# Patient Record
Sex: Male | Born: 1935 | Race: White | Hispanic: No | Marital: Married | Smoking: Former smoker
Health system: Southern US, Community
[De-identification: ages and names within clinical notes are randomized; demographics above are authoritative.]

## PROBLEM LIST (undated history)

## (undated) DIAGNOSIS — Z853 Personal history of malignant neoplasm of breast: Secondary | ICD-10-CM

## (undated) DIAGNOSIS — E785 Hyperlipidemia, unspecified: Secondary | ICD-10-CM

## (undated) DIAGNOSIS — Z95810 Presence of automatic (implantable) cardiac defibrillator: Secondary | ICD-10-CM

## (undated) DIAGNOSIS — I10 Essential (primary) hypertension: Secondary | ICD-10-CM

## (undated) DIAGNOSIS — I252 Old myocardial infarction: Secondary | ICD-10-CM

## (undated) DIAGNOSIS — I509 Heart failure, unspecified: Secondary | ICD-10-CM

## (undated) DIAGNOSIS — I4891 Unspecified atrial fibrillation: Secondary | ICD-10-CM

## (undated) DIAGNOSIS — M1990 Unspecified osteoarthritis, unspecified site: Secondary | ICD-10-CM

## (undated) DIAGNOSIS — D693 Immune thrombocytopenic purpura: Secondary | ICD-10-CM

## (undated) DIAGNOSIS — IMO0001 Reserved for inherently not codable concepts without codable children: Secondary | ICD-10-CM

## (undated) DIAGNOSIS — I2589 Other forms of chronic ischemic heart disease: Secondary | ICD-10-CM

## (undated) DIAGNOSIS — I2510 Atherosclerotic heart disease of native coronary artery without angina pectoris: Secondary | ICD-10-CM

## (undated) DIAGNOSIS — Z1501 Genetic susceptibility to malignant neoplasm of breast: Secondary | ICD-10-CM

## (undated) DIAGNOSIS — C50919 Malignant neoplasm of unspecified site of unspecified female breast: Secondary | ICD-10-CM

## (undated) DIAGNOSIS — Z1509 Genetic susceptibility to other malignant neoplasm: Principal | ICD-10-CM

## (undated) HISTORY — DX: Malignant neoplasm of unspecified site of unspecified female breast: C50.919

## (undated) HISTORY — PX: HIP ARTHROPLASTY: SHX981

## (undated) HISTORY — PX: CORONARY ARTERY BYPASS GRAFT: SHX141

## (undated) HISTORY — DX: Essential (primary) hypertension: I10

## (undated) HISTORY — PX: JOINT REPLACEMENT: SHX530

## (undated) HISTORY — PX: MASTECTOMY, PARTIAL: SHX709

## (undated) HISTORY — DX: Genetic susceptibility to other malignant neoplasm: Z15.09

## (undated) HISTORY — PX: CARDIAC CATHETERIZATION: SHX172

## (undated) HISTORY — DX: Immune thrombocytopenic purpura: D69.3

## (undated) HISTORY — PX: EYE SURGERY: SHX253

## (undated) HISTORY — DX: Genetic susceptibility to malignant neoplasm of breast: Z15.01

## (undated) HISTORY — DX: Heart failure, unspecified: I50.9

---

## 1997-09-13 ENCOUNTER — Other Ambulatory Visit: Admission: RE | Admit: 1997-09-13 | Discharge: 1997-09-13 | Payer: Self-pay | Admitting: Otolaryngology

## 1997-12-09 ENCOUNTER — Ambulatory Visit (HOSPITAL_COMMUNITY): Admission: RE | Admit: 1997-12-09 | Discharge: 1997-12-09 | Payer: Self-pay | Admitting: Gastroenterology

## 1999-02-27 ENCOUNTER — Encounter: Admission: RE | Admit: 1999-02-27 | Discharge: 1999-05-28 | Payer: Self-pay | Admitting: Cardiology

## 2000-10-31 ENCOUNTER — Encounter (HOSPITAL_COMMUNITY): Admission: RE | Admit: 2000-10-31 | Discharge: 2000-11-30 | Payer: Self-pay | Admitting: Oncology

## 2000-10-31 ENCOUNTER — Encounter: Admission: RE | Admit: 2000-10-31 | Discharge: 2000-10-31 | Payer: Self-pay | Admitting: Oncology

## 2001-03-29 ENCOUNTER — Encounter: Payer: Self-pay | Admitting: Orthopedic Surgery

## 2001-03-29 ENCOUNTER — Inpatient Hospital Stay (HOSPITAL_COMMUNITY): Admission: RE | Admit: 2001-03-29 | Discharge: 2001-04-03 | Payer: Self-pay | Admitting: Orthopedic Surgery

## 2001-08-15 ENCOUNTER — Encounter: Admission: RE | Admit: 2001-08-15 | Discharge: 2001-08-15 | Payer: Self-pay | Admitting: Oncology

## 2001-08-15 ENCOUNTER — Encounter (HOSPITAL_COMMUNITY): Admission: RE | Admit: 2001-08-15 | Discharge: 2001-09-14 | Payer: Self-pay | Admitting: Oncology

## 2001-11-06 ENCOUNTER — Encounter (HOSPITAL_COMMUNITY): Admission: RE | Admit: 2001-11-06 | Discharge: 2001-12-06 | Payer: Self-pay | Admitting: Oncology

## 2001-11-06 ENCOUNTER — Encounter: Admission: RE | Admit: 2001-11-06 | Discharge: 2001-11-06 | Payer: Self-pay | Admitting: Oncology

## 2002-01-17 ENCOUNTER — Inpatient Hospital Stay (HOSPITAL_COMMUNITY): Admission: RE | Admit: 2002-01-17 | Discharge: 2002-01-21 | Payer: Self-pay | Admitting: Orthopedic Surgery

## 2002-01-17 ENCOUNTER — Encounter: Payer: Self-pay | Admitting: Orthopedic Surgery

## 2002-06-04 ENCOUNTER — Encounter: Admission: RE | Admit: 2002-06-04 | Discharge: 2002-06-04 | Payer: Self-pay | Admitting: Oncology

## 2002-06-04 ENCOUNTER — Encounter (HOSPITAL_COMMUNITY): Admission: RE | Admit: 2002-06-04 | Discharge: 2002-07-04 | Payer: Self-pay | Admitting: Oncology

## 2002-12-10 ENCOUNTER — Encounter: Admission: RE | Admit: 2002-12-10 | Discharge: 2002-12-10 | Payer: Self-pay | Admitting: Oncology

## 2002-12-10 ENCOUNTER — Encounter (HOSPITAL_COMMUNITY): Admission: RE | Admit: 2002-12-10 | Discharge: 2002-12-20 | Payer: Self-pay | Admitting: Oncology

## 2002-12-12 ENCOUNTER — Encounter (HOSPITAL_COMMUNITY): Payer: Self-pay | Admitting: Oncology

## 2002-12-12 ENCOUNTER — Encounter: Admission: RE | Admit: 2002-12-12 | Discharge: 2002-12-12 | Payer: Self-pay | Admitting: Oncology

## 2002-12-31 ENCOUNTER — Ambulatory Visit (HOSPITAL_COMMUNITY): Admission: RE | Admit: 2002-12-31 | Discharge: 2002-12-31 | Payer: Self-pay | Admitting: Gastroenterology

## 2003-12-10 ENCOUNTER — Encounter (HOSPITAL_COMMUNITY): Admission: RE | Admit: 2003-12-10 | Discharge: 2003-12-20 | Payer: Self-pay | Admitting: Oncology

## 2003-12-10 ENCOUNTER — Encounter: Admission: RE | Admit: 2003-12-10 | Discharge: 2003-12-20 | Payer: Self-pay | Admitting: Oncology

## 2004-12-08 ENCOUNTER — Ambulatory Visit (HOSPITAL_COMMUNITY): Payer: Self-pay | Admitting: Oncology

## 2004-12-08 ENCOUNTER — Encounter: Admission: RE | Admit: 2004-12-08 | Discharge: 2004-12-19 | Payer: Self-pay | Admitting: Oncology

## 2004-12-08 ENCOUNTER — Encounter (HOSPITAL_COMMUNITY): Admission: RE | Admit: 2004-12-08 | Discharge: 2004-12-19 | Payer: Self-pay | Admitting: Oncology

## 2004-12-30 ENCOUNTER — Encounter: Admission: RE | Admit: 2004-12-30 | Discharge: 2004-12-30 | Payer: Self-pay | Admitting: Oncology

## 2005-02-26 ENCOUNTER — Ambulatory Visit: Payer: Self-pay | Admitting: Physical Medicine & Rehabilitation

## 2005-02-26 ENCOUNTER — Inpatient Hospital Stay (HOSPITAL_COMMUNITY): Admission: RE | Admit: 2005-02-26 | Discharge: 2005-03-06 | Payer: Self-pay | Admitting: Orthopedic Surgery

## 2005-03-04 ENCOUNTER — Ambulatory Visit: Payer: Self-pay | Admitting: Oncology

## 2005-05-31 ENCOUNTER — Ambulatory Visit (HOSPITAL_COMMUNITY): Payer: Self-pay | Admitting: Oncology

## 2005-05-31 ENCOUNTER — Encounter: Admission: RE | Admit: 2005-05-31 | Discharge: 2005-05-31 | Payer: Self-pay | Admitting: Oncology

## 2005-05-31 ENCOUNTER — Encounter (HOSPITAL_COMMUNITY): Admission: RE | Admit: 2005-05-31 | Discharge: 2005-06-30 | Payer: Self-pay | Admitting: Oncology

## 2006-01-31 ENCOUNTER — Encounter: Admission: RE | Admit: 2006-01-31 | Discharge: 2006-01-31 | Payer: Self-pay | Admitting: Oncology

## 2006-01-31 ENCOUNTER — Ambulatory Visit (HOSPITAL_COMMUNITY): Payer: Self-pay | Admitting: Oncology

## 2006-01-31 ENCOUNTER — Encounter (HOSPITAL_COMMUNITY): Admission: RE | Admit: 2006-01-31 | Discharge: 2006-03-02 | Payer: Self-pay | Admitting: Oncology

## 2006-03-18 ENCOUNTER — Encounter (HOSPITAL_COMMUNITY): Admission: RE | Admit: 2006-03-18 | Discharge: 2006-03-21 | Payer: Self-pay | Admitting: Oncology

## 2006-05-11 ENCOUNTER — Ambulatory Visit (HOSPITAL_COMMUNITY): Payer: Self-pay | Admitting: Oncology

## 2006-05-11 ENCOUNTER — Encounter (HOSPITAL_COMMUNITY): Admission: RE | Admit: 2006-05-11 | Discharge: 2006-06-10 | Payer: Self-pay | Admitting: Oncology

## 2007-01-30 ENCOUNTER — Ambulatory Visit (HOSPITAL_COMMUNITY): Payer: Self-pay | Admitting: Oncology

## 2007-04-04 ENCOUNTER — Inpatient Hospital Stay (HOSPITAL_COMMUNITY): Admission: AD | Admit: 2007-04-04 | Discharge: 2007-04-07 | Payer: Self-pay | Admitting: Cardiology

## 2007-04-05 ENCOUNTER — Encounter (INDEPENDENT_AMBULATORY_CARE_PROVIDER_SITE_OTHER): Payer: Self-pay | Admitting: Cardiology

## 2007-06-01 ENCOUNTER — Ambulatory Visit (HOSPITAL_COMMUNITY): Admission: RE | Admit: 2007-06-01 | Discharge: 2007-06-01 | Payer: Self-pay | Admitting: Cardiology

## 2007-07-20 ENCOUNTER — Ambulatory Visit (HOSPITAL_COMMUNITY): Admission: RE | Admit: 2007-07-20 | Discharge: 2007-07-20 | Payer: Self-pay | Admitting: Cardiology

## 2007-07-24 ENCOUNTER — Encounter: Admission: RE | Admit: 2007-07-24 | Discharge: 2007-07-24 | Payer: Self-pay | Admitting: Cardiology

## 2007-09-28 ENCOUNTER — Inpatient Hospital Stay (HOSPITAL_BASED_OUTPATIENT_CLINIC_OR_DEPARTMENT_OTHER): Admission: RE | Admit: 2007-09-28 | Discharge: 2007-09-28 | Payer: Self-pay | Admitting: Cardiology

## 2007-10-27 ENCOUNTER — Ambulatory Visit: Payer: Self-pay | Admitting: Internal Medicine

## 2007-11-15 ENCOUNTER — Ambulatory Visit: Payer: Self-pay | Admitting: Internal Medicine

## 2007-11-15 LAB — CONVERTED CEMR LAB
BUN: 29 mg/dL — ABNORMAL HIGH (ref 6–23)
CO2: 30 meq/L (ref 19–32)
Calcium: 8.6 mg/dL (ref 8.4–10.5)
Eosinophils Relative: 2.9 % (ref 0.0–5.0)
GFR calc Af Amer: 51 mL/min
Glucose, Bld: 123 mg/dL — ABNORMAL HIGH (ref 70–99)
HCT: 35.5 % — ABNORMAL LOW (ref 39.0–52.0)
Hemoglobin: 12.2 g/dL — ABNORMAL LOW (ref 13.0–17.0)
INR: 2.1 — ABNORMAL HIGH (ref 0.8–1.0)
Lymphocytes Relative: 17.5 % (ref 12.0–46.0)
Monocytes Absolute: 0.5 10*3/uL (ref 0.1–1.0)
Monocytes Relative: 9.2 % (ref 3.0–12.0)
Neutro Abs: 3.6 10*3/uL (ref 1.4–7.7)
Prothrombin Time: 22.9 s — ABNORMAL HIGH (ref 10.9–13.3)
RBC: 3.55 M/uL — ABNORMAL LOW (ref 4.22–5.81)
Sodium: 144 meq/L (ref 135–145)
WBC: 5.1 10*3/uL (ref 4.5–10.5)

## 2007-11-22 ENCOUNTER — Inpatient Hospital Stay (HOSPITAL_COMMUNITY): Admission: RE | Admit: 2007-11-22 | Discharge: 2007-11-23 | Payer: Self-pay | Admitting: Internal Medicine

## 2007-11-22 ENCOUNTER — Ambulatory Visit: Payer: Self-pay | Admitting: Internal Medicine

## 2007-12-14 ENCOUNTER — Ambulatory Visit: Payer: Self-pay | Admitting: Internal Medicine

## 2007-12-14 ENCOUNTER — Ambulatory Visit: Payer: Self-pay

## 2008-04-30 ENCOUNTER — Encounter: Payer: Self-pay | Admitting: Internal Medicine

## 2008-05-14 ENCOUNTER — Ambulatory Visit: Payer: Self-pay | Admitting: Internal Medicine

## 2008-11-21 DIAGNOSIS — I10 Essential (primary) hypertension: Secondary | ICD-10-CM | POA: Insufficient documentation

## 2008-11-21 DIAGNOSIS — I2589 Other forms of chronic ischemic heart disease: Secondary | ICD-10-CM | POA: Insufficient documentation

## 2008-11-21 DIAGNOSIS — I4891 Unspecified atrial fibrillation: Secondary | ICD-10-CM | POA: Insufficient documentation

## 2008-11-21 DIAGNOSIS — I5022 Chronic systolic (congestive) heart failure: Secondary | ICD-10-CM

## 2008-11-21 DIAGNOSIS — M1990 Unspecified osteoarthritis, unspecified site: Secondary | ICD-10-CM | POA: Insufficient documentation

## 2008-11-21 DIAGNOSIS — M109 Gout, unspecified: Secondary | ICD-10-CM

## 2008-11-22 ENCOUNTER — Ambulatory Visit: Payer: Self-pay | Admitting: Internal Medicine

## 2008-11-25 DIAGNOSIS — Z95810 Presence of automatic (implantable) cardiac defibrillator: Secondary | ICD-10-CM

## 2008-11-25 HISTORY — DX: Presence of automatic (implantable) cardiac defibrillator: Z95.810

## 2009-12-23 ENCOUNTER — Ambulatory Visit: Payer: Self-pay | Admitting: Internal Medicine

## 2010-03-26 ENCOUNTER — Ambulatory Visit: Admit: 2010-03-26 | Payer: Self-pay | Admitting: Internal Medicine

## 2010-03-31 ENCOUNTER — Encounter (INDEPENDENT_AMBULATORY_CARE_PROVIDER_SITE_OTHER): Payer: Self-pay | Admitting: *Deleted

## 2010-04-21 NOTE — Assessment & Plan Note (Signed)
Summary: df2   Visit Type:  follow-up Primary Provider:  Pete Glatter, MD   History of Present Illness: Martin [name] returns today for ICD followup.  He is a 77 yo man with a h/o chronic atrial fibrillation and an ICM.  He has class 1-2 CHF and is s/p prophylactic ICD implantation.  He denies c/p or sob.  He is able to walk up multiple flights of stairs without much difficulty.  He denies c/p, peripheral edema or any intercurrent ICD shocks.  Back in May his optivol was up and he notes that his diuretic dose was adjusted.  Current Medications (verified): 1)  Potassium Chloride Crys Cr 20 Meq Cr-Tabs (Potassium Chloride Crys Cr) .... Take One Tablet By Mouth Daily 2)  Allopurinol 300 Mg Tabs (Allopurinol) .Marland Kitchen.. 1 By Mouth Once Daily 3)  Crestor 20 Mg Tabs (Rosuvastatin Calcium) .... Take One Tablet By Mouth Daily. 4)  Colchicine 0.6 Mg Tabs (Colchicine) .Marland Kitchen.. 1 By Mouth Once Daily 5)  Furosemide 40 Mg Tabs (Furosemide) .Marland Kitchen.. 1 Tab By Mouth Two Times A Day 6)  Aspirin 81 Mg Tbec (Aspirin) .... Take One Tablet By Mouth Daily 7)  Metoprolol Tartrate 50 Mg Tabs (Metoprolol Tartrate) .... Take One Tablet By Mouth Twice A Day 8)  Terazosin Hcl 5 Mg Caps (Terazosin Hcl) .Marland Kitchen.. 1 By Mouth Once Daily 9)  Warfarin Sodium 5 Mg Tabs (Warfarin Sodium) .... Use As Directed By Anticoagulation Clinic 10)  Vitamin C 500 Mg Tabs (Ascorbic Acid) .Marland Kitchen.. 1 By Mouth Once Daily 11)  Ferrous Sulfate 324 Mg Tabs (Ferrous Sulfate) .Marland Kitchen.. 1 By Mouth Once Daily 12)  Multivitamins  Tabs (Multiple Vitamin) .Marland Kitchen.. 1 By Mouth Once Daily 13)  Diovan 160 Mg Tabs (Valsartan) .... Take One Tablet By Mouth Daily  Allergies (verified): No Known Drug Allergies  Past History:  Past Medical History: Last updated: 11/21/2008 HYPERTENSION (ICD-401.9) GOUT (ICD-274.9) OSTEOARTHRITIS (ICD-715.90) ATRIAL FIBRILLATION, CHRONIC (ICD-427.31) CHRONIC SYSTOLIC HEART FAILURE (ICD-428.22) CARDIOMYOPATHY, ISCHEMIC (ICD-414.8) CORONARY ARTERY BYPASS  GRAFT, HX OF (ICD-V45.81) MYOCARDIAL INFARCTION, HX OF (ICD-412)  Past Surgical History: Last updated: 11/21/2008 triple bypass in June 1985. Radical mastectomy of the left breast in 1991. Angioplasty in 1995. Right total knee arthroplasty in January 2003. Left total knee arthroplasty in October 2003.  Review of Systems  The patient denies chest pain, syncope, dyspnea on exertion, and peripheral edema.    Vital Signs:  Patient profile:   77 year old male Height:      70 inches Weight:      215 pounds BMI:     30.96 Pulse rate:   77 / minute BP sitting:   120 / 82  (left arm)  Vitals Entered By: Laurance Flatten CMA (December 23, 2009 10:57 AM)  Physical Exam  General:  Well developed, well nourished, in no acute distress. Head:  normocephalic and atraumatic Eyes:  PERRLA/EOM intact; conjunctiva and lids normal. Mouth:  Teeth, gums and palate normal. Oral mucosa normal. Neck:  Neck supple, no JVD. No masses, thyromegaly or abnormal cervical nodes. Chest Wall:  Well healed ICD incision. Lungs:  Clear bilaterally to auscultation with no wheezes, rales, or rhonchi. Heart:  IRIR with normal S1 and S2.  PMI is enlarged and laterally displaced.  No murmurs were appreciated. Abdomen:  Bowel sounds positive; abdomen soft and non-tender without masses, organomegaly, or hernias noted. No hepatosplenomegaly. Msk:  Back normal, normal gait. Muscle strength and tone normal. Pulses:  pulses normal in all 4 extremities Extremities:  No clubbing  or cyanosis. Neurologic:  Alert and oriented x 3.    ICD Specifications Following MD:  Lewayne Bunting, MD     ICD Vendor:  Medtronic     ICD Model Number:  D154VWC     ICD Serial Number:  [id number] ICD DOI:  11/22/2007      Lead 1:    Location: RV     DOI: 11/22/2007     Model #: [id number]     Serial #: [id number]     Status: active  Indications::  ICM   ICD Follow Up Battery Voltage:  3.19 V     Charge Time:  8.9 seconds     Underlying rhythm:   SR   ICD Device Measurements Right Ventricle:  Amplitude: 18.6 mV, Impedance: 408 ohms, Threshold: 1.0 V at 0.4 msec Shock Impedance: 64 ohms   Episodes MS Episodes:  0     Coumadin:  Yes Shock:  0     ATP:  0     Nonsustained:  0     Atrial Therapies:  0 Ventricular Pacing:  <0.1%  Brady Parameters Mode VVI     Lower Rate Limit:  40      Tachy Zones VF:  200     VT:  150     Next Remote Date:  03/26/2010     Next Cardiology Appt Due:  12/21/2010 Tech Comments:  NORMAL DEVICE FUNCTION.  NO EPISODES SINCE LAST CHECK.  OPTIVOL INCREASE FROM 4-16 THRU 6-3.  CHANGED RV OUTPUT FROM 3.0 TO 2.5 AND CHANGED MAX LEAD IMPEDANCE FOR LIA.  CARELINK TRANSMISSION 03-26-10. ROV IN 12 MTHS W/GT. Vella Kohler  December 23, 2009 11:02 AM MD Comments:  Agree with above.  Impression & Recommendations:  Problem # 1:  AUTOMATIC IMPLANTABLE CARDIAC DEFIBRILLATOR SITU (ICD-V45.02) His device is working normally.  Will recheck in several months.  Problem # 2:  ATRIAL FIBRILLATION, CHRONIC (ICD-427.31) He is asymptomatic and his ventricular rate is controlled.  He will continue meds as below. His updated medication list for this problem includes:    Aspirin 81 Mg Tbec (Aspirin) .Marland Kitchen... Take one tablet by mouth daily    Metoprolol Tartrate 50 Mg Tabs (Metoprolol tartrate) .Marland Kitchen... Take one tablet by mouth twice a day    Warfarin Sodium 5 Mg Tabs (Warfarin sodium) ..... Use as directed by anticoagulation clinic  Problem # 3:  CHRONIC SYSTOLIC HEART FAILURE (ICD-428.22) His symptoms appear to be well controlled. A low sodium diet is requested. His updated medication list for this problem includes:    Furosemide 40 Mg Tabs (Furosemide) .Marland Kitchen... 1 tab by mouth two times a day    Aspirin 81 Mg Tbec (Aspirin) .Marland Kitchen... Take one tablet by mouth daily    Metoprolol Tartrate 50 Mg Tabs (Metoprolol tartrate) .Marland Kitchen... Take one tablet by mouth twice a day    Warfarin Sodium 5 Mg Tabs (Warfarin sodium) ..... Use as directed by  anticoagulation clinic    Diovan 160 Mg Tabs (Valsartan) .Marland Kitchen... Take one tablet by mouth daily  Patient Instructions: 1)  Your physician wants you to follow-up in: 12 months with Dr Court Joy will receive a reminder letter in the mail two months in advance. If you don't receive a letter, please call our office to schedule the follow-up appointment. 2)  03/26/10-  [facility] transmission

## 2010-04-21 NOTE — Cardiovascular Report (Signed)
Summary: Office Visit   Office Visit   Imported By: Roderic Ovens 12/23/2009 14:30:59  _____________________________________________________________________  External Attachment:    Type:   Image     Comment:   External Document

## 2010-04-23 NOTE — Letter (Signed)
Summary: Device-Delinquent Phone Journalist, newspaper, Main Office  [address]   [place], [place] [address]   Phone: [phone]  Fax: [phone]     March 31, 2010 MRN: [id number]   [employer] [address] [place], [place]  [address]   Dear Mr. Nicoll,  According to our records, you were scheduled for a device phone transmission on 03-26-2010.     We did not receive any results from this check.  If you transmitted on your scheduled day, please call us to help troubleshoot your system.  If you forgot to send your transmission, please send one upon receipt of this letter.  Thank you,   Architectural technologist [facility]

## 2010-08-04 NOTE — Op Note (Signed)
NAME:  Martin [name], Martin [name] NO.:  [id number]   MEDICAL RECORD NO.:  [id number]          PATIENT TYPE:  OIB   LOCATION:  2854                         FACILITY:  [facility]   PHYSICIAN:  Georga Hacking, M.D.DATE OF BIRTH:  09/18/1930   DATE OF PROCEDURE:  DATE OF DISCHARGE:                               OPERATIVE REPORT   PROCEDURE:  Cardioversion.   TECHNIQUE:  The patient was brought to the [facility] and he had  previously been on warfarin anticoagulation for greater than 1 month  with INR greater than 2.  Cardioversion was done with synchronized  biphasic defibrillation with reversion to sinus rhythm after 2 shocks.  Anesthesia was administered by Dr. Arta Bruce with 175 mg of Pentothal.  He converted to sinus with marked first degree AV block, right bundle  branch block, and left posterior hemiblock.  He tolerated the procedure  well.   IMPRESSION:  Successful cardioversion of atrial fibrillation following  failed attempts to hold in sinus rhythm previously on Tikosyn.  The  patient will be maintained on amiodarone.      Georga Hacking, M.D.  Electronically Signed     WST/MEDQ  D:  07/20/2007  T:  07/20/2007  Job:  [id number]   cc:   Hal T. Stoneking, M.D.

## 2010-08-04 NOTE — Discharge Summary (Signed)
NAME:  DANNA, CASELLA NO.:  [id number]   MEDICAL RECORD NO.:  [id number]          PATIENT TYPE:  INP   LOCATION:  3741                         FACILITY:  [facility]   PHYSICIAN:  Doylene Canning. Ladona Ridgel, MD    DATE OF BIRTH:  [birth year]   DATE OF ADMISSION:  11/22/2007  DATE OF DISCHARGE:  11/23/2007                               DISCHARGE SUMMARY   CARDIOLOGIST:  Georga Hacking, MD   PRIMARY CAREGIVER:  Hal T. Pete Glatter, MD   ONCOLOGIST:  Ladona Horns. Mariel Sleet, MD   ELECTROPHYSIOLOGIST:  Doylene Canning. Ladona Ridgel, MD   This patient has no known drug allergies.   TIME FOR THIS DICTATION:  Greater than 35 minutes.   FINAL DIAGNOSIS:  Discharged on day #1, status post implant of a  Medtronic VIRTUOSO-VR single chamber cardioverter defibrillator.  Defibrillator threshold study less than or equal to 20 joules, Dr. Lewayne Bunting.   SECONDARY DIAGNOSES:  1. History of anterior myocardial infarction, 1985.  Subsequent left      internal mammary artery graft to the left anterior descending      coronary artery, 1985.      a.     Stents to the right coronary artery, 1999.  2. Cardiomyopathy, ischemic with right bundle branch block.      a.     Catheterization, September 28, 2007, ejection fraction 20%-25%,       which shows a decline.  The left internal mammary artery  is       patent 30% in-stent restenosis in the right coronary artery.  3. New York Heart Association class II chronic systolic congestive      heart failure.  4. Chronic atrial fibrillation on Coumadin.      a.     Failed cardioversion on Tikosyn.      b.     Failed cardioversion on amiodarone.  5. Osteoarthritis.      a.     Status post bilateral total knee arthroplasties.      b.     Status post left total hip arthroplasty.  6. Left mastectomy for breast cancer.  7. Gout.  8. Hypertension.   PROCEDURE:  November 22, 2007 implant of the Medtronic VIRTUOSO  cardioverter-defibrillator.  This patient was admitted from  November 22, 2007 to November 23, 2007.   BRIEF HISTORY:  Mr. Tungate now is a 77 year old male referred by Dr.  Donnie Aho for evaluation of atrial fibrillation and consideration of ICD.   The patient has a longstanding history of coronary artery disease.  He  had myocardial infarction in 1985 with subsequent coronary artery bypass  graft surgery.  He recently had a catheterization in July 2009, which  showed ejection fraction of 20%-25%, which represents a decline in his  ejection fraction.  He has heart failure class II.  His activity is  limited mostly by osteoarthritis in the knees and in hip.   He is still able to push a lawnmower and riding mower without particular  difficulty  Her has never had syncope.   With respect to the patient's atrial  fibrillation, Dr. Ladona Ridgel  recommended Dronedarone, a multiclass antiarrhythmic drug.  He thought  this would be a consideration since chronic atrial fibrillation will  probably not respond to aggressive attempts for sinus rhythm  maintenance.  With regard to prophylactic protection against sudden  cardiac death, the patient has no indication for cardioverter-  defibrillator.  The plan for this patient would be to continue on  Coumadin throughout the days leading to and through the procedure.  The  risks and benefits of implantation of this device have been described to  the patient who wishes to proceed.   HOSPITAL COURSE:  The patient presents electively on November 22, 2007.  He underwent implantation of a single chamber device the same day.  He  had defibrillator threshold study, which was less than or equal to 20  joules and was effective in eliminating ventricular fibrillation, which  had been initiated in the Electrophysiology Lab by Dr. Ladona Ridgel.  The  patient has maintained on his Coumadin throughout this hospitalization.  The incision looks well with no hematoma.  On postprocedure day #1, the  chest x-ray showed the lead is in  appropriate position.  The device has  been interrogated and the values were all within normal limits.  The  patient is ready for discharge.   He hoes home on his regular medications.  They are:  1. Klor-Con 20 mEq daily.  2. Allopurinol 300 mg daily.  3. Crestor 20 mg daily at bedtime.  4. Colchicine 0.6 mg daily.  5. Lasix 40 mg daily.  6. Diovan 160 mg daily.  7. Enteric-coated aspirin 81 mg daily.  8. Metoprolol succinate 50 mg daily.  9. Terazosin 5 mg daily at night.  10.Coumadin 5 mg daily except for 8 mg on Mondays.   The patient has been asked to keep his incision dry for the next 7 days  until Wednesday, November 29, 2007.  Mobility of the left arm has been  discussed with the patient.  He has a followup at the [facility],  Wednesday, December 13, 2007 at 9:40.   Laboratory studies pertinent to this admission were drawn on November 15, 2007.  White cells 5.1, hemoglobin 12.2, hematocrit 35.5, platelets 103,  protime was 22.9, at that time INR 2.1.  Sodium 144, potassium is 4,  chloride 109, carbon dioxide or carbonate 30, glucose 123, BUN is 29,  creatinine 1.7.      Maple Mirza, PA      Doylene Canning. Ladona Ridgel, MD  Electronically Signed    GM/MEDQ  D:  11/23/2007  T:  11/24/2007  Job:  [id number]   cc:   Georga Hacking, M.D.  Hal T. Stoneking, M.D.  Ladona Horns. Mariel Sleet, MD  Doylene Canning. Ladona Ridgel, MD

## 2010-08-04 NOTE — Assessment & Plan Note (Signed)
[facility]                         ELECTROPHYSIOLOGY OFFICE NOTE   NAME:Martin [name]                       MRN:          [id number]  DATE:05/14/2008                            DOB:          1930-07-01    Martin [name] returns today for followup.  He is a very pleasant elderly male  with chronic atrial fibrillation and ischemic cardiomyopathy in class I  to II congestive heart failure who underwent ICD implantation back in  September.  At that time, he had a Medtronic Virtuoso placed and he  returns today for followup.  He had no specific complaints today.  He  notes that he would like to have more energy, but realizes that he is  not as young as he use to be and that he has a weakened heart and AFib.   MEDICINES:  1. Potassium 20 a day.  2. Allopurinol 300 a day.  3. Crestor 20 a day.  4. Colchicine 0.6 a day.  5. Furosemide 40 twice a day.  6. Aspirin 81 daily.  7. Metoprolol 50 a day.  8. Terazosin 5 mg daily.  9. Warfarin 5 mg daily.  10.Multiple vitamins.   PHYSICAL EXAMINATION:  GENERAL:  On exam, he is a pleasant, well-  appearing man, in no acute distress.  VITAL SIGNS:  Blood pressure was 110/73, the pulse was 59 and irregular,  respirations were 18.  The weight was 216 pounds.  NECK:  No jugular  venous distention.  LUNGS:  Clear bilaterally to auscultation.  No wheezes, rales, or  rhonchi are present.  No increased work of breathing.  CARDIOVASCULAR:  Irregular rhythm with normal S1 and S2.  The PMI was  enlarged and laterally displaced.  ABDOMEN:  Soft and nontender.  EXTREMITIES:  Demonstrated no peripheral edema.   Interrogation of his defibrillator demonstrates Medtronic Virtuoso.  The  R-waves are 18.  The impedance was 400.  The threshold volt 0.4.  Battery voltage was 3.2 volts.  Underlying rhythm was AFib at 60.   IMPRESSION:  1. Ischemic cardiomyopathy, ejection fraction 25%.  2. Congestive heart failure, class II.  3.  Atrial fibrillation.  4. Status post implantable cardioverter-defibrillator insertion.   DISCUSSION:  Overall, Martin [name] is stable and his defibrillator is  working normally.  He is maintaining atrial fibrillation which he will  likely do indefinitely, as he has failed antiarrhythmic drug therapy in  the past.  I will plan to see the patient back in the office for  followup of his ICD.  He is instructed to maintain a low-salt diet.  No  medicine changes were made today.     Doylene Canning. Ladona Ridgel, MD  Electronically Signed    GWT/MedQ  DD: 05/14/2008  DT: 05/15/2008  Job #: [id number]   cc:   Georga Hacking, M.D.

## 2010-08-04 NOTE — Op Note (Signed)
NAME:  Martin [name], Martin [name] NO.:  [id number]   MEDICAL RECORD NO.:  [id number]          PATIENT TYPE:  INP   LOCATION:  3741                         FACILITY:  [facility]   PHYSICIAN:  Doylene Canning. Ladona Ridgel, MD    DATE OF BIRTH:  02/20/1931   DATE OF PROCEDURE:  DATE OF DISCHARGE:                               OPERATIVE REPORT   PREPROCEDURE DIAGNOSES:  1. Ischemic cardiomyopathy.  2. New York Heart Association class II heart failure.  3. Chronic atrial fibrillation.   Postprocedure Diagnosis  Same   PROCEDURES:  1. Implantable cardioverter-defibrillator implantation.  2. Defibrillation threshold testing  3. Contrast venography.   DESCRIPTION OF THE PROCEDURE:  Informed written consent was obtained and  the patient was brought to the electrophysiology lab in a fasting state.  He was adequately sedative with intravenous medications as outlined in  the nursing report.  The patient's left chest was prepped and draped in  the usual sterile fashion by the EP lab staff.  A 6-cm incision was made  over the left deltopectoral region.  Using a combination of sharp and  blunt dissection, an ICD pocket was fashioned in the standard manner.  Careful attention was used to maintain hemostasis throughout.  A  venogram of the left upper extremity was then performed by hand  injection of nonionic contrast, which demonstrated a patent left  axillary and subclavian vein.  The left axillary vein was cannulated  using a modified percutaneous Seldinger technique and a 9-French  SafeSheath was introduced into the left axillary vein.  A Medtronic  Sprint Quattro Secure S model [id number] (serial # [id number]) lead was  advanced through this vein into the low right ventricular septum near  the apex and actively fixed in this location.  Lead measurements  revealed an R-wave of 23.7 mV with an impedance 713 ohms and a pacing  threshold of 0.9 volts at 0.5 msec.  The lead was then secured to the  pectoralis fascia with 0 silk suture over the suture sleeve.  The lead  was then connected to a Medtronic Virtuoso VR model D15VWC (serial #  [id number]) single chamber ICD.  The pocket was then copiously irrigated  with a gentamicin solution.  The ICD pocket was then closed in 2 layers  with Vicryl 2.0 suture used for the subcutaneous and subcuticular  layers.  Steri-Strips and a sterile bandages were then applied.  DFT  testing was then performed.  Ventricular fibrillation was induced with 2  shocks.  Adequate sensing of ventricular fibrillation was observed from  the device with a sensitivity program to 1.2 mV.  A 10-joule shock was  delivered from the device, which failed to defibrillate the patient.  A  20-joule shock was then delivered from the device, which successfully  defibrillated the patient.  The impedance was 62 ohms with a duration of  7 seconds.  The patient remained in atrial fibrillation.  There were no  early apparent complications.   CONCLUSIONS:  1. Ischemic cardiomyopathy.  2. Congestive heart failure with New York Heart Association class II  heart failure symptoms.  3. Successful single chamber ICD implantation.  4. DFT less than or equal to 20 joules.  5. No early apparent complications.      Doylene Canning. Ladona Ridgel, MD  Electronically Signed     GWT/MEDQ  D:  11/22/2007  T:  11/23/2007  Job:  [id number]   cc:   Georga Hacking, M.D.

## 2010-08-04 NOTE — Assessment & Plan Note (Signed)
HEALTHCARE                         ELECTROPHYSIOLOGY OFFICE NOTE   NAME:BALLDalbert, Stillings                       MRN:          [id number]  DATE:10/27/2007                            DOB:          May 05, 1930    [name] is referred today by Dr. Viann Fish for evaluation of  atrial fibrillation and consideration for ICD implantation.  The patient  is a very pleasant 77 year old man with a longstanding history of  coronary artery disease status post myocardial infarction in 1985.  He  has had known LV dysfunction with EF in the 40-45% range, but recently  EF was found to be down in the 25-30% range.  This is by  catheterization.  He is referred for this evaluation.  His heart failure  is class II.  He is limited mostly by arthritis and knee replacements in  his knees bilaterally as well as one hip.  He did not have angina.  He  has rare palpitations.  He does have some mild dyspnea.  The patient  does too much strenuous activity like mowing his grass.  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  He has never had frank syncope.   His medicines include potassium 20 a day, allopurinol 300 a day, Crestor  20 a day, colchicine 0.6 daily, furosemide 40 twice daily, aspirin 81  daily, metoprolol 50 daily, terazosin 5 daily, warfarin daily, and  multiple vitamins.  The patient has not been tolerant of ACE inhibitors  in the past.  His family history is noncontributory.   SOCIAL HISTORY:  The patient is married.  He denies tobacco or ethanol  abuse.  He stopped smoking in 1966.  He drinks approximately 4 ounces of  alcohol per day.   Past medical history is notable for a bypass surgery in June 1985.  He  has a history of mastectomy on the left secondary to breast cancer.  He  has a history of knee replacement.  He has a history of hip replacement.  He has a history of arthritis and gout.  His  family history is notable  for mother died in her 36s as well as her father.  The mother had  cancer.  The father with heart failure.   His review of systems is as noted in the HPI, otherwise all symptoms are  negative except for some dysuria problems which are very mild.   On physical examination, he is a pleasant well-appearing 77 year old man  in no acute distress.  The initial blood pressure was 134/80.  The pulse  was 82 and regular.  The respirations were 18.  The weight was 211  pounds.  HEENT exam was normocephalic and atraumatic.  Pupils equal and  round.  The oropharynx was moist.  Sclerae anicteric.  Neck revealed no  jugular venous distention.  There was no thyromegaly.  The trachea was  midline.  The carotids were 2+ and symmetric.  The lungs were clear  bilaterally on auscultation.  No wheezes, rales, or rhonchi were  present.  There was no increased work of breathing.  The cardiovascular  exam revealed an irregular rhythm with normal S1and S2.  The PMI was  enlarged and laterally displaced.  I did not appreciate any murmur  today.  Abdominal exam was soft and nontender.  There was no  organomegaly.  The bowel sounds were present.  There was no rebound or  guarding.  Extremities demonstrated no cyanosis, clubbing, or edema.  The pulses were 2+ and symmetric.  Neurologic exam, alert and oriented  x3.  His cranial nerves were intact.  Strength was 5/5 and symmetric.   EKG demonstrates atrial fibrillation with a controlled ventricular  response and a right bundle-branch block pattern.  There was anterior Q-  waves from V3 through the V6 consistent with old anterior myocardial  infarction.  His QRS duration was 136 milliseconds.   IMPRESSION:  1. Coronary artery disease status post anterior myocardial infarction      with ejection fraction of 25-30%.  2. Congestive heart failure class II with his limitations being that      of his arthritis and knee replacements.  3.  Chronic atrial fibrillation having failed Tikosyn and amiodarone in      the past with really only mild symptoms of atrial fibrillation.      Specifically the patient notes that he felt a little bit better in      sinus rhythm, but not particularly so, and is able to do mostly      what he wants to do at present.  4. Multiple knee replacements.  5. History of hypertension.   DISCUSSION:  I have discussed the treatment options with Mr. Taboada.  With  regard to his atrial fibrillation, I think that well dronedarone, the  new multiclass antiarrhythmic drug, would be a consideration for him as  well as I think that the likelihood is that he has chronic AFib and  additional aggressive attempts for sinus rhythm maintenance will be very  unlikely to be fruitful.  With regard to prophylactic defibrillator  implant, he has an indication for ICD.  When I initially saw him in the  office today, it was unclear to me what exactly his LV function was, but  I suspect that it is closer to 30% rather than 40% which had been  previously documented with 25-30% EF based on catheterization.  With  this in mind, we will plan on proceeding with a prophylactic ICD  insertion.  I would like to keep him on his Coumadin while we plan to do  this, though if his INR drift down to the low 2 range or even the high 1  range this will be satisfactory to me.  We have discussed the issues,  indications, and risks of ICD implantation and he would like to proceed  with this and we will plan on trying to schedule this at the earliest  possible convenient time.     Doylene Canning. Ladona Ridgel, MD  Electronically Signed    GWT/MedQ  DD: 10/27/2007  DT: 10/28/2007  Job #: [id number]   cc:   Georga Hacking, M.D.

## 2010-08-04 NOTE — Cardiovascular Report (Signed)
NAME:  Martin [name], Martin [name] NO.:  [id number]   MEDICAL RECORD NO.:  [id number]          PATIENT TYPE:  OIB   LOCATION:  1962                         FACILITY:  [facility]   PHYSICIAN:  Georga Hacking, M.D.DATE OF BIRTH:  1930/03/28   DATE OF PROCEDURE:  09/28/2007  DATE OF DISCHARGE:  09/28/2007                            CARDIAC CATHETERIZATION   REASON FOR CATHETERIZATION:  Congestive heart failure, previous bypass  grafting.   HISTORY:  The patient is a 77 year old male who has had previous  anterior MI and previous bypass grafting with a single internal mammary  graft to LAD.  He had a PTCA of the right coronary artery in 2 places in  1995.  He presented with recent onset of atrial fibrillation and  congestive heart failure.  He has continued to complain of symptoms of  atrial fibrillation, has failed cardioversion twice now after being  loaded with Tikosyn and amiodarone.  He is now on chronic atrial  fibrillation, study is done for evaluation of etiology of heart failure.   PROCEDURE:  Left heart catheterization with coronary angiograms and left  ventriculogram.   PROCEDURE:  Procedure was done in the outpatient diagnostic laboratory.  He was given Mucomyst 600 mg p.o. and some IV hydration.  He was prepped  and draped in usual manner of the right femoral artery with introducing  an anterior needle wall stick.  The right coronary was selected using a  standard right catheter, was unable to advance the 4-French catheter  into the left subclavian artery, despite placement of a guidewire deep  down the artery because of lack of backup.  Flush injections were made,  however, showing the internal mammary graft to be patent to a previously  infarcted site.  The sheath was removed following the procedure.  He  tolerated it well.   HEMODYNAMIC DATA:  Aorta postcontrast, 126/86; LV postcontrast, 126/22-  26.   ANGIOGRAPHIC DATA:  Left ventriculogram:  Performed in the  30-degree RAO  projection.  The aortic valve is normal.  The mitral valve is normal.  There is mild mitral regurgitation noted.  Left ventricle is dilated.  There is akinesis of the anterior wall and dyskinesis of the apex.  There is hypokinesis inferiorly.  Estimated ejection fraction is 20-25%.  Coronary arteries arise and distribute normally.  Left main coronary has  irregularities and is mildly calcified.  The LAD is occluded and a  diagonal branch fills from collaterals from the internal mammary graft  in a bidirectional flow.  The circumflex has no significant stenoses.  The right coronary artery is a large dominant vessel with posterior  descending and posterolateral branch.  There was 30% stenosis of the  previous angioplasty sites with no severe obstructive stenoses noted.   IMPRESSION:  1. Interval reduction in left ventricular ejection fraction now at 20-      25%.  2. Coronary artery disease with patent internal mammary graft to left      anterior descending and occlusion of native      the left anterior descending.  3. Mild-to-moderate disease involving the  right coronary, minimal      disease involving circumflex.   RECOMMENDATIONS:  Consideration of biventricular ICD.      Georga Hacking, M.D.  Electronically Signed     WST/MEDQ  D:  09/28/2007  T:  09/29/2007  Job:  [id number]   cc:   Duke Salvia, MD, [facility]  Hal T. Stoneking, M.D.

## 2010-08-04 NOTE — Op Note (Signed)
NAME:  TRAI, ELLS NO.:  [id number]   MEDICAL RECORD NO.:  [id number]          PATIENT TYPE:  INP   LOCATION:  2036                         FACILITY:  [facility]   PHYSICIAN:  Georga Hacking, M.D.DATE OF BIRTH:  [birth year]   DATE OF PROCEDURE:  04/06/2007  DATE OF DISCHARGE:                               OPERATIVE REPORT   PROCEDURE:  Cardioversion.   HISTORY:  77 year old male with previous coronary artery disease,  previous anterior infarction and previous diagnosis of atrial  fibrillation that has been symptomatic.  He failed to convert after been  initiated on Tikosyn.   PROCEDURE:  Cardioversion.  The patient was n.p.o. after midnight and  anesthesia was administered by Dr. Diamantina Monks with 120 mg of Pentothal  intravenously.  Cardioversion was done with synchronized biphasic  defibrillation with 75 watts resulting in reversion to sinus rhythm.  He  had tolerated the procedure well.   IMPRESSION:  1. Successful cardioversion of atrial fibrillation.      Georga Hacking, M.D.  Electronically Signed     WST/MEDQ  D:  04/06/2007  T:  04/06/2007  Job:  [id number]   cc:   Hal T. Stoneking, M.D.

## 2010-08-04 NOTE — Discharge Summary (Signed)
NAME:  Martin [name], PATIL NO.:  [id number]   MEDICAL RECORD NO.:  [id number]          PATIENT TYPE:  INP   LOCATION:  2036                         FACILITY:  [facility]   PHYSICIAN:  Georga Hacking, M.D.DATE OF BIRTH:  02-20-1931   DATE OF ADMISSION:  04/04/2007  DATE OF DISCHARGE:  04/07/2007                               DISCHARGE SUMMARY   FINAL DIAGNOSIS:  1. Atrial fibrillation.      a.     Tikosyn initiated.      b.     Cardioversion done this admission with reversion to sinus       rhythm.  2. Coronary artery disease with previous anterior myocardial      infarction.  3. History of gout.  4. Hyperlipidemia under treatment.  5. Hypertension.  6. Obesity.  7. History of breast cancer.  8. Thrombocytopenia.      a.     Indeterminate assay for heparin-induced thrombosis       previously.   PROCEDURES:  Cardioversion and 2-D echocardiogram.   HISTORY:  This 77 year old male has a previous history of an anterior  infarction treated with thrombolytic therapy in 1985, with emergency  bypass grafting.  He has been on chronic Coumadin and he had a PTCA of  the right coronary artery in March 09, 1994.  His last Cardiolite  stress test was in November 2006.  He recently noted worsening fatigue  and was found to be in atrial fibrillation, and was placed on chronic  Coumadin.  He had been on Coumadin and it had been therapeutic for over  a month, but he was still in atrial fibrillation and complaining of  significant symptomatic fatigue, and was brought in to initiate  antiarrhythmic therapy and consider cardioversion.  Please see the  previously typed history and physical on the patient's chart for  remainder of the details.   HOSPITAL COURSE:  The patient was admitted mainly to start Tikosyn  therapy under monitoring.  On admission, his CBC showed a hemoglobin of  12, hematocrit 37.5, platelet count of 113,000, white count of 6500.  Chemistry panel shows a  sodium of 142, potassium 4.7, chloride 105, CO2  31, BUN 23, creatinine 1.46, glucose of 107.  PT/INR was 2.1.  Magnesium  level was normal.   EKG showed a previous anterior infarction.   Chest X-Ray showed cardiomegaly.  The patient was initiated and  underwent initiation with Tikosyn therapy.  He did have a first degree  AV block and had one intermittent right bundle branch block noted during  therapy.  After several doses of Tikosyn, he underwent cardioversion and  with 75 watts, resulting in reversion to sinus bradycardia.  His  atenolol dose was to be reduced because of first-degree AV block and  bradycardia   An echocardiogram showed a previous anterior infarction with an  estimated ejection fraction between 40 and 45%.  He has had no  significant clinical congestive heart failure.  He had a single INR of  1.9 and was given a single dose of Lovenox.  Following administration of  the  Lovenox dose, it was notified by the pharmacy that he had a  previously positive assay for heparin-induced thrombosis.  I consulted  the pharmacy since I was unaware of this and had no previous history of  this.  Platelets were monitored afterwards.  Follow-up platelet counts  were still low; the last platelet count was 93,000.  He received only 1  dose of Lovenox and none afterwards.   On review of the old records, he was found to have indeterminate assay  for heparin-induced thrombosis.  And, it was unclear at the time of  discharge whether he actually has this condition or not.  He was given  an education concerning Tikosyn and was instructed specifically to avoid  macrolide antibiotics, Cipro and related quinolones, and sulfa.  He is  discharged at this time in improved condition on potassium 20 mEq daily,  allopurinol 300 mg daily, Vytorin 10/40 mg daily, Hytrin 5 mg daily,  warfarin 7.5 mg alternating with 5 mg daily, aspirin 81 mg daily,  furosemide 40 mg daily, Fosamax 70 mg weekly.  In  addition, he is to  reduce his atenolol to 25 mg daily.  He was instructed to follow-up in  the office in 1 week.      Georga Hacking, M.D.  Electronically Signed     WST/MEDQ  D:  04/07/2007  T:  04/07/2007  Job:  [id number]   cc:   Hal T. Stoneking, M.D.

## 2010-08-07 NOTE — H&P (Signed)
NAME:  AVRUM, KIMBALL NO.:  [id number]   MEDICAL RECORD NO.:  [id number]          PATIENT TYPE:  INP   LOCATION:  NA                           FACILITY:  [facility]   PHYSICIAN:  Dyke Brackett, M.D.    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   CHIEF COMPLAINT:  Painful left hip.   HISTORY OF PRESENT ILLNESS:  This patient has had bilateral total knee  arthroplasties in the past and has done well in regards to that operative  intervention.  He, however, now has developed left groin and hip pain which  is worsening over the past several months.  His pain is now moderately  severe with intermittent stabbing and sharp pains.  Symptoms are worsening  now to the point where it is effecting his activities of daily living.  He  would like to consider total joint replacements at this time.   PAST MEDICAL HISTORY:  In general, his health is fair.   PAST HOSPITALIZATIONS:  1.  Myocardial infarction x2.  2.  Coronary artery bypass grafting, that of a triple bypass in June 1985.  3.  Radical mastectomy of the left breast in 1991.  4.  Angioplasty in 1995.  5.  Right total knee arthroplasty in January 2003.  6.  Left total knee arthroplasty in October 2003.   PAST SURGICAL HISTORY:  As above.   MEDICATIONS:  1.  Klor-Con 20 one daily.  2.  Terazosin 5 mg daily.  3.  Allopurinol 300 mg daily.  4.  Atenolol 50 mg h.s.  5.  Furosemide 40 mg daily.  6.  Colchicine 0.6 mg daily.  7.  Vytorin 10/40 mg daily.  8.  Baby aspirin daily.   ALLERGIES:  No known drug allergies.   REVIEW OF SYSTEMS:  A 14 point review of systems was unremarkable except for  a history of hypertension and coronary artery disease.  He has undergone  coronary artery bypass grafting of three vessels, as well as an  angioplasty.  He has had a radical mastectomy on the left for breast carcinoma.  He does  wear glasses also.   FAMILY HISTORY:  Significant for myocardial infarction in his father, and  cancer in his mother.   SOCIAL HISTORY:  A very pleasant 77 year old white male who is married and  is employed as an Aeronautical engineer.  He denies use of tobacco.  He  does drink six ounces a day.   PHYSICAL EXAMINATION:  GENERAL:  A 77 year old white male, well-developed,  well-nourished, alert, pleasant, and cooperative, in moderate distress  secondary to left hip pain.  VITAL SIGNS:  He is 5 feet 10 inches, weighs 200 pounds.  Temperature of  96.8, pulse of 60, respirations 16, blood pressure 118/76.  HEENT:  Head is normocephalic.  Eyes:  Pupils equal, round, reactive to  light and accommodation.  Extraocular movements intact.  Ears, nose, and  throat were benign.  NECK:  Supple, no thyromegaly.  CHEST:  Good expansion.  LUNGS:  Essentially clear to auscultation.  CARDIAC:  Regular rate and rhythm.  Normal S1 and S2, no discrete murmurs,  rubs, or gallops appreciated.  Pulses 2+ bilateral and symmetric.  No bruits  were noted.  ABDOMEN:  Obese, soft, nontender, no masses palpable, normal bowel sounds  present.  GENITOURINARY:  Not performed as not indicated for the procedure.  RECTAL:  Not performed as not indicated for the procedure.  NEUROLOGIC:  Oriented x3.  Cranial nerves II-XII grossly intact.  MUSCULOSKELETAL:  He has approximately 85 degrees of flexion of the left hip  with no real internal or external rotation.  Positive log roll.  Skin is  intact without cutaneous lesions.  Sensation is intact to light touch.  Good  strength of the lower extremity.  Right hip reveals around 45 degrees of  external rotation, 30 degrees of internal rotation with flexion to about 110  degrees.   LABORATORY DATA:  X-rays reveal advanced osteoarthritis of the left hip.   RECOMMENDATIONS:  At this  time, he is scheduled for surgical intervention.  The procedure was explained with risks and benefits, and he is  understanding.  He will proceed with surgery as scheduled.      Oris Drone Santiago Bumpers, P.A.-C.      Dyke Brackett, M.D.  Electronically Signed    BDP/MEDQ  D:  02/18/2005  T:  02/18/2005  Job:  [id number]

## 2010-08-07 NOTE — Discharge Summary (Signed)
[place]  Patient:    Martin [name], Martin [name] Visit Number: [id number] MRN: [id number]          Service Type: SUR Location: [id number] Attending Physician:  Teena Dunk Dictated by:   Jamelle Rushing, P.A. Admit Date:  03/29/2001 Discharge Date: 04/03/2001   CC:         Darden Palmer., M.D.                           Discharge Summary  ADMISSION DIAGNOSES: 1. Severe osteoarthritis of the right knee. 2. Hypertension. 3. Coronary artery disease with history of myocardial infarction and    three-vessel bypass and angioplasty. 4. Gout. 5. Hypercholesterolemia. 6. Benign prostatic hypertrophy.  DISCHARGE DIAGNOSES: 1. Right total knee arthroplasty. 2. Postoperative blood loss anemia. 3. Gout attack. 4. Coronary artery disease with history of myocardial infarction and coronary    artery bypass graft. 5. Hypercholesterolemia. 6. Benign prostatic hypertrophy.  HISTORY OF PRESENT ILLNESS:  The patient is a 77 year old white male with a history of 10-12 years right knee pain.  The pain has progressed with time and the amount of activity level.  The pain is described as a constant deep aching sensation with sharp shooting pains with awkward movement.  The pain is located in the knee with no radiation.  He does have increased discomfort with weightbearing activities.  He has no previous injuries.  He has had cortisone injections with some improvement.  He has no mechanical symptoms.  He denies any assistive device.  ALLERGIES:  No known drug allergies.  CURRENT MEDICATIONS: 1. Allopurinol 300 mg p.o. q.d. 2. Indomethacin 75 mg p.o. q.d. 3. Mevacor 20 mg p.o. q.d. 4. Atenolol 50 mg p.o. q.d. 5. Hytrin 5 mg p.o. q.d. 6. Triamterene 3.75/25 p.o. q.d.  SURGICAL PROCEDURE:  On March 29, 2001, the patient was taken to the OR by Dr. Frederico Hamman, assisted by Jamelle Rushing, P.A.-C.  Under general anesthesia, the patient  underwent a right total knee replacement with an LCS cemented rotating platform knee with a large femur and patella and a size #5 tibia.  The patient tolerated this procedure well.  One Hemovac was left in place.  There were no complications and the patient did receive a postoperative femoral nerve block.  The patient was transferred to the recovery room in good condition.  HOSPITAL COURSE:  On March 29, 2001, the patient was admitted to [facility] under the care of Dr. Frederico Hamman.  The patient was taken to the OR, where a right total knee arthroplasty was performed.  The patient tolerated this procedure well and was transferred to the recovery room in good condition.  The patient was placed on Coumadin for routine DVT prophylaxis and was managed by pharmacy protocol.  The patient then incurred a total of five-day postoperative course in which he did develop some postoperative blood loss anemia with his H&H dropping to 8.7 and 24.5.  Due to his comorbid medical issues, it was felt that he would benefit transfusion of 2 units of packed red blood cells.  This was performed and he tolerated this well without any further issues and his H&H improved to 10.2 and 28.1.  The patient did develop some gouty type symptoms in his left elbow and the patient had his allopurinol held and placed on colchicine with some improvement over the next two days.  The patient otherwise  had some slight increase in his temperature to 101.3 but otherwise no other problems. The patients vital signs remained stable.  His leg remained benign for any signs of infection.  His leg remained neuromotorvascularly intact.  He progressed well with physical therapy and his gouty attack resolved prior to being discharged to home on postoperative day #5.  LABORATORY DATA:  January 11 CBC:  WBC 10.6, hemoglobin 9.8, hematocrit 27.5, platelets 95 up from 78 the day previous.  PT was 19.9 with a 2.0 INR.  On January  12, routine chemistries of sodium 136, potassium 3.5, glucose 123 down from 158 two days previous, BUN 18, creatinine 1.7.  Routine urinalysis was normal.  MEDICATIONS ON DISCHARGE:  1. Colace 100 mg p.o. b.i.d.  2. Senokot 1 tablet p.o. b.i.d. a.c.  3. Laxative or enema of choice p.r.n.  4. Reglan 10 mg p.o. q.8h. p.r.n.  5. Tylenol 650 mg p.o. q.4h. p.r.n.  6. Robaxin 500 mg p.o. q.6h. p.r.n.  7. Restoril 30 mg p.o. q.h.s. p.r.n.  8. Atenolol 50 mg p.o. q.d.  9. Allopurinol was placed on hold. 10. Hytrin 5 mg p.o. q.h.s. 11. Triamterene/hydrochlorothiazide 1 tablet p.o. q.d. 12. Zocor 10 mg p.o. q.d. 13. Coumadin 5 mg p.o. q.d.  DISCHARGE INSTRUCTIONS:  1. Medications:  The patient is to continue all routine home medications     except for allopurinol.  2. OxyContin CR 10 mg 1 tablet twice a day.  3. Percocet 5 mg 1 or 2 tablets every 4-6 hours for breakthrough pain if     needed.  4. Colchicine 0.6 mg twice a day for three days, then restart allopurinol.  5. Coumadin 5 mg a day.  6. CPM 0-90 degrees for eight hours a day, increase by 5 degrees daily.  7. Activity:  As tolerated.  8. Diet:  No restrictions.  9. Wound care:  Keep wound clean and dry.  Call M.D. if any questions of     infection. 10. Followup:  Call ______ for a followup appointment with Dr. [name] in     one week.  CONDITION ON DISCHARGE:  The patients condition on discharge to home is listed as improved and good. Dictated by:   Jamelle Rushing, P.A. Attending Physician:  Teena Dunk DD:  05/04/01 TD:  05/04/01 Job: [id number]

## 2010-08-07 NOTE — H&P (Signed)
[place]  Patient:    Martin [name], Martin [name]. Visit Number: [id number] MRN: [id number]          Service Type: Attending:  Sharlot Gowda., M.D. Dictated by:   Jamelle Rushing, P.A. Adm. Date:  03/29/01                           History and Physical  DATE OF BIRTH:  05-23-1930  CHIEF COMPLAINT:  Right knee pain for the last 10-12 years.  HISTORY OF PRESENT ILLNESS:  The patient is a 77 year old white male with a history of 10-12 years of progressively worsening right knee pain.  The patient states he does have a constant pain all the time whether he is sitting, standing, or up walking around.  It worsens with any type of movement.  The patient does have pain at night, causing him to wake up.  He denies any mechanical symptoms.  The patient describes the pain as a constant aching sensation with sharp, shooting pains with awkward movements.  This does increase with weightbearing activities.  He does not have any radiation of the pain from the knee joint.  He denies any previous injury.  He has had improvement with cortisone injections, lasting two weeks.  The patient is currently not using an assistive device.  DRUG ALLERGIES:  No known drug allergies.  CURRENT MEDICATIONS: 1. Allopurinol 30 mg p.o. q.d. 2. Dexamethasone 75 mg p.o. q.d. 3. Mevacor 20 mg p.o. q.d. 4. Atenolol 50 mg p.o. q.d. 5. Hytrin 5 mg p.o. q.d. 6. Triamterene 37.5/25 mg p.o. q.d.  PAST MEDICAL HISTORY: 1. Hypertension, currently stable on above-mentioned medications. 2. Heart disease, MI in 1985, with three-vessel bypass.  Recurrent angina in    1996, with angioplasty.  No recurrent symptoms.  Last full workup with    stress test and angiogram one year ago. 3. Gout. 4. Hypercholesterolemia. 5. BPH. 6. The patient denies any history of diabetes, thyroid disease, hiatal hernia,    peptic ulcer or respiratory problems.  PAST SURGICAL HISTORY: 1.  Triple-vessel bypass in 1985. 2. Radical mastectomy in 1991. 3. Angioplasty in 1995.  The patient denies any complications with any of the above-mentioned surgical procedures.  SOCIAL HISTORY:  The patient is a 77 year old white male who denies any smoking history.  He does admit to four ounces of alcohol a day.  He is currently married.  He does have several grown children.  He lives in a three-story house, with all living facilities on one floor.  He is currently a retired Airline pilot.  FAMILY PHYSICIAN:  Dr. Donnie Aho.  FAMILY HISTORY:  Mother is deceased from unknown type of cancer.  Father is deceased from heart disease.  The patient has one brother with hypertension.  REVIEW OF SYSTEMS:  Positive for glasses at all times.  He does need a hearing aid in his left ear but does not use it due to no improvement.  Otherwise, review of systems negative for any other general, respiratory, sensory, GI, GU, cardiac, hematologic, musculoskeletal, neurologic, or mental status problems not previously mentioned above.  PHYSICAL EXAMINATION:  VITAL SIGNS:  Height 5 feet 10 inches tall, weight 220 pounds.  Pulse 60 and regular, respirations 12, temperature 98.2, blood pressure 154/88.  GENERAL:  Healthy-appearing, well-developed adult male.  Ambulates without any significant alteration in his gait.  He is able to get on and off the exam table without any difficulty  or any obvious distress.  HEENT:  Head normocephalic, atraumatic.  Nontender over maxillary or frontal sinuses.  Pupils are equal, round, reactive, and accommodative to light. Extraocular movements intact.  Sclerae nonicteric.  Conjunctivae is pink and moist.  External ears without deformities.  Canals patent.  TM on the left is completely sclerotic.  The TM on the ______ was sclerotic with a yellowish coloration.  Gross hearing was intact.  Nasal septum was midline.  Mucous membranes pink and moist.  No polyps noted.  Oral buccal  mucosa was pink and moist without any lesions.  Dentition was in good repair.  Uvula was midline and moved symmetrically with phonation.  The patient was able to swallow without any difficulty.  NECK:  Supple.  No palpable lymphadenopathy.  Thyroid gland was nontender. The patient had good range of motion of his cervical spine without any difficulty or tenderness.  CHEST:  Breath sounds clear and equal bilaterally.  No wheezes, rales, rhonchi, or rubs noted.  HEART:  Regular rate and rhythm.  S1, S2 were auscultated.  A 2/6 systolic harsh murmur was auscultated in the left precordium.  ABDOMEN:  Round, soft, nontender.  Bowel sounds normoactive throughout.  No hepatosplenomegaly.  CVA nontender to percussion.  EXTREMITIES:  Upper extremities symmetric in size and shape, and good range of motion of the elbows, shoulders, and wrists.  Motor strength 5/5 in all muscle groups tested.  Lower extremities:  Right and left hip had full extension, flexion up to 120 degrees with 10 degrees of internal and external rotation without any mechanical symptoms or discomfort.  Right knee had no sign of erythema or ecchymosis.  No palpable effusion.  Had significant amount of coarse crepitus under the patella with range of motion which was five degrees short of full extension and flexion back to about 80 degrees.  No valgus or varus laxity, and no anterior or posterior drawer.  He did have some medial joint line tenderness.  Left knee had significant amount of crepitus under the patella with range of motion which was 0-120 degrees.  No valgus or varus laxity.  No anterior or posterior drawer.  No mediolateral joint line tenderness.  No palpable effusion.  Bilateral calves were nontender. Bilateral ankles were symmetrical, with good dorsi- and plantar flexion.  NEUROLOGIC:  The patient was conscious, alert, and appropriate.  Held an easy conversation with the examiner.  Cranial nerves II-XII were  grossly intact. Deep tendon reflexes of the upper and lower extremities were symmetrical right  to left.  The patient was grossly intact to light touch sensation.  Peripheral vasculature:  Carotid pulses were 2+, no bruits.  Radial pulses 2+, femoral pulses 1+, dorsalis pedis pulses 1+.  The patient had no lower extremity edema.  He had no lower extremity venous stasis changes or pigmentation changes.  BREASTS, RECTAL, GENITOURINARY:  Deferred at this time.  IMPRESSION: 1. Severe osteoarthritis right knee. 2. Hypertension. 3. Coronary artery disease with a history of myocardial infarction in 1985,    with three-vessel bypass, angioplasty in 1995. 4. Gout. 5. Hypercholesterolemia. 6. Benign prostatic hypertrophy.  PLAN:  The patient will be add to [facility] under the care of Dr. Frederico Hamman on March 29, 2001.  The patient has donated two units of autologous blood for this surgical procedure.  The patient will undergo all routine laboratories and tests prior to having a right total knee arthroplasty. Dictated by:   Jamelle Rushing, P.A. Attending:  Marcie Mowers,  Montez Hageman., M.D. DD:  03/23/01 TD:  03/23/01 Job: [id number]

## 2010-08-07 NOTE — Discharge Summary (Signed)
NAME:  Martin [name], Martin [name]                          ACCOUNT NO.:  [id number]   MEDICAL RECORD NO.:  [id number]                   PATIENT TYPE:  INP   LOCATION:  5033                                 FACILITY:  [facility]   PHYSICIAN:  Dyke Brackett, M.D.                 DATE OF BIRTH:  [birth year]   DATE OF ADMISSION:  01/17/2002  DATE OF DISCHARGE:  01/21/2002                                 DISCHARGE SUMMARY   ADMISSION DIAGNOSES:  1. Severe osteoarthritis left knee.  2. Hypertension.  3. History of myocardial infarction with three vessel bypass.  4. Gout.  5. Hypercholesterolemia.  6. Benign prostatic hypertrophy.   DISCHARGE DIAGNOSES:  1. Left total knee arthroplasty.  2. Postoperative blood loss anemia.  3. Hypertension.  4. History of myocardial infarction with three vessel bypass.  5. History of gout.  6. Hypercholesterolemia.  7. Benign prostatic hypertrophy.   HISTORY OF PRESENT ILLNESS:  The patient is a 77 year old white male with a  history of right total knee arthroplasty in January of 2003.  The patient  has had good results with his right knee but since having had that done, he  has been having progressively worsening left knee discomfort.  At times the  patient has significant discomfort, a deep aching sensation within the knee  with any type of ambulating and heavy loads going up and down stairs or  uneven surfaces.  The patient describes it as a deep severe aching sensation  within the knee with no radiation.  He denies any mechanical symptoms.   X-ray revealed virtual bone on bone left medial compartment with decreased  lateral compartment space, significant osteophytes along the medial and  lateral joint spaces and along the superior and inferior poles of the  patella.  The patient has a large calcific mass within the quadriceps  tendon.   ALLERGIES:  No known drug allergies.   CURRENT MEDICATIONS:  1. Potassium chloride 20 mEq p.o. q.d.  2. Hydrin 5 mg p.o.  q.d.  3. Allopurinol 300 mg p.o. q.d.  4. Atenolol 50 mg p.o. q.d.  5. Lovastatin 20 mg p.o. q.d.  6. Lasix 40 mg p.o. q.d.  7. Colchicine 0.6 mg p.o. q.d.   PROCEDURE:  On January 17, 2002, the patient was taken to the OR by Dr.  Frederico Hamman assisted by Jamelle Rushing, P.A.  Under general anesthesia,  the patient underwent a left total knee arthroplasty.  The patient tolerated  the procedure well.  There were no complications and he was transferred to  the recovery room in good condition after receiving a postoperative femoral  nerve block.   CONSULTATIONS:  The following routine consults were requested:  Physical  therapy, occupational therapy, rehab.   HOSPITAL COURSE:  On January 17, 2002, the patient was taken to the OR by  Dr. Frederico Hamman, assisted by Jamelle Rushing, P.A.  The patient tolerated  the left total knee arthroplasty without any complications.  He was  transferred to the recovery room and then to the orthopedic floor in good  condition.  The patient was placed on Lovenox for routine DVT prophylaxis.   The patient then occurred a total of four days' postoperative care on the  orthopedic floor in which the patient did develop some postoperative blood  loss anemia with his hemoglobin dropping to 8.4/23.8.  The patient was  transfused two units of autologous blood with no improvement of his CBC on  the following day so he was retyped and crossed and transfused two more  units of packed red blood cells.  The patient tolerated these transfusions  well without any problems and he ultimately did have some good improvement  of his hemoglobin.  Otherwise the patient's vital signs remained stable.  He  remained with only a low grade temperature throughout.  His wound remained  benign for any signs of infection and his leg remained neuromotor vascularly  intact.  The patient's pain was well controlled with pain medications after  having his PCA discontinued on  postoperative day #2.   The patient worked well with physical therapy without any complications and  it was felt on postoperative day #4 that he progressed well enough and was  safe to be discharged home with arrangements for home health physical  therapy follow-up.  The patient was discharged to home in good condition.   LABORATORY DATA:  The CBC on January 21, 2002, showed wbc 6.5, hemoglobin  9.8, hematocrit 28.3, platelets 112.  Routine chemistries on January 20, 2002, showed sodium 135, potassium 3.4, glucose 129 down from a high of 157,  BUN 11, creatinine 1.4.  Routine urinalysis on admission was negative.  The  patient received a total of two units of autologous blood and two units of  packed red blood cells during this hospitalization.   DISCHARGE MEDICATIONS:  Medications upon discharge from the orthopedic floor  were:  1. Colace 100 mg p.o. b.i.d.  2. Lovenox 30 mg subcu q.12h.  3. Potassium chloride 20 mEq p.o. q.d.  4. Hytrin 5 mg p.o. q.h.s.  5. Allopurinol 300 mg p.o. q.d.  6. Atenolol 50 mg p.o. q.d.  7. Zocor 10 mg p.o. q.d.  8. Lasix 40 mg p.o. q.d.  9. Colchicine 0.6 mg p.o. q.d.  10.      Trinsicon one tablet p.o. t.i.d.  11.      Phenergan 25 mg p.o. q.6h. p.r.n.  12.      Tylenol 650 mg p.o. q.4h. p.r.n.  13.      Robaxin 500 mg p.o. q.6h. p.r.n.  14.      Restoril 30 mg p.o. q.h.s. p.r.n.   DISCHARGE INSTRUCTIONS:  1. Medications:     a. Lovenox 40 mg injection once a day for a total of 10 days.     b. Percocet one or two tablets every six hours for pain as needed.  2. Activity:  No restrictions.  3. Diet:  No restrictions.  4. Wound care:  May shower after drainage from wound has stopped for two     days and call for any temperature greater than 101.5, chills, increased     pain not relieved by pain medications, or foul smelling discharge from     wound.  5. Follow-up:  Call for follow-up appointment with Dr. [name] on February 01, 2002.    CONDITION ON  DISCHARGE:  The patient's condition upon discharge to home is  listed as good.     Jamelle Rushing, Arnetha Courser, M.D.    RWK/MEDQ  D:  02/27/2002  T:  02/27/2002  Job:  [id number]

## 2010-08-07 NOTE — Op Note (Signed)
   NAME:  BODEN, STUCKY NO.:  [id number]   MEDICAL RECORD NO.:  [id number]                   PATIENT TYPE:  INP   LOCATION:  2550                                 FACILITY:  [facility]   PHYSICIAN:  Thera Flake., MD               DATE OF BIRTH:  [birth year]   DATE OF PROCEDURE:  01/17/2002  DATE OF DISCHARGE:                                 OPERATIVE REPORT   ADDENDUM:  Once the final components were inserted and the cement had hardened.  The  tibial bearing was removed.  A small amount of cement was removed from the  posterior run-in.  Tourniquet was released with the bearing out.  There was  no excessive bleeding.  Small bleeders were coagulated.  The bearing was  replaced.  Closure was effected with #1 Ethibond, 2-0 Vicryl and skin clips.  A Hemovac drain was placed and Dr. Katrinka Blazing was then to return for  postoperative femoral nerve block.                                               Thera Flake., MD    WDC/MEDQ  D:  01/17/2002  T:  01/17/2002  Job:  [id number]

## 2010-08-07 NOTE — Discharge Summary (Signed)
NAME:  Martin [name], WRIGHTSMAN NO.:  [id number]   MEDICAL RECORD NO.:  [id number]          PATIENT TYPE:  INP   LOCATION:  5001                         FACILITY:  [facility]   PHYSICIAN:  Dyke Brackett, M.D.    DATE OF BIRTH:  [birth year]   DATE OF ADMISSION:  02/26/2005  DATE OF DISCHARGE:  03/06/2005                                 DISCHARGE SUMMARY   ADMISSION DIAGNOSES:  1.  Pain and osteoarthritis left hip.  2.  History of myocardial infarction X2.  3.  Coronary artery bypass grafting in June, 1985.  4.  Radical mastectomy left breast in 1991.  5.  Angioplasty in 1995.  6.  Right total knee arthroplasty January, 2003.  7.  Left total knee arthroplasty October, 2003.   DISCHARGE DIAGNOSES:  1.  Status post left total hip arthroplasty.  2.  Thrombocytopenia exacerbated by heparin.  3.  Indeterminate Hep screen.  4.  Acute blood loss anemia secondary to surgery replaced with 4 units of      packed red blood cells.  5.  Hyperkalemia treated.  6.  History of myocardial infarction X2.  7.  Coronary artery bypass grafting in June, 1985.  8.  Radical mastectomy left breast 1991.  9.  Angioplasty in 1995.  10. Right total knee arthroplasty January, 2003.  11. Left total knee arthroplasty October, 2003.   ALLERGIES:  No known drug allergies.   MEDICATIONS:  1.  Klor-Con 20 mg daily.  2.  Terazosin 5 mg daily.  3.  Allopurinol 300 mg daily.  4.  Atenolol 50 mg at bedtime.  5.  Furosemide 40 mg daily.  6.  Colchicine 0.6 mg daily.  7.  Vytorin 10/40 mg daily.  8.  81 mg aspirin daily.   HISTORY OF PRESENT ILLNESS:  Martin [name] is a 77 year old white male who has  undergone bilateral total knee arthroplasties in the past who presents with  painful left hip. The patient's left hip pain is described as groin pain  which has become worse over the past several months. The pain is moderate to  severe intermittent stabbing pain. Symptoms worse with ADL's and ambulation.  X-rays of the left hip showed advanced osteoarthritis of the left hip.   SURGICAL PROCEDURE:  The patient was taken to the operating room on February 26, 2005 by Dr. [name], assisted by Oris Drone. Petrarca, P.A. The patient was  placed under general anesthesia and underwent a left total hip arthroplasty.  The following components were used: A P porous coated large stature AML hip  size 18, with a 36 mm head, negative 2, and an acetabulum cup size 54 with  apex hole eliminator. The patient tolerated the procedure well and returned  to recovery in good stable condition.   CONSULTS:  The following consults were obtained while the patient was  hospitalized PTIT case management and heme/oncology.   HOSPITAL COURSE:  On postoperative day 1, the patient was afebrile. H and H  was 7.9 and 22.1. The patient was transfused with 2 units of packed red  blood cells. On  postoperative day 2, T max 101.0. The patient otherwise  without complaint. H and H at that time was 8.9 and 25.9. The patient with  thrombocytopenia with platelets of 75,000. Lovenox was held. On  postoperative day 3, the patient's pain under adequate control, no shortness  of breath, no chest pain, no calf pain. T max 101.2. Otherwise vital signs  stable. H and H was 8.8 and 25.2, platelets were 73,000. Hep panel was  ordered. Lovenox held. On postop day 4, the patient had adequate pain  control. No chest pain, no shortness of breath. T max 100.1. Vital signs  stable. H and H stable at 8.5 and 25.4. Platelets were 89,000. Hep screen  was positive for heparin antibody. Heme/oncology consults ordered. Dopplers  of the lower extremities also obtained. It was felt by heme/oncology that  the Hep screen was indeterminate. The patient was placed on Refludan and  Coumadin therapy was initiated. All heparin products held. On postoperative  day 5 the patient had no complaints. T max was 100.7. Vital signs all  stable. Minimal serous drainage  from the left hip wound. H and H was 8.4 and  23.6. The patient was transfused with an additional 2 units of packed red  blood cells. White count was 5,600. The patient's T max was 100.7 and the  patient's lungs were clear on the examination. Chest x-ray and UA were  ordered. The patient's PTT was 81, INR was __________ . Rehab consult was  ordered as the patient was requiring close supervision with ambulation.  Otherwise the patient was able to walk 60 paces with a rolling walker. On  postoperative day 6, the patient with no major complaints, T max 99.4. Vital  signs all stable. H and H was 9.1 and 26.0, platelets 114. PT is 17.6, INR  is 1.4, PTT was 87. On postop day 7, the patient afebrile, vital signs  stable. H and H is 9.9 and 27.7, platelets 127, INR was 1.6. The patient  held Coumadin until INR was 1.8. On postoperative day 8 the patient with no  complaints requesting to go home, vital signs all stable. INR was 1.8. H and  H was 10.0 and 28.2, platelets were 150,000. At that time the patient was  moderately independent at 120 paces with a rolling walker, moderately  independent with transfers. At this point it was felt that the patient could  be discharged home in stable condition.   LABORATORY DATA:  Routine labs on admission dated February 22, 2005 showed  CBC with white count 7,500, hemoglobin 12.9, hematocrit 35.9, platelets  140,000. Coags on admission February 22, 2005 PT was 14.0, INR of 1.1, PTT  was 27. Routine chemistries on admission dated February 22, 2005 showed  sodium 141, potassium 3.4 low, chloride was 104, bicarb 29, glucose 106  high. BUN 16, creatinine high at 1.6. Special hematology labs dated March 02, 2005 showed retic percent 2.8, RBC's 2.80 low, retic ABS 78.4, immature  retic fraction percentage was 23.8 high. Other special hematology studies  dated March 01, 2005, heparin 0.1 U's per ml abnormal, heparin 1.0 U's per ml abnormal, heparin 10 U's per ml  abnormal, heparin 100 U's per ml  absent. Next rapid screen heparin antibody screen positive sent for  confirmation. That was on March 01, 2005 at 09:05 hours. Hep associated  AB protection negative dated March 01, 2005 at 09:06 hours. Hepatic  enzymes on admission AST was elevated at 40, ALT 33, ALP 67,  total bilirubin  0.7. Urinalysis on admission was negative. Left hip 2 views on February 26, 2005 showed left total hip arthroplasty recently performed. Hardware  components in anatomic alignment, no complications identified, moderate  degenerative joint disease right hip noted. Tibia and left femur dated  March 01, 2005 showed left total hip arthroplasty components in  satisfactory position and alignment. Left total knee was also noted. No  acute bony abnormalities. Two views of the left hip dated March 01, 2005  showed status post left total hip replacement without complicating features.  Chest x-ray dated March 03, 2005 compared to February 22, 2005 film showed  no acute disease. EKG on February 26, 2005 showed sinus bradycardia with  first degree AV block. Heart rate was 59 beats per minute. PR interval 246  milliseconds. PRT axis 30, __________ . Doppler of the lower extremities  dated March 02, 2005 showed no evidence of DVT, SVT, Baker cyst, no  plating into the soft tissues noted either.   DISCHARGE INSTRUCTIONS:  1.  Diet no restrictions.  2.  Activities, the patient to have 50% weight-bearing on the left leg with      walker.  3.  Wound care, the patient is to keep the wound clean and dry, change the      dressing daily. Call office if any signs of infection or if pain is not      adequately controlled.   MEDICATIONS:  The patient is to resume all home medications except for  aspirin, no aspirin while on Coumadin. The following medications were added:  1.  Percocet 5/325 one to two every 4 to 6 hours as needed for pain.  2.  [facility]   FOLLOW UP:  1.  Home health PT per [facility].  2.  Follow-up. The patient needs follow-up with Dr. [name] following      Tuesday from discharge. The patient is to call office at [phone].  3.  The patient also to follow-up with Dr. Ladona Horns. Neijstrom for baseline      thrombocytopenia. The patient is to call Dr. Thornton Papas office for      follow-up appointment.      Richardean Canal, Arnetha Courser, M.D.  Electronically Signed    GC/MEDQ  D:  05/12/2005  T:  05/13/2005  Job:  [id number]   cc:   Ladona Horns. Mariel Sleet, MD  Fax: [phone]

## 2010-08-07 NOTE — Op Note (Signed)
NAME:  Martin [name], Martin [name] NO.:  [id number]   MEDICAL RECORD NO.:  [id number]                   PATIENT TYPE:  AMB   LOCATION:  ENDO                                 FACILITY:  [facility]   PHYSICIAN:  Danise Edge, M.D.                DATE OF BIRTH:  [birth year]   DATE OF PROCEDURE:  12/31/2002  DATE OF DISCHARGE:                                 OPERATIVE REPORT   PROCEDURE:  Screening colonoscopy.   REFERRING PHYSICIANS:  Georga Hacking, M.D.  Ladona Horns. Mariel Sleet, MD   INDICATIONS:  [name] is a  77 year old male born November 22, 1930.  On December 09, 1997, [name] underwent his first screening  colonoscopy.  From the distal sigmoid colon, a 2 mm hyperplastic  (nonneoplastic) polyp was removed from the colon.  [name] is scheduled to  undergo a screening colonoscopy with polypectomy to prevent colon cancer.   ALLERGIES:  None.   PAST MEDICAL HISTORY:  1. Myocardial infarction, May 1985.  2. Triple vessel coronary artery bypass grafting, June 1985.  3. Breast cancer surgery, June 1991.   ENDOSCOPIST:  Danise Edge, M.D.   PREMEDICATION:  Versed 7.5 mg, Demerol 60 mg.   DESCRIPTION OF PROCEDURE:  After obtaining informed consent, [name] was  placed in the left lateral decubitus position.  I administered intravenous  Demerol and intravenous Versed to achieve conscious sedation for the  procedure.  The patient's blood pressure, oxygen saturation and cardiac  rhythm were monitored throughout the procedure and documented in the medical  record.   Anal inspection was normal.  Digital rectal exam revealed a nonnodular  prostate.  The Olympus adjustable pediatric colonoscope was introduced into  the rectum and easily advanced to the cecum.  Colonic preparation for the  exam today was excellent.   Rectum:  Normal.  sigmoid colon and descending colon:  Left colonic diverticulosis.  Splenic flexure:  Normal.  Transverse colon:   Normal.  Hepatic flexure:  Normal.  Ascending colon:  Normal.  Cecum and ileocecal valve:  Normal.    ASSESSMENT:  Normal screening proctocolonoscopy to the cecum.  No endoscopic  evidence for the presence of colorectal neoplasia.   RECOMMENDATIONS:  Repeat optical colonoscopy in 10 years.  Consider virtual  colonoscopy in five years.                                               Danise Edge, M.D.    MJ/MEDQ  D:  12/31/2002  T:  12/31/2002  Job:  [id number]   cc:   W. Ashley Royalty., M.D.  [address], [address]  [place]  [place] [address]  Fax: [phone]   Ladona Horns. Neijstrom, MD  [address]  [place]  [place] [address]  Fax: [phone]

## 2010-08-07 NOTE — Op Note (Signed)
[facility]  Patient:    ELIJAN, GOOGE Visit Number: [id number] MRN: [id number]          Service Type: SUR Location: RCRM 2550 13 Attending Physician:  Teena Dunk Dictated by:   Sharlot Gowda., M.D. Admit Date:  03/29/2001                             Operative Report  PREOPERATIVE AND POSTOPERATIVE DIAGNOSIS:  Osteoarthritis, right knee, with varus deformity.  OPERATION:  Right total knee replacement (LCS cemented rotating platform knee with large femur, patella, and size 5 tibia.  SURGEON:  Sharlot Gowda., M.D.  ASSISTANT:  Jamelle Rushing, P.A.  TOURNIQUET TIME:  Approximately 30 minutes.  DESCRIPTION OF PROCEDURE:  The patient was thoroughly prepped and draped.  The tourniquet was inflated and was used about the midportion of the case.  The tourniquet could have been disrupted from the tourniquet itself; however, there was no excessive bleeding.  It was elected to leave the tourniquet deflated after that rather than risk contaminating the field by a replacement of the tourniquet.  A midline incision, a medial parapatellar approach to the knee, was made.  There was moderate varus deformity and a mild flexion contracture.  The tibia was cut about 2 mm below the most diseased medial compartment with a degree posterior slope, followed by sizing the large femur.  Anterior and posterior femoral cuts made to balance the knee.  It was required to do a moderate amount of release from the medial side of the knee due to the varus deformity with stripping, which balanced the medial and lateral side nicely with the flexion gap, followed by cutting the distal femur with a 4-degree valgus revision cut made to obtain slight hyperextension of the knee, followed by anterior and posterior chamfer cuts and the peg holes cut for the femoral prosthesis.  The patella was cut, allowing for 12 mm of bone on the prosthetic  replacement.  The tibia was cut with a keyhole type design.  Trial reduction carried out with a 10 mm barring.  Excellent stability was noted.  There was still very slight tendency of this to obtain neutral.  For this reason, a moderate amount of stripping and complete release of the PCL was carried out with care being made to protect the neurovascular strictures.  The excess menisci remnants were removed.  The bony surfaces were copiously irrigated.  The final prosthesis was inserted with 750 mg of ______ to a bag of cement, tibia followed by femur and patella.  Cement was allowed to harden.  Excess cement was removed.  Range of motion was again checked at 0-110 degrees motion with excellent stability and no tendency for barring spin-out.  Good medial and lateral stability and balancing of the ligaments was obtained.  A Hemovac was placed deeply.  Closure was effected with #1 Ethibond on the capsule and 2-0 Vicryl on the subcutaneous tissue.  The skin was closed using a stapling device, and I believe anesthesia at that point was probably going to come in to perform a postop femoral nerve block. Dictated by:   Sharlot Gowda., M.D. Attending Physician:  Teena Dunk DD:  03/29/01 TD:  03/29/01 Job: [id number]

## 2010-08-07 NOTE — Op Note (Signed)
NAME:  Martin [name], Martin [name] NO.:  [id number]   MEDICAL RECORD NO.:  [id number]          PATIENT TYPE:  INP   LOCATION:  2899                         FACILITY:  [facility]   PHYSICIAN:  Dyke Brackett, M.D.    DATE OF BIRTH:  [birth year]   DATE OF PROCEDURE:  02/26/2005  DATE OF DISCHARGE:                                 OPERATIVE REPORT   PREOPERATIVE DIAGNOSIS:  Severe osteoarthritis, left hip.   POSTOPERATIVE DIAGNOSIS:  Severe osteoarthritis, left hip.   OPERATION:  Left total hip replacement (DePuy porous-coated, large stature  AML hip with size #18, with 36 mm head -2, an acetabulum that has a Pinnacle  cup size #54 with apex hole eliminator).   SURGEON:  Dyke Brackett, M.D.   ASSISTANT:  Arlys John D. Petrarca, P.A.-C.   ESTIMATED BLOOD LOSS:  Probably 600 mL.   DESCRIPTION OF PROCEDURE:  A sterile prep and drape in the lateral position  with a posterior placement of the hip made.  Careful protection of the  sciatic nerve.  A  capsulotomy made in the hip with excision of osteophytes,  particularly on the anterior aspect of the acetabulum.  A neck cut was made  one finger breadth above the lesser trochanter.  Severe wear was noted at  both sides of the joint.  The femur progressively sized up to accept a size  #18 stem with a broach.  A calcar reamer was used as well.  Excellent fit  was obtained with the broach.   Attention was next directed to the acetabulum.  The acetabulum progressively  reamed up for a 53, to accept a 54 cup down to the level of the fovea with  good bleeding bone.  Small bleeders were coagulated.  The position of the  cut was placed about 45 degrees of abduction and then 15 degrees of  anteversion, in basically anatomic alignment to its native acetabulum.  A  trial liner was placed.  Eventually after sizing different combinations  including a Prodigy, in addition to the AML, it was elected to go to a 36  head, which brought in a little more  stability.  Leg lengths deemed to be  approximately equal, and a neutral liner was used due to the excellent  stability.  The final components were next inserted.  The acetabular liner,  followed by the femur.  An apex screw was not used.  The trial reduction was  carried out.  Excellent stability was noted.  The closure was effected with  #1 Ethibond on the capsule and fascia lata, with #2-0 Vicryl, skin clips.  Marcaine with epinephrine placed in the skin.  A light compressive sterile  dressing was applied.      Dyke Brackett, M.D.  Electronically Signed     WDC/MEDQ  D:  02/26/2005  T:  02/26/2005  Job:  [id number]

## 2010-08-07 NOTE — Consult Note (Signed)
NAME:  Martin [name], Martin [name] NO.:  [id number]   MEDICAL RECORD NO.:  [id number]          PATIENT TYPE:  INP   LOCATION:  5001                         FACILITY:  [facility]   PHYSICIAN:  Lauretta I. Odogwu, M.D.DATE OF BIRTH:  1930-05-07   DATE OF CONSULTATION:  03/02/2005  DATE OF DISCHARGE:                                   CONSULTATION   REASON FOR CONSULTATION:  Thrombocytopenia.   Martin [name] is a 77 year old gentleman who is admitted for left total hip  replacement for severe osteoarthritis on February 26, 2005.  The patient has  undergone previous bilateral knee arthroplasties.  He last received  anticoagulation 3 years ago following knee surgery.  He had thrombocytopenia  and was evaluated by Dr. Hilbert Bible with no specific findings.   Laboratories on admission noted the following:   February 22, 2005 platlets were 140,000; February 26, 2005 87,000.  Following surgery on February 27, 2005, the patient was placed on Lovenox  and platelets on March 01, 2005 were 73,000;and on March 02, 2005  89,000.  Lovenox was discontinued on March 01, 2005.  Fibrinogen and D-  dimer were 790 and 3.54, respectively.  Initial heparin antibody screen was  positive and confirmatory HIT test noted indeterminate platlet aggregation.  Lower bilateral extremity Doppler's performed March 02, 2005 noted no  evidence of deep venous thrombosis.  The patient himself denies cough,  hemoptysis, pleuritic chest pain, skin nodules or rashes.  Patient denies a  personal history for deep venous thrombosis and family history of clotting  disorders.   PAST MEDICAL HISTORY:   FAMILY HISTORY:   PAST MEDICAL HISTORY:  1.  Status post bilateral knee arthroplasties.  2.  Myocardial infarction x2.  3.  Coronary artery bypass graft triple bypass in June of 2006.  4.  Angioplasty in 1999.  5.  The patient has a history of left breast cancer, S/P mastectomy and is      followed up by Dr.  Glenford Peers.  6.  History of hypercholesterolemia.  7.  Gout.  8.  Hypertension.  9.  Benign prostatic hypertrophy.   IN HOUSE MEDICATIONS:  1.  Terazosin.  2.  K-Lor.  3.  Allopurinol.  4.  Atenolol.  5.  Colchicine.  6.  Zetia.  7.  Lasix.  8.  Zocor.  9.  Hytrin.  10. Zofran.  11. Tylenol.  12. Phenergan.  13. Senokot.   ALLERGIES:  NO KNOWN DRUG ALLERGIES.   SOCIAL HISTORY:  The patient is married, an Airline pilot.  He denies a history  of tobacco use but drinks alcohol socially.   FAMILY HISTORY:  Pertinent in that his father had myocardial infarction and  mother had cancer of unknown type.   REVIEW OF SYSTEMS:  CONSTITUTIONAL:  Denies fever, chills, night sweats,  weight loss, anorexia.  RESPIRATIONS:  Denies cough, hemoptysis, wheeze,  shortness of breath.  CARDIOVASCULAR:  Denies chest pain, paroxysmal  nocturnal dyspnea, orthopnea, ankle swelling.  GASTROINTESTINAL:  Denies  nausea and vomiting, abdominal pain, diarrhea, constipation, melena.  GENITOURINARY:  Denies dysuria, hematuria, frequency.  SKIN:  Denies  bruising, petechia.   PHYSICAL EXAMINATION:  GENERAL:  The patient is alert and oriented x3.  VITAL SIGNS:  Pulse 71, blood pressure 98/55, temperature 97.7, respirations  18, saturations 97% on room air.  HEENT:  Head is atraumatic and normocephalic.  Sclerae is anicteric.  Mouth  moist without ulcerations or thrush, lesions.  NECK:  Supple without adenopathy and trachea midline.  CHEST:  Reveals good entry bilaterally and clear to both percussion and  auscultation.  CARDIOVASCULAR:  Exam revealed a grade 2/6 systolic murmur in the left  sternal border.  Heart rate was regular.  ABDOMEN:  Soft and nontender with no hepatosplenomegaly and bowel sounds  present.  EXTREMITIES:  Revealed trace edema with no clubbing.  SKIN: Reveals no bruising, lesions or petechia.  NEUROLOGICAL:  Exam was nonfocal.   LABORATORY DATA:  White cell count 6.3,  hemoglobin 8.5, hematocrit 24.4,  platelets 89,000.  Sodium 140, potassium 2.8, chloride 105, CO2 29, BUN 12,  creatinine 1.4, glucose 121.  Liver function tests normal.  PT 14.8, INR  1.1, PTT 31.  For the rest of the laboratory data, please see above.  Results of Doppler negative for deep venous thrombosis.   IMPRESSION:  1.  Indeterminate heparin-induced thrombocytopenia without evidence of      thrombosis.  2.  Status post left total hip replacement postoperative day #5.  The      patient placed on Lovenox prophylactically following surgery which has      now been discontinued.   PLAN:  Post-operative hip surgical patients are usually at high risk for deep  venous thrombosis and Martin [name] will require some form of anticoagulation but  preferably not heparin products.  Would suggest Lepirudin, (pharmacy to  dose)  and initiate Coumadin to eventually obtain an INR of 2-3.  Once therapeutic  INR has been achieved Lepirudin can be discontinued.  If any further  questions or concerns should arise please do not hesitant to contact me.  Thank you for this referral.      Lauretta I. Odogwu, M.D.  Electronically Signed     LIO/MEDQ  D:  03/02/2005  T:  03/02/2005  Job:  [id number]

## 2010-08-07 NOTE — Op Note (Signed)
   NAME:  KALVYN, DESA NO.:  [id number]   MEDICAL RECORD NO.:  [id number]                   PATIENT TYPE:  INP   LOCATION:  2550                                 FACILITY:  [facility]   PHYSICIAN:  Thera Flake., MD               DATE OF BIRTH:  09/25/1930   DATE OF PROCEDURE:  01/17/2002  DATE OF DISCHARGE:                                 OPERATIVE REPORT   INCOMPLETE   PREOPERATIVE DIAGNOSES:  Severe osteoarthritis of the left knee.   POSTOPERATIVE DIAGNOSES:  Severe osteoarthritis of the left knee.   OPERATION PERFORMED:  Left total knee replacement (LCS cemented rotating  platforms, size 5 tibia, 12.5 mm bearing, large femur, large 3-peg patella).   SURGEON:  Dyke Brackett, MD   ASSISTANT:  Jamelle Rushing, P.A.   TOURNIQUET TIME:  One hour.   ANESTHESIA:   DESCRIPTION OF PROCEDURE:  Sterile prep and drape.  Exsanguination of leg,  inflation of tourniquet to 375 mmHg.  Straight skin incision, medial  parapatellar approach to the knee was made.  The tibia was cut with a tibial  cutting jig about 2 mm below the most diseased medial compartment.  Distal  femoral cut with anterior posterior femoral cut with flexion extension gap  which matched at 12.5 mm with good balance in the ligaments with moderate  stripping of the medial side due to varus deformity.  A 4 degree distal cut  was made followed by the chamfer cuts and the key holes for the prosthesis.  Excess bone was removed from the posterior aspect of the knee.  Excess  menisci  remnants were removed as well as release and resection of the PCL.  Femoral trial showed basically an anatomic fit.  Tibia sized at a #5 and a  keel control cut for the tibia followed by tibial trialing with a 12.5 mm  bearing, full extension, good stability and full range of motion on the  table was obtained.  The patella was cut leaving about 14 mm of native  patella with a 3-peg patella.  Tracking of  patellofemoral joint was normal  with a trial.  Trial components were removed.  Cement was mixed and inserted  with 750 mg per batch of cement.  The final components were inserted.  Tibia  followed by femur and patella.                                                 Thera Flake., MD    WDC/MEDQ  D:  01/17/2002  T:  01/17/2002  Job:  [id number]

## 2010-08-07 NOTE — H&P (Signed)
NAME:  Martin [name], Martin [name] NO.:  [id number]   MEDICAL RECORD NO.:  [id number]                   PATIENT TYPE:  INP   LOCATION:  NA                                   FACILITY:  [facility]   PHYSICIAN:  Dyke Brackett, MD                   DATE OF BIRTH:  March 31, 1930   DATE OF ADMISSION:  01/17/2002  DATE OF DISCHARGE:                                HISTORY & PHYSICAL   CHIEF COMPLAINT:  Occasional severe pain in the left knee.   HISTORY OF PRESENT ILLNESS:  The patient is a 77 year old white male with a  history of right total knee arthroplasty in January of 2003.  The patient  has had good results with the right knee, but since having the right knee  replaced, the left knee has been giving him progressively worsening  discomfort.  At times, the patient has significant discomfort, deep aching  within the knee with any heavy loads, ambulating long periods of time, going  up and down uneven surfaces, and up and down stairs.  The patient describes  it as a deep severe aching sensation within the knee.  No radiation.  He  denies any mechanical symptoms.   X-rays reveal virtual bone on bone, left medial compartment with decreased  lateral compartment and significant osteophytes along the medial and lateral  joint spaces and along the superior and inferior poles of the patella.  He  has a large calcific mass just superior within the quadriceps tendon region.   ALLERGIES:  No known drug allergies.   CURRENT MEDICATIONS:  1. Potassium chloride XR 20 mEq p.o. q.d.  2. Hydrin 5 mg p.o. q.d.  3. Allopurinol 300 mg p.o. q.d.  4. Atenolol 50 mg p.o. q.d.  5. Lovastatin 20 mg p.o. q.d.  6. Lasix 40 mg p.o. q.d.  7. Colchicine 0.6 mg p.o. q.d.   PAST MEDICAL HISTORY:  1. Hypertension.  2. MI with three-vessel bypass in 1995 with subsequent reangioplasty in 1996     with no cardiac symptoms since.  3. Gout.  4. Hypercholesterolemia.  5. BPH.   PAST SURGICAL  HISTORY:  1. Triple vessel bypass in 1985.  2. Radical mastectomy in 1991.  3. Angioplasty in 1995.  4. Right knee arthroplasty in 2003.   The patient denies any complications with the above-mentioned surgical  procedures.   SOCIAL HISTORY:  The patient is a 77 year old, healthy-appearing, white  male.  Denies any history of smoking.  He does have an occasional alcohol  beverage.  He is currently married.  He lives in a two-story house with 16  steps to the main entrance with living facilities on one floor.  He is  currently a retired Location manager.   FAMILY PHYSICIAN:  Georga Hacking, M.D., at [phone].   FAMILY MEDICAL HISTORY:  His mother is deceased from an unknown type of  cancer.  His father is deceased from heart disease.  The patient has one  brother with hypertension.   REVIEW OF SYMPTOMS:  Negative in all categories with the exception that he  does wear glasses at all times.  He does have a hearing aid for his left  ear, but he does not use it frequently.  His last flare up of gout was in  his elbows earlier this year since being managed on allopurinol and  colchicine.   PHYSICAL EXAMINATION:  HEIGHT:  5 feet 10 inches.  WEIGHT:  220 pounds.  VITAL SIGNS:  Blood pressure 132/76, temperature 97.2 degrees, heart rate  84, respirations 12.  GENERAL APPEARANCE:  This is a healthy-appearing, well-developed, white  male.  Looks physically fit.  Ambulates without any significant alteration  in gait.  He is able to get himself on and off the exam table without any  obvious distress.  HEENT:  The head was normocephalic, atraumatic, and nontender over maxillary  or frontal sinuses.  Pupils equal, round, and reactive to light.  Extraocular motions intact.  The sclerae are nonicteric.  The conjunctivae  are pink and moist.  The external ears are without deformities.  Canals  patent.  The left TM was extremely sclerotic and had a perforation.  The  right TM was sclerotic  appearing with a yellowish fluid below the lower one-  half.  Gross hearing was intact.  The nasal septum was midline.  Mucous  membranes pink and moist.  No polyps.  The oral buccal mucosa was pink,  moist, and without lesions.  There are no signs of increased pharyngeal  erythema.  He was able to swallow without any difficulty.  Dentition was in  good repair.  The uvula was midline.  NECK:  Supple.  No palpable lymphadenopathy.  The thyroid gland was  nontender.  He had good range of motion of his cervical spine without any  difficulty or tenderness.  No tenderness to percussion along the entire  spinal column.  CHEST:  Lung sounds were clear and equal bilaterally.  No wheezes, rales,  rhonchi, or rubs noted.  HEART:  Regular rate and rhythm.  S1 and S2 were auscultated.  No murmurs,  rubs, or gallops noted.  ABDOMEN:  Round, soft, and nontender.  No hepatosplenomegaly.  CVA was  nontender to percussion.  Bowel sounds were normoactive throughout.  EXTREMITIES:  The upper extremities were symmetric in size and shape.  He  had excellent range of motion of his shoulders, elbows, and wrists without  any difficulty.  Motor strength was 5/5.  The lower extremities showed that  the right and left hips had full extension and flexion up to 130 degrees and  20-30 degrees internal and external rotation on the right, but he only had  10 degrees of external rotation on the left with 20 degrees of internal  rotation.  The patient denied any discomfort or mechanical symptoms.  Bilateral knees had full extension and flexion back to 120.  The right knee  had a well-healed midline incision, no effusion, and no erythema.  He had no  instability.  The left knee had no sign of erythema and no effusion.  He was  nontender along the medial and lateral joint line.  He had no significant  mediolateral instability.  Bilateral calves were nontender.  The ankles were symmetrical with good dorsiflexion and plantar  flexion.  PERIPHERAL VASCULATURE:  The carotid pulses were 2+ with no bruits.  The  radial pulses were 2+.  Dorsalis pedis and posterior tibials were 1+.  No  lower extremity edema or venous stasis changes noted.  NEUROLOGIC:  The patient was conscious, alert, and appropriate.  He held an  easy conversation with the examiner.  Cranial nerves II-XII were grossly  intact.  The patient was grossly intact to light touch and sensation from  head to toe.  He had no neurologic deficits noted.  BREASTS:  Deferred at this time.  RECTAL:  Deferred at this time.  GENITOURINARY:  Deferred at this time.   IMPRESSION:  1. Severe osteoarthritis, left knee.  2. Hypertension.  3. History of myocardial infarction with three vessel bypass.  4. Gout.  5. Hypercholesterolemia.  6. Benign prostatic hypertrophy.   PLAN:  The patient will be admitted to [facility] on  January 17, 2002, under the care of Mila Homer. Sherlean Foot, M.D.  The patient has  notified W. Viann Fish, M.D., and he has been cleared for surgery from  his standpoint.  The patient will undergo all routine labs and tests prior  to undergoing a left total knee arthroplasty.  The patient has donated two  units of autologous blood for this procedure.      Jamelle Rushing, Arnetha Courser, MD    RWK/MEDQ  D:  01/11/2002  T:  01/11/2002  Job:  [id number]

## 2010-10-06 ENCOUNTER — Other Ambulatory Visit: Payer: Self-pay

## 2010-10-13 ENCOUNTER — Other Ambulatory Visit (INDEPENDENT_AMBULATORY_CARE_PROVIDER_SITE_OTHER): Payer: Federal, State, Local not specified - PPO

## 2010-10-13 ENCOUNTER — Ambulatory Visit: Payer: Self-pay | Admitting: Vascular Surgery

## 2010-10-13 DIAGNOSIS — H349 Unspecified retinal vascular occlusion: Secondary | ICD-10-CM

## 2010-10-23 NOTE — Procedures (Unsigned)
CAROTID DUPLEX EXAM  INDICATION:  Rule out retinal occlusion.  HISTORY: Diabetes:  No. Cardiac:  MI, atrial fibrillation, CHF. Hypertension:  Yes. Smoking:  No. Previous Surgery:  No. CV History:  Dizziness. Amaurosis Fugax No, Paresthesias No, Hemiparesis No. Other:  Hyperlipidemia.                                      RIGHT             LEFT Brachial systolic pressure:         104               108 Brachial Doppler waveforms:         Normal            Normal Vertebral direction of flow:        Antegrade         Antegrade DUPLEX VELOCITIES (cm/sec) CCA peak systolic                   50                49 ECA peak systolic                   50                59 ICA peak systolic                   56                48 ICA end diastolic                   28                24 PLAQUE MORPHOLOGY:                  Calcific          Calcific PLAQUE AMOUNT:                      Minimal           Minimal PLAQUE LOCATION:                    Bifurcation, ICA  Bifurcation  IMPRESSION: 1. Bilateral internal carotid artery velocities suggest 1% to 39%     stenosis. 2. Antegrade vertebral arteries bilaterally. 3. Of note, patient recently started blood pressure medications that     have lowered his blood pressure significantly.  ___________________________________________ Janetta Hora. Fields, MD  EM/MEDQ  D:  10/14/2010  T:  10/14/2010  Job:  [id number]

## 2010-12-09 LAB — COMPREHENSIVE METABOLIC PANEL
BUN: 23
CO2: 31
Calcium: 9.1
Chloride: 105
Creatinine, Ser: 1.46
GFR calc Af Amer: 57 — ABNORMAL LOW
GFR calc non Af Amer: 47 — ABNORMAL LOW
Total Bilirubin: 0.7

## 2010-12-09 LAB — CBC
HCT: 37.5 — ABNORMAL LOW
MCHC: 34.1
MCV: 98.9
Platelets: 113 — ABNORMAL LOW
RBC: 3.79 — ABNORMAL LOW
WBC: 6.5

## 2010-12-09 LAB — DIFFERENTIAL
Basophils Absolute: 0
Lymphocytes Relative: 19
Lymphs Abs: 1.2
Neutro Abs: 4.4

## 2010-12-09 LAB — PROTIME-INR
INR: 2.1 — ABNORMAL HIGH
Prothrombin Time: 24.5 — ABNORMAL HIGH

## 2010-12-09 LAB — MAGNESIUM: Magnesium: 2.3

## 2010-12-10 LAB — PROTIME-INR
INR: 1.9 — ABNORMAL HIGH
Prothrombin Time: 22.8 — ABNORMAL HIGH

## 2010-12-14 LAB — BASIC METABOLIC PANEL
CO2: 26
Chloride: 106
GFR calc non Af Amer: 45 — ABNORMAL LOW
Glucose, Bld: 115 — ABNORMAL HIGH
Potassium: 4.1
Sodium: 142

## 2010-12-14 LAB — CBC
HCT: 35.8 — ABNORMAL LOW
Hemoglobin: 12.4 — ABNORMAL LOW
MCV: 97.5
RDW: 14

## 2010-12-15 LAB — PROTIME-INR: Prothrombin Time: 31.7 — ABNORMAL HIGH

## 2010-12-15 LAB — BASIC METABOLIC PANEL
CO2: 28
Chloride: 108
Creatinine, Ser: 1.76 — ABNORMAL HIGH
GFR calc Af Amer: 46 — ABNORMAL LOW
Glucose, Bld: 107 — ABNORMAL HIGH

## 2010-12-15 LAB — CBC
Hemoglobin: 11.6 — ABNORMAL LOW
MCHC: 35
MCV: 96.7
RBC: 3.42 — ABNORMAL LOW

## 2010-12-24 ENCOUNTER — Encounter: Payer: Self-pay | Admitting: Internal Medicine

## 2010-12-25 ENCOUNTER — Ambulatory Visit (INDEPENDENT_AMBULATORY_CARE_PROVIDER_SITE_OTHER): Payer: Federal, State, Local not specified - PPO | Admitting: Internal Medicine

## 2010-12-25 ENCOUNTER — Encounter: Payer: Self-pay | Admitting: Internal Medicine

## 2010-12-25 DIAGNOSIS — Z95810 Presence of automatic (implantable) cardiac defibrillator: Secondary | ICD-10-CM

## 2010-12-25 DIAGNOSIS — I428 Other cardiomyopathies: Secondary | ICD-10-CM

## 2010-12-25 DIAGNOSIS — I5022 Chronic systolic (congestive) heart failure: Secondary | ICD-10-CM

## 2010-12-25 DIAGNOSIS — I4891 Unspecified atrial fibrillation: Secondary | ICD-10-CM

## 2010-12-25 LAB — ICD DEVICE OBSERVATION
CHARGE TIME: 9.148 s
DEV-0020ICD: NEGATIVE
RV LEAD AMPLITUDE: 16.928 mv
TOT-0001: 2
TOT-0006: 20090902000000
TZAT-0012FASTVT: 200 ms
TZAT-0018SLOWVT: NEGATIVE
TZAT-0019SLOWVT: 8 V
TZAT-0020FASTVT: 1.5 ms
TZAT-0020SLOWVT: 1.5 ms
TZON-0003SLOWVT: 400 ms
TZST-0001FASTVT: 2
TZST-0001FASTVT: 3
TZST-0001SLOWVT: 3
TZST-0001SLOWVT: 5
TZST-0001SLOWVT: 6
TZST-0002FASTVT: NEGATIVE
TZST-0002FASTVT: NEGATIVE
TZST-0002FASTVT: NEGATIVE
TZST-0002SLOWVT: NEGATIVE
TZST-0002SLOWVT: NEGATIVE
VENTRICULAR PACING ICD: 0.04 pct

## 2010-12-25 NOTE — Assessment & Plan Note (Signed)
His device is working normally. We'll plan to recheck next year.

## 2010-12-25 NOTE — Assessment & Plan Note (Signed)
His symptoms are class II. He will continue a low sodium diet and his current medical therapy 

## 2010-12-25 NOTE — Progress Notes (Signed)
HPI Mr. Sleeth returns today for followup. He has a history of an ischemic cardiomyopathy status post coronary artery bypass grafting, history of chronic atrial fibrillation, a history of chronic systolic heart failure class II. The patient is status post prophylactic ICD implantation. He denies any recent ICD shocks. He denies chest pain, peripheral edema, or syncope. He has class II congestive heart failure symptoms. He is limited by activity because of arthritis in his knees. No Known Allergies   Current Outpatient Prescriptions  Medication Sig Dispense Refill  . allopurinol (ZYLOPRIM) 100 MG tablet Take 300 mg by mouth daily.        Marland Kitchen aspirin 81 MG tablet Take 81 mg by mouth daily.        . colchicine 0.6 MG tablet Take 0.6 mg by mouth daily.        . dabigatran (PRADAXA) 150 MG CAPS Take 150 mg by mouth 2 (two) times daily.        . ferrous fumarate (HEMOCYTE - 106 MG FE) 325 (106 FE) MG TABS Take 1 tablet by mouth daily.        . furosemide (LASIX) 40 MG tablet Take 40 mg by mouth 2 (two) times daily.        . metoprolol (LOPRESSOR) 50 MG tablet Take 50 mg by mouth 2 (two) times daily.        . multivitamin (THERAGRAN) per tablet Take 1 tablet by mouth daily.        . potassium chloride SA (K-DUR,KLOR-CON) 20 MEQ tablet Take 20 mEq by mouth daily.        . rosuvastatin (CRESTOR) 20 MG tablet Take 20 mg by mouth daily.        Marland Kitchen terazosin (HYTRIN) 5 MG capsule Take 5 mg by mouth at bedtime.        . valsartan (DIOVAN) 160 MG tablet Take 80 mg by mouth daily.       . vitamin C (ASCORBIC ACID) 500 MG tablet Take 500 mg by mouth daily.           Past Medical History  Diagnosis Date  . Gout   . Hypertension   . Osteoarthritis   . Arrhythmia     afib  . CHF (congestive heart failure)   . Cardiomyopathy     ischemic  . Coronary artery disease   . Heart attack     ROS:   All systems reviewed and negative except as noted in the HPI.   Past Surgical History  Procedure Date  .  Coronary artery bypass graft     triple bypass in June 1985  . Mastectomy, partial     left breast 1991  . Arthroplasty   . Anginoplasty     1995  . Icd      Family History  Problem Relation Age of Onset  . Heart attack Father      History   Social History  . Marital Status: Married    Spouse Name: N/A    Number of Children: N/A  . Years of Education: N/A   Occupational History  . Not on file.   Social History Main Topics  . Smoking status: Never Smoker   . Smokeless tobacco: Not on file  . Alcohol Use: Yes  . Drug Use: Not on file  . Sexually Active: Not on file   Other Topics Concern  . Not on file   Social History Narrative  . No narrative on file  BP 127/82  Pulse 74  Ht 5\' 7"  (1.702 m)  Wt 215 lb (97.523 kg)  BMI 33.67 kg/m2  Physical Exam:  Well appearing man, NAD HEENT: Unremarkable Neck:  No JVD, no thyromegally Lymphatics:  No adenopathy Back:  No CVA tenderness Lungs:  Clear with no wheezes, rales, or rhonchi. Well-healed ICD incision. HEART:  Regular rate rhythm, no murmurs, no rubs, no clicks Abd:  soft, positive bowel sounds, no organomegally, no rebound, no guarding Ext:  2 plus pulses, no edema, no cyanosis, no clubbing Skin:  No rashes no nodules Neuro:  CN II through XII intact, motor grossly intact  DEVICE  Normal device function.  See PaceArt for details.   Assess/Plan:

## 2010-12-25 NOTE — Assessment & Plan Note (Signed)
Interrogation of his device demonstrates that his ventricular rates appear to be well controlled. He'll continue his current medication.

## 2010-12-25 NOTE — Patient Instructions (Signed)
Your physician wants you to follow-up in: YEAR WITH DR TAYLOR You will receive a reminder letter in the mail two months in advance. If you don't receive a letter, please call our office to schedule the follow-up appointment. Your physician recommends that you continue on your current medications as directed. Please refer to the Current Medication list given to you today. 

## 2011-02-05 ENCOUNTER — Telehealth: Payer: Self-pay | Admitting: Oncology

## 2011-02-05 ENCOUNTER — Other Ambulatory Visit: Payer: Self-pay | Admitting: Oncology

## 2011-02-05 ENCOUNTER — Encounter: Payer: Self-pay | Admitting: Oncology

## 2011-02-05 DIAGNOSIS — D696 Thrombocytopenia, unspecified: Secondary | ICD-10-CM | POA: Insufficient documentation

## 2011-02-05 DIAGNOSIS — D693 Immune thrombocytopenic purpura: Secondary | ICD-10-CM

## 2011-02-05 NOTE — Telephone Encounter (Signed)
Called pt x2 today ,left message to call us back within 72 hours, otherwise referral will be send back to Dr Pete Glatter

## 2011-02-09 ENCOUNTER — Telehealth: Payer: Self-pay | Admitting: Oncology

## 2011-02-09 ENCOUNTER — Other Ambulatory Visit: Payer: Federal, State, Local not specified - PPO

## 2011-02-09 ENCOUNTER — Ambulatory Visit: Payer: Federal, State, Local not specified - PPO | Admitting: Oncology

## 2011-02-09 NOTE — Telephone Encounter (Signed)
Called pt again today , left message again on VM

## 2011-03-12 ENCOUNTER — Telehealth: Payer: Self-pay | Admitting: Oncology

## 2011-03-12 NOTE — Telephone Encounter (Signed)
Pt came in today stating he has an appt w/JG today. Pt is not on schedule and was FTKA for 11/20 appt. Pt given new appt for 1/9. Tiffany will get chart to [facility].

## 2011-03-25 ENCOUNTER — Encounter: Payer: Federal, State, Local not specified - PPO | Admitting: *Deleted

## 2011-03-29 ENCOUNTER — Encounter: Payer: Self-pay | Admitting: *Deleted

## 2011-03-30 ENCOUNTER — Other Ambulatory Visit: Payer: Self-pay | Admitting: Oncology

## 2011-03-30 DIAGNOSIS — D693 Immune thrombocytopenic purpura: Secondary | ICD-10-CM

## 2011-03-31 ENCOUNTER — Other Ambulatory Visit (HOSPITAL_BASED_OUTPATIENT_CLINIC_OR_DEPARTMENT_OTHER): Payer: Federal, State, Local not specified - PPO | Admitting: Lab

## 2011-03-31 ENCOUNTER — Ambulatory Visit (HOSPITAL_BASED_OUTPATIENT_CLINIC_OR_DEPARTMENT_OTHER): Payer: Federal, State, Local not specified - PPO | Admitting: Oncology

## 2011-03-31 ENCOUNTER — Ambulatory Visit (HOSPITAL_BASED_OUTPATIENT_CLINIC_OR_DEPARTMENT_OTHER): Payer: Federal, State, Local not specified - PPO

## 2011-03-31 VITALS — BP 126/71 | HR 78 | Temp 97.1°F | Ht 68.0 in | Wt 217.3 lb

## 2011-03-31 DIAGNOSIS — Z853 Personal history of malignant neoplasm of breast: Secondary | ICD-10-CM

## 2011-03-31 DIAGNOSIS — Z9221 Personal history of antineoplastic chemotherapy: Secondary | ICD-10-CM

## 2011-03-31 DIAGNOSIS — D6959 Other secondary thrombocytopenia: Secondary | ICD-10-CM

## 2011-03-31 DIAGNOSIS — D539 Nutritional anemia, unspecified: Secondary | ICD-10-CM

## 2011-03-31 DIAGNOSIS — D696 Thrombocytopenia, unspecified: Secondary | ICD-10-CM

## 2011-03-31 DIAGNOSIS — D693 Immune thrombocytopenic purpura: Secondary | ICD-10-CM

## 2011-03-31 LAB — CBC & DIFF AND RETIC
BASO%: 0.4 % (ref 0.0–2.0)
Basophils Absolute: 0 10*3/uL (ref 0.0–0.1)
EOS%: 1.8 % (ref 0.0–7.0)
Immature Retic Fract: 7.1 % (ref 3.00–10.60)
LYMPH%: 15.3 % (ref 14.0–49.0)
MCHC: 33.7 g/dL (ref 32.0–36.0)
MCV: 100.5 fL — ABNORMAL HIGH (ref 79.3–98.0)
NEUT#: 5.2 10*3/uL (ref 1.5–6.5)
NEUT%: 72.1 % (ref 39.0–75.0)
WBC: 7.2 10*3/uL (ref 4.0–10.3)

## 2011-03-31 LAB — COMPREHENSIVE METABOLIC PANEL
Albumin: 4.1 g/dL (ref 3.5–5.2)
BUN: 25 mg/dL — ABNORMAL HIGH (ref 6–23)
CO2: 28 mEq/L (ref 19–32)
Calcium: 9 mg/dL (ref 8.4–10.5)
Chloride: 101 mEq/L (ref 96–112)
Glucose, Bld: 122 mg/dL — ABNORMAL HIGH (ref 70–99)
Potassium: 4.6 mEq/L (ref 3.5–5.3)

## 2011-03-31 LAB — LACTATE DEHYDROGENASE: LDH: 179 U/L (ref 94–250)

## 2011-03-31 LAB — MORPHOLOGY

## 2011-03-31 NOTE — Progress Notes (Signed)
New patient hematology evaluation   Martin [name] [id number] 07-22-1930 78 y.o. 03/31/2011  CC: Dr. Merlene Laughter; Dr. Viann Fish; Dr Glenford Peers   Reason for referral: Evaluate chronic thrombocytopenia    HPI:  78 year old retired from the [employer] man with history of left breast cancer diagnosed in 1991 and treated by one of my partners Dr. Mariel Sleet. Patient believes he was lymph node negative. He was treated with a mastectomy and then had chemotherapy followed by tamoxifen hormonal therapy. He does not remember which chemotherapy drugs he received. He was first told he was thrombocytopenic subsequent to completion of his chemotherapy. Platelet counts have remained stable for 20 years now. Counts to fluctuate when he has acute illness. I have dated back as far as 2009 with lowest platelet count recorded back year of 92,000. Counts provided by his primary care physician go back as far as 12/29/2009 when platelet count was 88,000. Subsequent to the counts in the 74-111,000 range except for 1 outlier from 01/28/11 which was recorded as 30,000. Repeat on 11/27 was 74,000. Platelet count in our office today is 109,000. He has had no problems with multiple surgeries in the past including bilateral knee replacements and a left hip replacement. He does not bruise easily. He had coronary bypass surgery in 1985 which antedated his breast cancer diagnosis and chemotherapy treatments. He denies any history of hepatitis, yellow jaundice, malaria, or mononucleosis. He is on a number of medications that could affect platelet function including chronic colchicine and allopurinol. He admits to 2 large drinks daily with either bourbon or gin. He has a chronically elevated MCV up to 103. There is no family history of any bleeding disorder. He is on anticoagulation with Pradaxa for chronic nature fibrillation and is status post a implantable defibrillator.   PMH: Past Medical History    Diagnosis Date  . Gout   . Hypertension   . Osteoarthritis   . Arrhythmia     afib  . CHF (congestive heart failure)   . Cardiomyopathy     ischemic  . Coronary artery disease   . Heart attack   . Thrombocytopenia 02/05/2011  Benign prostatic hypertrophy.   Past Surgical History  Procedure Date  . Coronary artery bypass graft     triple bypass in June 1985  . Mastectomy, partial     left breast 1991  . Arthroplasty   . Anginoplasty     1995  . Icd    bilateral total knee replacements. Left hip  replacement 2006 Bilateral cataract surgery  Implantable defibrillator. Tonsillectomy as a child per  Allergies: No Known Allergies  Medications: Medications Prior to Admission  Medication Sig Dispense Refill  . allopurinol (ZYLOPRIM) 100 MG tablet Take 300 mg by mouth daily.        Marland Kitchen aspirin 81 MG tablet Take 81 mg by mouth daily.        . colchicine 0.6 MG tablet Take 0.6 mg by mouth daily.        . dabigatran (PRADAXA) 150 MG CAPS Take 150 mg by mouth 2 (two) times daily.        . ferrous fumarate (HEMOCYTE - 106 MG FE) 325 (106 FE) MG TABS Take 1 tablet by mouth daily.        . furosemide (LASIX) 40 MG tablet Take 40 mg by mouth 2 (two) times daily.        . metoprolol (LOPRESSOR) 50 MG tablet Take 50 mg by mouth 2 (two)  times daily.        . multivitamin (THERAGRAN) per tablet Take 1 tablet by mouth daily.        . potassium chloride SA (K-DUR,KLOR-CON) 20 MEQ tablet Take 20 mEq by mouth daily.        Marland Kitchen terazosin (HYTRIN) 5 MG capsule Take 5 mg by mouth at bedtime.        . valsartan (DIOVAN) 160 MG tablet Take 80 mg by mouth daily.       . vitamin C (ASCORBIC ACID) 500 MG tablet Take 500 mg by mouth daily.        Marland Kitchen FLUVIRIN INJ injection       . rosuvastatin (CRESTOR) 20 MG tablet Take 20 mg by mouth daily.         No current facility-administered medications on file as of 03/31/2011.    Social History:   He would for the [employer]. He used to smoke  but stopped in 1966. He drinks 2 large bourbon origin drinks daily.. . He reports that he drinks alcohol. His drug history not on file. He has 3 healthy sons.  Family History: Family History  Problem Relation Age of Onset  . Heart attack Father    he had one sibling a brother who died at age 77 of metastatic cancer to bone of unknown primary  Review of Systems: Constitutional ROS: None Cardiovascular ROS: Currently no chest pain pressure or palpitations  Respiratory ROS: No cough or dyspnea Neurological ROS: No headache or change in vision no paresthesias; decreased hearing and he wears hearing aids Dermatological ROS: No rash or ecchymoses ENT ROS: Not question Gastrointestinal ROS: No change in bowel habit Genito-Urinary ROS: No urinary tract Hematological and Lymphatic ROS: Breast ROS: No new lumps Musculoskeletal ROS: No pain except related to arthritis Remaining ROS negative.  Physical Exam: Blood pressure 126/71, pulse 78, temperature 97.1 F (36.2 C), temperature source Oral, height 5\' 8"  (1.727 m), weight 217 lb 4.8 oz (98.567 kg). Wt Readings from Last 3 Encounters:  03/31/11 217 lb 4.8 oz (98.567 kg)  12/25/10 215 lb (97.523 kg)  12/23/09 215 lb (97.523 kg)    General appearance: Well-nourished Caucasian man Head: Normal Neck: Normal Lymph nodes: No adenopathy Breasts: Left mastectomy no chest wall masses Lungs: Clear to auscultation resonant to percussion Heart: Regular cardiac rhythm no murmur or gallop implantable battery pack for defibrillator subcutaneous tissues left subclavian position; scar right subclavian position from previous Port-A-Cath infusion device. Midline chest scar from previous coronary bypass surgery. Abdominal: Soft nontender no mass no organomegaly GU: Not examed Extremities: No edema no calf tenderness Neurologic: No focal deficit Skin: No rash or ecchymosis    Lab Results: Lab Results  Component Value Date   WBC 7.2 03/31/2011   HGB  12.9* 03/31/2011   HCT 38.3* 03/31/2011   MCV 100.5* 03/31/2011   PLT 109* 03/31/2011     Chemistry      Component Value Date/Time   NA 141 03/31/2011 0946   K 4.6 03/31/2011 0946   CL 101 03/31/2011 0946   CO2 28 03/31/2011 0946   BUN 25* 03/31/2011 0946   CREATININE 1.69* 03/31/2011 0946      Component Value Date/Time   CALCIUM 9.0 03/31/2011 0946   ALKPHOS 54 03/31/2011 0946   AST 28 03/31/2011 0946   ALT 19 03/31/2011 0946   BILITOT 0.8 03/31/2011 0946       Pathology: Radiological Studies: No results found.  Review of the peripheral  blood film: Normochromic normocytic red cells. Immature white cells. Platelets slightly decreased in number and normal in morphology.   Impression and Plan: Chronic thrombocytopenia related to previous chemotherapy treatments for male breast cancer. I cannot explain the transient fall in his platelet count down to 30,000 back in November 2012. Majority of his blood counts have been 100,000 or above. He has a mild macrocytic anemia but is a fairly heavy alcohol user. He has a normal white count and differential. He likely has a mild myelodysplastic syndrome related to his previous chemotherapy treatments to explain the chronic mild anemia and thrombocytopenia. We reviewed the fact that many medications can cause bone marrow suppression. I would be particularly concerned with long-term use of both allopurinol and colchicine. We reviewed the bone marrow suppressive effects of alcohol. Based on his history and chronicity of changes, I do not feel that this is an immune thrombocytopenia.  Recommendations: Observation alone. Moderate alcohol use. Consider stopping the colchicine and allopurinol and using these drugs only intermittently. I did not schedule a formal revisit but would be happy to see him again in the future if the need arises.      Levert Feinstein, MD 03/31/2011, 2:54 PM

## 2011-04-23 ENCOUNTER — Other Ambulatory Visit: Payer: Self-pay | Admitting: Cardiology

## 2011-05-03 ENCOUNTER — Other Ambulatory Visit: Payer: Self-pay | Admitting: Oncology

## 2011-05-03 DIAGNOSIS — D693 Immune thrombocytopenic purpura: Secondary | ICD-10-CM

## 2011-05-04 ENCOUNTER — Telehealth: Payer: Self-pay | Admitting: Oncology

## 2011-05-04 NOTE — Telephone Encounter (Signed)
Called pt , left message , regarding lab, to call us back if time and date not, good

## 2011-05-05 ENCOUNTER — Other Ambulatory Visit (HOSPITAL_BASED_OUTPATIENT_CLINIC_OR_DEPARTMENT_OTHER): Payer: Federal, State, Local not specified - PPO | Admitting: Lab

## 2011-05-05 DIAGNOSIS — D6959 Other secondary thrombocytopenia: Secondary | ICD-10-CM

## 2011-05-05 DIAGNOSIS — D693 Immune thrombocytopenic purpura: Secondary | ICD-10-CM

## 2011-05-05 LAB — CBC WITH DIFFERENTIAL/PLATELET
BASO%: 0.5 % (ref 0.0–2.0)
Basophils Absolute: 0 10*3/uL (ref 0.0–0.1)
EOS%: 2.4 % (ref 0.0–7.0)
Eosinophils Absolute: 0.2 10*3/uL (ref 0.0–0.5)
HCT: 35.1 % — ABNORMAL LOW (ref 38.4–49.9)
HGB: 12.1 g/dL — ABNORMAL LOW (ref 13.0–17.1)
LYMPH%: 15.9 % (ref 14.0–49.0)
MCH: 34.7 pg — ABNORMAL HIGH (ref 27.2–33.4)
MCHC: 34.5 g/dL (ref 32.0–36.0)
MCV: 100.6 fL — ABNORMAL HIGH (ref 79.3–98.0)
MONO#: 0.6 10*3/uL (ref 0.1–0.9)
MONO%: 8.7 % (ref 0.0–14.0)
NEUT#: 4.7 10*3/uL (ref 1.5–6.5)
NEUT%: 72.5 % (ref 39.0–75.0)
Platelets: 86 10*3/uL — ABNORMAL LOW (ref 140–400)
RBC: 3.49 10*6/uL — ABNORMAL LOW (ref 4.20–5.82)
RDW: 13.7 % (ref 11.0–14.6)
WBC: 6.5 10*3/uL (ref 4.0–10.3)
lymph#: 1 10*3/uL (ref 0.9–3.3)

## 2011-05-05 LAB — MORPHOLOGY: PLT EST: DECREASED

## 2011-05-06 ENCOUNTER — Telehealth: Payer: Self-pay | Admitting: *Deleted

## 2011-05-06 NOTE — Telephone Encounter (Signed)
Message copied by Sabino Snipes on Thu May 06, 2011 10:39 AM ------      Message from: Levert Feinstein      Created: Wed May 05, 2011  2:03 PM       Call patient - platelet count 86,000.   Part of problem is that platelets clump when they hit the air so count done through Dr York Spaniel office not accurate. This will underestimate true count.      Route copy to Dr Viann Fish please

## 2011-05-06 NOTE — Telephone Encounter (Signed)
Pt was notified that platelet count done yest was 86K & per Dr. Cyndie Chime states that part of the problem is that platelets clump when they hit the air so count done through Dr. York Spaniel office wasn't accurate & this would underestimate the true count.  This lab result will be routed to Dr. Donnie Aho.

## 2011-12-28 ENCOUNTER — Encounter: Payer: Self-pay | Admitting: *Deleted

## 2012-01-04 ENCOUNTER — Ambulatory Visit (INDEPENDENT_AMBULATORY_CARE_PROVIDER_SITE_OTHER): Payer: Federal, State, Local not specified - PPO | Admitting: Internal Medicine

## 2012-01-04 ENCOUNTER — Encounter: Payer: Self-pay | Admitting: Internal Medicine

## 2012-01-04 VITALS — BP 102/70 | HR 87 | Ht 68.0 in | Wt 215.4 lb

## 2012-01-04 DIAGNOSIS — I5022 Chronic systolic (congestive) heart failure: Secondary | ICD-10-CM

## 2012-01-04 DIAGNOSIS — I2589 Other forms of chronic ischemic heart disease: Secondary | ICD-10-CM

## 2012-01-04 DIAGNOSIS — Z95810 Presence of automatic (implantable) cardiac defibrillator: Secondary | ICD-10-CM

## 2012-01-04 LAB — ICD DEVICE OBSERVATION
BATTERY VOLTAGE: 3.14 V
RV LEAD THRESHOLD: 0.5 V
TZAT-0001SLOWVT: 1
TZAT-0002FASTVT: NEGATIVE
TZAT-0012SLOWVT: 200 ms
TZAT-0018SLOWVT: NEGATIVE
TZAT-0019FASTVT: 8 V
TZAT-0019SLOWVT: 8 V
TZAT-0020SLOWVT: 1.5 ms
TZON-0003SLOWVT: 400 ms
TZON-0003VSLOWVT: 350 ms
TZON-0004SLOWVT: 24
TZON-0004VSLOWVT: 28
TZON-0005SLOWVT: 12
TZST-0001FASTVT: 2
TZST-0001FASTVT: 3
TZST-0001SLOWVT: 2
TZST-0001SLOWVT: 6
TZST-0002FASTVT: NEGATIVE
TZST-0002FASTVT: NEGATIVE
TZST-0002FASTVT: NEGATIVE
TZST-0002SLOWVT: NEGATIVE
TZST-0002SLOWVT: NEGATIVE
TZST-0002SLOWVT: NEGATIVE
VENTRICULAR PACING ICD: 1 pct

## 2012-01-04 NOTE — Progress Notes (Signed)
HPI Mr.Steffek returns today for followup. He is a very pleasant 78 year old man with a history of an ischemic cardiomyopathy and chronic atrial fibrillation. In the interim, he has done well. He is status post ICD implantation and has had no recent ICD shocks. He denies syncope or peripheral edema. No Known Allergies   Current Outpatient Prescriptions  Medication Sig Dispense Refill  . allopurinol (ZYLOPRIM) 100 MG tablet Take 300 mg by mouth daily.        Marland Kitchen aspirin 81 MG tablet Take 81 mg by mouth daily.        . colchicine 0.6 MG tablet Take 0.6 mg by mouth daily.        . dabigatran (PRADAXA) 150 MG CAPS Take 150 mg by mouth 2 (two) times daily.        . ferrous fumarate (HEMOCYTE - 106 MG FE) 325 (106 FE) MG TABS Take 1 tablet by mouth daily.        Marland Kitchen FLUVIRIN INJ injection       . furosemide (LASIX) 40 MG tablet Take 40 mg by mouth 2 (two) times daily.        . metoprolol (LOPRESSOR) 50 MG tablet Take 50 mg by mouth 2 (two) times daily.        . multivitamin (THERAGRAN) per tablet Take 1 tablet by mouth daily.        . potassium chloride SA (K-DUR,KLOR-CON) 20 MEQ tablet Take 20 mEq by mouth daily.        . rosuvastatin (CRESTOR) 20 MG tablet Take 20 mg by mouth daily.        Marland Kitchen terazosin (HYTRIN) 5 MG capsule Take 5 mg by mouth at bedtime.        . valsartan (DIOVAN) 160 MG tablet Take 80 mg by mouth daily.       . vitamin C (ASCORBIC ACID) 500 MG tablet Take 500 mg by mouth daily.           Past Medical History  Diagnosis Date  . Gout   . Hypertension   . Osteoarthritis   . Arrhythmia     afib  . CHF (congestive heart failure)   . Cardiomyopathy     ischemic  . Coronary artery disease   . Heart attack   . Thrombocytopenia, immune 02/05/2011    ROS:   All systems reviewed and negative except as noted in the HPI.   Past Surgical History  Procedure Date  . Coronary artery bypass graft     triple bypass in June 1985  . Mastectomy, partial     left breast 1991  .  Arthroplasty   . Anginoplasty     1995  . Icd      Family History  Problem Relation Age of Onset  . Heart attack Father      History   Social History  . Marital Status: Married    Spouse Name: N/A    Number of Children: N/A  . Years of Education: N/A   Occupational History  . Not on file.   Social History Main Topics  . Smoking status: Former Smoker -- 2.0 packs/day for 10 years    Types: Cigarettes    Quit date: 03/22/1964  . Smokeless tobacco: Not on file  . Alcohol Use: Yes  . Drug Use: Not on file  . Sexually Active: Not on file   Other Topics Concern  . Not on file   Social History Narrative  . No narrative  on file     BP 102/70  Pulse 87  Ht 5\' 8"  (1.727 m)  Wt 215 lb 6.4 oz (97.705 kg)  BMI 32.75 kg/m2  SpO2 97%  Physical Exam:  Well appearing 78 year old man, NAD HEENT: Unremarkable Neck:  No JVD, no thyromegally Lungs:  Clear with no wheezes, rales, or rhonchi. HEART:  Regular rate rhythm, no murmurs, no rubs, no clicks Abd:  soft, positive bowel sounds, no organomegally, no rebound, no guarding Ext:  2 plus pulses, no edema, no cyanosis, no clubbing Skin:  No rashes no nodules Neuro:  CN II through XII intact, motor grossly intact  DEVICE  Normal device function.  See PaceArt for details.   Assess/Plan:

## 2012-01-04 NOTE — Patient Instructions (Signed)
Your physician wants you to follow-up in: 12 months with Dr. Taylor. You will receive a reminder letter in the mail two months in advance. If you don't receive a letter, please call our office to schedule the follow-up appointment.    

## 2012-01-04 NOTE — Assessment & Plan Note (Signed)
His heart failure symptoms are class II. He will continue his current medical therapy and maintain a low-sodium diet.

## 2012-01-04 NOTE — Assessment & Plan Note (Signed)
His Medtronic single-chamber defibrillator is working normally. We'll plan to recheck in several months. 

## 2012-01-18 ENCOUNTER — Encounter: Payer: Self-pay | Admitting: Internal Medicine

## 2012-03-22 DIAGNOSIS — Z1501 Genetic susceptibility to malignant neoplasm of breast: Secondary | ICD-10-CM

## 2012-03-22 HISTORY — DX: Genetic susceptibility to malignant neoplasm of breast: Z15.01

## 2012-10-05 ENCOUNTER — Encounter: Payer: Self-pay | Admitting: Cardiology

## 2012-10-12 ENCOUNTER — Encounter: Payer: Self-pay | Admitting: Cardiology

## 2012-10-26 ENCOUNTER — Encounter: Payer: Self-pay | Admitting: Internal Medicine

## 2012-10-30 ENCOUNTER — Encounter: Payer: Self-pay | Admitting: Internal Medicine

## 2012-12-15 ENCOUNTER — Telehealth: Payer: Self-pay | Admitting: Oncology

## 2012-12-15 ENCOUNTER — Telehealth: Payer: Self-pay | Admitting: *Deleted

## 2012-12-15 NOTE — Telephone Encounter (Signed)
Pt called and wants to see Md for surgery clearance for 10/10 hip replacement, waiting for answer

## 2012-12-15 NOTE — Telephone Encounter (Signed)
Received fax for clearance for right total hip replacement for 12/29/12.   Dr. Cyndie Chime: " This man seen for a one time office consult 03-31-11.  I can not clear him for surgery without a current CBC.  Recommend you check with his PCP Dr.Stoneking."   Sent to MR to be faxed and scanned into epic.

## 2012-12-18 ENCOUNTER — Other Ambulatory Visit: Payer: Self-pay | Admitting: Oncology

## 2012-12-18 DIAGNOSIS — D693 Immune thrombocytopenic purpura: Secondary | ICD-10-CM

## 2012-12-19 ENCOUNTER — Telehealth: Payer: Self-pay | Admitting: Oncology

## 2012-12-19 NOTE — Telephone Encounter (Signed)
lvm for pt regarding to 10.1.14 lab appt.Marland Kitchen

## 2012-12-20 ENCOUNTER — Other Ambulatory Visit: Payer: Self-pay | Admitting: Physician Assistant

## 2012-12-20 ENCOUNTER — Encounter: Payer: Federal, State, Local not specified - PPO | Admitting: Internal Medicine

## 2012-12-20 ENCOUNTER — Other Ambulatory Visit: Payer: Federal, State, Local not specified - PPO

## 2012-12-21 ENCOUNTER — Ambulatory Visit (HOSPITAL_COMMUNITY)
Admission: RE | Admit: 2012-12-21 | Discharge: 2012-12-21 | Disposition: A | Discharge status: Home / Self Care | Payer: Federal, State, Local not specified - PPO | Source: Ambulatory Visit | Attending: Physician Assistant | Admitting: Physician Assistant

## 2012-12-21 ENCOUNTER — Ambulatory Visit (INDEPENDENT_AMBULATORY_CARE_PROVIDER_SITE_OTHER): Payer: Self-pay | Admitting: Internal Medicine

## 2012-12-21 ENCOUNTER — Encounter (HOSPITAL_COMMUNITY)
Admission: RE | Admit: 2012-12-21 | Discharge: 2012-12-21 | Disposition: A | Discharge status: Home / Self Care | Payer: Federal, State, Local not specified - PPO | Source: Ambulatory Visit | Attending: Internal Medicine | Admitting: Internal Medicine

## 2012-12-21 ENCOUNTER — Encounter (HOSPITAL_COMMUNITY): Payer: Self-pay

## 2012-12-21 ENCOUNTER — Encounter: Payer: Self-pay | Admitting: Internal Medicine

## 2012-12-21 VITALS — BP 140/68 | HR 84 | Ht 68.0 in | Wt 213.0 lb

## 2012-12-21 DIAGNOSIS — I10 Essential (primary) hypertension: Secondary | ICD-10-CM | POA: Insufficient documentation

## 2012-12-21 DIAGNOSIS — Z96659 Presence of unspecified artificial knee joint: Secondary | ICD-10-CM | POA: Insufficient documentation

## 2012-12-21 DIAGNOSIS — Z951 Presence of aortocoronary bypass graft: Secondary | ICD-10-CM

## 2012-12-21 DIAGNOSIS — D696 Thrombocytopenia, unspecified: Secondary | ICD-10-CM | POA: Insufficient documentation

## 2012-12-21 DIAGNOSIS — Z01818 Encounter for other preprocedural examination: Secondary | ICD-10-CM | POA: Insufficient documentation

## 2012-12-21 DIAGNOSIS — Z950 Presence of cardiac pacemaker: Secondary | ICD-10-CM | POA: Insufficient documentation

## 2012-12-21 DIAGNOSIS — Z95810 Presence of automatic (implantable) cardiac defibrillator: Secondary | ICD-10-CM

## 2012-12-21 DIAGNOSIS — I4891 Unspecified atrial fibrillation: Secondary | ICD-10-CM

## 2012-12-21 DIAGNOSIS — Z96649 Presence of unspecified artificial hip joint: Secondary | ICD-10-CM | POA: Insufficient documentation

## 2012-12-21 DIAGNOSIS — I2589 Other forms of chronic ischemic heart disease: Secondary | ICD-10-CM

## 2012-12-21 DIAGNOSIS — Z01812 Encounter for preprocedural laboratory examination: Secondary | ICD-10-CM | POA: Insufficient documentation

## 2012-12-21 LAB — ICD DEVICE OBSERVATION
BATTERY VOLTAGE: 3.1 V
BRDY-0002RV: 40 {beats}/min
CHARGE TIME: 9.609 s
DEV-0020ICD: NEGATIVE
FVT: 0
PACEART VT: 0
RV LEAD AMPLITUDE: 19.6365 mv
RV LEAD IMPEDENCE ICD: 352 Ohm
RV LEAD THRESHOLD: 0.5 V
TOT-0001: 2
TZAT-0001FASTVT: 1
TZAT-0001SLOWVT: 1
TZAT-0002SLOWVT: NEGATIVE
TZAT-0012FASTVT: 200 ms
TZAT-0018FASTVT: NEGATIVE
TZAT-0020SLOWVT: 1.5 ms
TZON-0003VSLOWVT: 350 ms
TZON-0005SLOWVT: 12
TZST-0001FASTVT: 4
TZST-0001FASTVT: 5
TZST-0001SLOWVT: 3
TZST-0001SLOWVT: 4
TZST-0001SLOWVT: 5
TZST-0002FASTVT: NEGATIVE
TZST-0002FASTVT: NEGATIVE
TZST-0002FASTVT: NEGATIVE
TZST-0002FASTVT: NEGATIVE
TZST-0002SLOWVT: NEGATIVE
TZST-0002SLOWVT: NEGATIVE
VENTRICULAR PACING ICD: 0.05 pct
VF: 0

## 2012-12-21 LAB — CBC WITH DIFFERENTIAL/PLATELET
Basophils Relative: 0 % (ref 0–1)
Eosinophils Absolute: 0.1 10*3/uL (ref 0.0–0.7)
HCT: 36.7 % — ABNORMAL LOW (ref 39.0–52.0)
Hemoglobin: 12.7 g/dL — ABNORMAL LOW (ref 13.0–17.0)
Lymphocytes Relative: 17 % (ref 12–46)
Lymphs Abs: 1.2 10*3/uL (ref 0.7–4.0)
MCH: 35.2 pg — ABNORMAL HIGH (ref 26.0–34.0)
MCHC: 34.6 g/dL (ref 30.0–36.0)
MCV: 101.7 fL — ABNORMAL HIGH (ref 78.0–100.0)
Monocytes Absolute: 0.7 10*3/uL (ref 0.1–1.0)
Monocytes Relative: 10 % (ref 3–12)
Neutro Abs: 4.9 10*3/uL (ref 1.7–7.7)
Neutrophils Relative %: 71 % (ref 43–77)
RBC: 3.61 MIL/uL — ABNORMAL LOW (ref 4.22–5.81)
WBC: 6.9 10*3/uL (ref 4.0–10.5)

## 2012-12-21 LAB — COMPREHENSIVE METABOLIC PANEL
ALT: 23 U/L (ref 0–53)
Albumin: 4.1 g/dL (ref 3.5–5.2)
Alkaline Phosphatase: 62 U/L (ref 39–117)
BUN: 25 mg/dL — ABNORMAL HIGH (ref 6–23)
CO2: 32 mEq/L (ref 19–32)
Chloride: 99 mEq/L (ref 96–112)
Potassium: 3.8 mEq/L (ref 3.5–5.1)
Total Bilirubin: 1 mg/dL (ref 0.3–1.2)
Total Protein: 8 g/dL (ref 6.0–8.3)

## 2012-12-21 LAB — URINALYSIS, ROUTINE W REFLEX MICROSCOPIC
Bilirubin Urine: NEGATIVE
Nitrite: NEGATIVE
Protein, ur: NEGATIVE mg/dL
Specific Gravity, Urine: 1.01 (ref 1.005–1.030)
pH: 7 (ref 5.0–8.0)

## 2012-12-21 LAB — APTT: aPTT: 33 seconds (ref 24–37)

## 2012-12-21 LAB — PROTIME-INR: Prothrombin Time: 18.3 seconds — ABNORMAL HIGH (ref 11.6–15.2)

## 2012-12-21 MED ORDER — CHLORHEXIDINE GLUCONATE 4 % EX LIQD: 60.0000 mL | Freq: Once | CUTANEOUS | Status: DC

## 2012-12-21 MED ORDER — SODIUM CHLORIDE 0.9 % IV SOLN: INTRAVENOUS | Status: DC

## 2012-12-21 NOTE — Assessment & Plan Note (Signed)
His device is working normally, and his incision has healed nicely.

## 2012-12-21 NOTE — Assessment & Plan Note (Signed)
He denies anginal symptoms. He would be considered low risk for hip surgery from a cardiovascular perspective.

## 2012-12-21 NOTE — Progress Notes (Signed)
HPI Mr. Enriques returns today for followup. He is a very pleasant 79 year old man with an ischemic cm, and chronic class II systolic heart failure, chronic atrial fibrillation, chronic left ventricular dysfunction, ejection fraction 25%, status post ICD implantation. The patient is pending hip replacement surgery. He denies chest pain, shortness of breath, peripheral edema, syncope, or recent ICD shock.  No Known Allergies   Current Outpatient Prescriptions  Medication Sig Dispense Refill  . allopurinol (ZYLOPRIM) 300 MG tablet Take 1 tablet by mouth daily.      Marland Kitchen apixaban (ELIQUIS) 5 MG TABS tablet Take 5 mg by mouth 2 (two) times daily.      . colchicine 0.6 MG tablet Take 0.6 mg by mouth daily.        . ferrous sulfate 325 (65 FE) MG tablet Take 325 mg by mouth daily with breakfast.      . furosemide (LASIX) 40 MG tablet Take 40 mg by mouth 2 (two) times daily.        . metoprolol (LOPRESSOR) 50 MG tablet Take 50 mg by mouth 2 (two) times daily.        . multivitamin (THERAGRAN) per tablet Take 1 tablet by mouth daily.        . potassium chloride SA (K-DUR,KLOR-CON) 20 MEQ tablet Take 20 mEq by mouth daily.        . rosuvastatin (CRESTOR) 20 MG tablet Take 20 mg by mouth daily.        Marland Kitchen terazosin (HYTRIN) 5 MG capsule Take 5 mg by mouth at bedtime.        . valsartan (DIOVAN) 80 MG tablet Take 80 mg by mouth daily.      . vitamin C (ASCORBIC ACID) 500 MG tablet Take 500 mg by mouth daily.         No current facility-administered medications for this visit.   Facility-Administered Medications Ordered in Other Visits  Medication Dose Route Frequency Provider Last Rate Last Dose  . 0.9 %  sodium chloride infusion   Intravenous Continuous Joshua Chadwell, PA-C      . chlorhexidine (HIBICLENS) 4 % liquid 4 application  60 mL Topical Once [employer], PA-C      . chlorhexidine (HIBICLENS) 4 % liquid 4 application  60 mL Topical Once Margart Sickles, PA-C         Past Medical  History  Diagnosis Date  . Gout   . Hypertension   . Osteoarthritis   . CHF (congestive heart failure)   . Cardiomyopathy     ischemic  . Coronary artery disease   . Heart attack   . 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 Arrhythmia     afib     dt tilly  . Automatic implantable cardioverter-defibrillator in situ     dr Ladona Ridgel    ROS:   All systems reviewed and negative except as noted in the HPI.   Past Surgical History  Procedure Laterality Date  . Mastectomy, partial      left breast 1991  . Arthroplasty    . Anginoplasty      1995  . Icd    . Insert / replace / remove pacemaker      icd  . Coronary artery bypass graft      triple bypass in June 1985  . Eye surgery    . Joint replacement      bil  . Hip arthroplasty    . Cardiac catheterization  x4     Family History  Problem Relation Age of Onset  . Heart attack Father      History   Social History  . Marital Status: Married    Spouse Name: N/A    Number of Children: N/A  . Years of Education: N/A   Occupational History  . Not on file.   Social History Main Topics  . Smoking status: Former Smoker -- 2.00 packs/day for 10 years    Types: Cigarettes    Quit date: 03/22/1964  . Smokeless tobacco: Not on file  . Alcohol Use: Yes  . Drug Use: Not on file  . Sexual Activity: Not on file   Other Topics Concern  . Not on file   Social History Narrative  . No narrative on file     BP 140/68  Pulse 84  Ht 5\' 8"  (1.727 m)  Wt 213 lb (96.616 kg)  BMI 32.39 kg/m2  Physical Exam:  Well appearing elderly man, NAD HEENT: Unremarkable Neck:  6 cm JVD, no thyromegally Back:  No CVA tenderness Lungs:  Clear with no wheezes, rales, or rhonchi. Well-healed ICD incision. HEART:  IRegular rate rhythm, no murmurs, no rubs, no clicks Abd:  soft, positive bowel sounds, no organomegally, no rebound, no guarding Ext:  2 plus pulses, no edema, no cyanosis, no clubbing Skin:  No rashes no  nodules Neuro:  CN II through XII intact, motor grossly intact  DEVICE  Normal device function.  See PaceArt for details.   Assess/Plan:

## 2012-12-21 NOTE — Assessment & Plan Note (Signed)
His ventricular rates are well controlled. No change in medical therapy.

## 2012-12-21 NOTE — Progress Notes (Signed)
Dr Arlyn Leak called for stress test and ekg. Also Form faxed to Dr Ladona Ridgel. medtronic rep notified

## 2012-12-22 ENCOUNTER — Other Ambulatory Visit (HOSPITAL_BASED_OUTPATIENT_CLINIC_OR_DEPARTMENT_OTHER): Payer: Federal, State, Local not specified - PPO

## 2012-12-22 DIAGNOSIS — D6959 Other secondary thrombocytopenia: Secondary | ICD-10-CM

## 2012-12-22 DIAGNOSIS — D693 Immune thrombocytopenic purpura: Secondary | ICD-10-CM

## 2012-12-22 LAB — CBC WITH DIFFERENTIAL/PLATELET
BASO%: 0.6 % (ref 0.0–2.0)
HCT: 36.2 % — ABNORMAL LOW (ref 38.4–49.9)
HGB: 12.2 g/dL — ABNORMAL LOW (ref 13.0–17.1)
MCHC: 33.7 g/dL (ref 32.0–36.0)
MONO#: 0.8 10*3/uL (ref 0.1–0.9)
NEUT%: 70 % (ref 39.0–75.0)
RBC: 3.55 10*6/uL — ABNORMAL LOW (ref 4.20–5.82)
WBC: 6.8 10*3/uL (ref 4.0–10.3)
lymph#: 1.1 10*3/uL (ref 0.9–3.3)

## 2012-12-22 LAB — MORPHOLOGY: PLT EST: DECREASED

## 2012-12-23 LAB — URINE CULTURE
Colony Count: NO GROWTH
Culture: NO GROWTH

## 2012-12-25 NOTE — Progress Notes (Signed)
Anesthesia Chart Review:  Patient is a 79 year old male scheduled for right THA on 12/29/12 by Dr. [name].  History includes former smoker, CAD/MI s/p CABG (LIMA to LAD '85 in [place], [place]) and PTCA RCA '95, ischemic cardiomyopathy, chronic systolic CHF, s/p Medtronic ICD 11/22/07, afib s/p cardioversion '09, HTN, gout, thrombocytopenia, osteoarthritis, left breast cancer s/p partial mastectomy and chemotherapy '91, left THA '06, left TKA '03, right TKA. PCP is listed as Dr. Merlene Laughter. He was evaluated for chronic thrombocytopenia (diagnosed following chemotherapy) by Dr. Cyndie Chime in January 2013.  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  Patient also reported moderate ETOH use at that time.  Continued observation and PRN HEM-ONC follow-up recommended at that time.  Primary cardiologist is Dr. Donnie Aho who cleared patient for this procedure following a non-ischemic stress test.  EP cardiologist is Dr. Pollyann Kennedy visit 12/21/12 and felt patient would be considered low risk for hips surgery from a CV perspective. ICD device was working normally.  Nuclear stress test on 10/12/12 showed evidence of a previous anteroapical myocardial infarction, but no evidence of ischemia, EF 32% with anteroseptal, apical and distal anterior hypokinesis.  Echo on 10/06/11 showed concentric LVH with global hypokinesis, EF 25%, akinesis of the anterior wall, marked La enlargement, mild MR/TR/AR.  Cardiac cath on 09/28/07 showed: 1. Interval reduction in left ventricular ejection fraction now at 20-25%. 2. Coronary artery disease with patent internal mammary graft to left anterior descending and occlusion of native the left anterior descending. 3. Mild-to-moderate disease involving the right coronary, minimal disease involving circumflex.   CXR on 12/21/12 showed no active cardiopulmonary disease, no significant  change.  Preoperative labs noted.  Cr 1.57 (down from 1.69 on 03/31/11). PLT count 88K (previously 86-109K since 03/31/11. H/H 12.2/36.2. PT/INR 18.3/1.57, PTT 33. He is not on Coumadin therapy, so I will not plan to recheck PT/INR since his INR is < 1.6.  Urine culture showed no growth.  T&S done. (I spoke with Tresa Endo at Dr. Candise Bowens office.  She states Dr. Candise Bowens PA Sharia Reeve is aware of thrombocytopenia.  As above, it is not considered new.  They have received medical and cardiology clearance.)  Thrombocytopenia appears stable.  According to [facility] notes it is not felt to be autoimmune related but due to previous chemotherapy.  PLT count > 50K, so will defer decision for PLT transfusion to Dr. [name].  Cardiology clearances from Dr. Donnie Aho and Dr. Ladona Ridgel noted.  If no acute changes then anticipate that he can proceed as planned.   Velna Ochs [facility] Short Stay Center/Anesthesiology Phone [phone] 12/25/2012 3:04 PM

## 2012-12-26 ENCOUNTER — Encounter: Payer: Self-pay | Admitting: Cardiology

## 2012-12-26 NOTE — Progress Notes (Signed)
Patient ID: Martin [name], male   DOB: 12/19/1930, 79 y.o.   MRN: [id number]   ONE DAY REGADENOSON MYOVIEW  DATE: 10/12/12  PATIENT: Martin [name] (17-Oct-1930)  REFERRING:  Dr. Merlene Laughter  INDICATIONS: Coronary artery disease,  preoperative cardiac evaluation  PROCEDURE: The patient received a resting intravenous injection of 10.9 millicuries of Tc 4m tetrafosmin with SPECT acquisition done after an adequate delay.  He then received an intravenous injection of 0.4 mg of regadenoson over 10 seconds followed after 20 seconds by a repeat intravenous injection of 31.8 millicuries of Tc 46m tetrafosmin with SPECT acquisition done after an adequate delay.  With regadenoson injection the patient had no symptoms.  He tolerated the procedure well. Resting HR 84 bpm.  Peak HR 110 bpm.  Resting BP 110/70.   Peak BP 110/70.  EKG DATA:  12 lead EKG shows atrial fibrillation and a previous anteroseptal infarction. The EKG is unchanged during lexiscan infusion.  SCINTIGRAPHIC DATA:  The quality of the study is good.  Review of data in cine format shows no Mild motion with motion correction applied.  No significant soft tissue attenuation noted.  TID ratio is 1.06.  EDV = 181 cc. ESV = 121 cc.  Perfusion imaging shows a moderate to large moderate intensity fixed defect in the anteroseptal, distal anterior and apical regions consistent with previous infarction but no evidence of ischemia seen in the other distribution.  Quantitative gated SPECT analysis shows a dilated left ventricle with an ejection fraction of 32% with anteroseptal, apical and distal anterior hypokinesis  IMPRESSION:  1. Abnormal Lexiscan Myoview scan with evidence of a previous anteroapical myocardial infarction but no evidence of ischemia 2. Abnormal quantitative gated SPECT ejection fraction of 32% with anteroseptal, apical and distal anterior hypokinesis.  Recommendations: The patient has no evidence of ischemia at this time.  May proceed  with orthopedic surgery from a cardiovascular viewpoint.  Darden Palmer. MD [facility]

## 2012-12-26 NOTE — Progress Notes (Signed)
Patient ID: Martin [name], male   DOB: Jan 26, 1931, 79 y.o.   MRN: [id number]   [name], Martin  Date of visit:  10/05/2012 DOB:  03-22-1931    Age:  79 yrs. Medical record number:  [id number]     Account number:  [id number] Primary Care Provider: Pete Glatter, HAL T ____________________________ CURRENT DIAGNOSES  1. CAD,Native  2. Congestive Heart Failure Left  3. Atrial fibrillation  4. Hypertension-Essential (Benign)  5. Long Term Use Anticoagulant  6. AICD in situ  7. Obesity(BMI30-40)  8. Hyperlipidemia  9. Gout Unspecified  10. Thrombocytopenia Unspecified  11. Retinal Artery branch occlusion  12. MI-S/P Anterior  13. Surgery-Aortocoronary Bypass Grafting  14. Personal History Of Malignant Neoplasm Of Breast ____________________________ ALLERGIES  heparin, Intolerance-unknown  Tikosyn, Intolerance-unknown ____________________________ MEDICATIONS  1. Vitamin C 500 mg tablet, Daily  2. Nitroglycerin 0.4 mg Tablet, Sublingual, PRN  3. Multivitamins Tab, 1 p.o. daily  4. Crestor 20 mg tablet, 1 tab po qd  5. allopurinol 300 mg tablet, 1 p.o. q.d.  6. Colcrys 0.6 mg tablet, 1 p.o. daily  7. Klor-Con M20 20 mEq tablet,ER particles/crystals, 2qd  8. terazosin 5 mg capsule, QHS  9. Diovan 160 mg tablet, 1/2 tab daily  10. furosemide 40 mg tablet, BID  11. metoprolol tartrate 50 mg tablet, BID  12. Eliquis 5 mg tablet, BID  13. ferrous sulfate 325 mg (65 mg iron) tablet, 1 p.o. daily ____________________________ CHIEF COMPLAINTS  Followup of Atrial fibrillation  Followup of CAD,Native ____________________________ HISTORY OF PRESENT ILLNESS  Patient seen for cardiac followup and for preoperative cardiac evaluation. He is having a lot of trouble with his right hip and wants to know if he can have surgery on this hip. He will be 79 on his next birthday. He has had no bleeding complications on Eliquus. He denies angina and has no PND, orthopnea or edema. He has no significant  claudication. He has not had a preoperative assessment in some time and is relatively inactive now although he does try to get some water exercise. He has had no defibrillator discharges ____________________________ PAST HISTORY  Past Medical Illnesses:  hyperlipidemia, obesity, carcinoma-breast, hypertension, gout, osteoarthritis, BPH, thrombocytopenia, history of carpal tunnel syndrome, retinal branch occlusion October 2012;  Cardiovascular Illnesses:  CAD, S/P MI-anterior 1985, atrial fibrillation with failed cardioversion x 2 and failure on Tikosyn and amiodarone;  Surgical Procedures:  CABG w LIMA to LAD 1985 in [place], knee replacement-rt, breast mastectomy left, tonsillectomy/adenoids, knee replacement-left, hip replacement-left December 2006;  NYHA Classification:  II;  Cardiology Procedures-Invasive:  PTCA of the RCA December 1995, cardioversion January and April 2009, cardiac cath (left) July 2009, Medtronic AICD implant July 2009;  Cardiology Procedures-Noninvasive:  adenosine cardiolite November 2006, echocardiogram February 2011, echocardiogram July 2013;  Cardiac Cath Results:  normal Left main, 90% stenosis proximal Diag 1, occluded LAD, no significant disease CFX, 90% stenosis proximal RCA, 90% stenosis distal RCA;  LVEF of 25% documented via echocardiogram on 10/06/2011,  CHADS Score:  2,  CHA2DS2-VASC Score:  4 ____________________________ CARDIO-PULMONARY TEST DATES EKG Date:  10/05/2012;   Cardiac Cath Date:  09/28/2007;  CABG: 03/23/1983;  Stent Placement Date: 03/16/1994;  Holter/Event Monitor Date: 10/04/2007;  Nuclear Study Date:  02/03/2005;  Echocardiography Date: 10/06/2011;  Chest Xray Date: 07/24/2007;   ____________________________ FAMILY HISTORY Brother -- Hypertension Father -- Congestive heart failure, Deceased Mother -- CVA, Deceased ____________________________ SOCIAL HISTORY Alcohol Use:  drinks regularly 1-2 per day;  Smoking:  used to smoke but  quit Prior to  1980;  Diet:  regular diet without modifications;  Lifestyle:  married;  Exercise:  mowing the yard;  Occupation:  IRS and retired;  Residence:  lives with wife;   ____________________________ REVIEW OF SYSTEMS General:  denies recent weight change, fatique or change in exercise tolerance. Eyes: wears eye glasses/contact lenses, retinal branch occlusion Respiratory: denies dyspnea, cough, wheezing or hemoptysis. Cardiovascular:  please review HPI Abdominal: intermittent dyspepsia Genitourinary-Male: hesitancy, nocturia  Musculoskeletal:  arthritis of the toes Hematological/Immunologic:  thrombocytopenia ____________________________ PHYSICAL EXAMINATION VITAL SIGNS  Blood Pressure:  118/70 Sitting, Right arm, regular cuff   Pulse:  78/min. Weight:  212.00 lbs. Height:  70"BMI: 30  Constitutional:  pleasant white male in no acute distress Skin:  warm and dry to touch, no apparent skin lesions, or masses noted. Head:  normocephalic, balding male hair pattern ENT:  ears, nose and throat reveal no gross abnormalities.  Dentition good. Neck:  supple, no masses, thyromegaly, JVD. Carotid pulses are full and equal bilaterally without bruits. Chest:  clear to auscultation and percussion, findings consistent with left mastectomy, healed median sternotomy scar, healed ICD incision in the left pectoral area Cardiac:  irregular rhythm, normal S1 and S2, no S3 or S4, no murmurs,clicks, or rub heard Peripheral Pulses:  the femoral,dorsalis pedis, and posterior tibial pulses are full and equal bilaterally with no bruits auscultated. Extremities & Back:  no edema present Neurological:  no gross motor or sensory deficits noted, affect appropriate, oriented x3. ____________________________ MOST RECENT LIPID PANEL 02/02/12  CHOL TOTL 110 mg/dl, LDL 55 calc, HDL 45 mg/dl and CHOL/HDL 2.5 (Calc) ____________________________ IMPRESSIONS/PLAN  1. Chronic atrial fibrillation 2. Functioning implantable  defibrillator 3. Chronic systolic heart failure 4. Long-term use of anticoagulation 5. Coronary artery disease with previous bypass grafting and previous PTCA of the right coronary artery with patent sites in 2009  Recommendations:  It is difficult to assess his physical capability and he does have diminished ejection fraction. Recommended a Lexiscan Myoview study to help risk stratify for his intermediate risk surgery.  If this is low risk for ischemia, he could go ahead and have the hip replacement on the right. He does have some chronic medical issues of thrombocytopenia. In addition I asked him to stop taking aspirin at the present time. ____________________________ TODAYS ORDERS  1. Return Visit: 6 months  2. Lexiscan 1 day: At Patient Convenience  3. 12 Lead EKG: Today                       ____________________________ Cardiology Physician:  Darden Palmer MD [facility]

## 2012-12-27 ENCOUNTER — Encounter: Payer: Self-pay | Admitting: Internal Medicine

## 2012-12-28 ENCOUNTER — Other Ambulatory Visit: Payer: Self-pay | Admitting: Physician Assistant

## 2012-12-28 MED ORDER — CEFAZOLIN SODIUM-DEXTROSE 2-3 GM-% IV SOLR
2.0000 g | INTRAVENOUS | Status: AC
  Administered 2012-12-29: 2 g via INTRAVENOUS
  Filled 2012-12-28: qty 50

## 2012-12-28 NOTE — H&P (Addendum)
TOTAL HIP ADMISSION H&P  Patient is admitted for right total hip arthroplasty.  Subjective:  Chief Complaint: right hip pain  HPI: Martin [name], 79 y.o. male, has a history of pain and functional disability in the right hip(s) due to arthritis and patient has failed non-surgical conservative treatments for greater than 12 weeks to include NSAID's and/or analgesics, corticosteriod injections and activity modification.  Onset of symptoms was gradual starting 5 years ago with gradually worsening course since that time.The patient noted no past surgery on the right hip(s).  Patient currently rates pain in the right hip at 8 out of 10 with activity. Patient has night pain, worsening of pain with activity and weight bearing, pain that interfers with activities of daily living and pain with passive range of motion. Patient has evidence of periarticular osteophytes and joint space narrowing by imaging studies. This condition presents safety issues increasing the risk of falls.  There is no current active infection.  Patient Active Problem List   Diagnosis Date Noted  . 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 ICD-Medtronic 11/25/2008  . GOUT 11/21/2008  . HYPERTENSION 11/21/2008  . MYOCARDIAL INFARCTION, HX OF 11/21/2008  . CARDIOMYOPATHY, ISCHEMIC 11/21/2008  . ATRIAL FIBRILLATION, CHRONIC 11/21/2008  . CHRONIC SYSTOLIC HEART FAILURE 11/21/2008  . OSTEOARTHRITIS 11/21/2008  . CORONARY ARTERY BYPASS GRAFT, HX OF 11/21/2008   Past Medical History  Diagnosis Date  . Gout   . Hypertension   . Osteoarthritis   . CHF (congestive heart failure)   . Cardiomyopathy     ischemic  . Coronary artery disease   . Heart attack   . 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 Arrhythmia     afib     dt tilly  . Automatic implantable cardioverter-defibrillator in situ     dr Ladona Ridgel    Past Surgical History  Procedure Laterality Date  . Mastectomy, partial      left breast 1991  . Arthroplasty    .  Anginoplasty      1995  . Icd    . Insert / replace / remove pacemaker      icd  . Coronary artery bypass graft      triple bypass in June 1985  . Eye surgery    . Joint replacement      bil  . Hip arthroplasty    . Cardiac catheterization      x4     (Not in a hospital admission) No Known Allergies  History  Substance Use Topics  . Smoking status: Former Smoker -- 2.00 packs/day for 10 years    Types: Cigarettes    Quit date: 03/22/1964  . Smokeless tobacco: Not on file  . Alcohol Use: Yes    Family History  Problem Relation Age of Onset  . Heart attack Father      Review of Systems  Constitutional: Negative.   HENT: Positive for hearing loss. Negative for congestion, ear discharge, ear pain, nosebleeds, sore throat and tinnitus.   Eyes: Negative.   Respiratory: Negative.  Negative for stridor.   Cardiovascular: Positive for chest pain. Negative for palpitations, orthopnea, claudication, leg swelling and PND.  Gastrointestinal: Negative.   Genitourinary: Positive for frequency. Negative for dysuria, urgency, hematuria and flank pain.  Musculoskeletal: Positive for joint pain. Negative for falls.  Skin: Negative.   Neurological: Negative.  Negative for headaches.  Endo/Heme/Allergies: Bruises/bleeds easily.  Psychiatric/Behavioral: Negative.     Objective:  Physical Exam  Constitutional: He is oriented to person, place, and  time. He appears well-developed and well-nourished. No distress.  HENT:  Head: Normocephalic and atraumatic.  Nose: Nose normal.  Eyes: Conjunctivae and EOM are normal. Pupils are equal, round, and reactive to light.  Neck: Normal range of motion. Neck supple.  Cardiovascular: Normal rate and intact distal pulses.  An irregularly irregular rhythm present.  Respiratory: Effort normal and breath sounds normal. No respiratory distress. He has no wheezes. He exhibits no tenderness.  GI: Soft. Bowel sounds are normal. He exhibits no distension.  There is no tenderness.  Musculoskeletal:       Right hip: He exhibits decreased range of motion, decreased strength and bony tenderness.  Lymphadenopathy:    He has no cervical adenopathy.  Neurological: He is alert and oriented to person, place, and time. No cranial nerve deficit.  Skin: Skin is warm and dry. No rash noted. No erythema.  Psychiatric: He has a normal mood and affect. His behavior is normal.    Vital signs in last 24 hours: @VSRANGES @  Labs:   Estimated body mass index is 32.39 kg/(m^2) as calculated from the following:   Height as of 12/21/12: 5\' 8"  (1.727 m).   Weight as of 12/21/12: 96.616 kg (213 lb).   Imaging Review Plain radiographs demonstrate severe degenerative joint disease of the right hip(s). The bone quality appears to be good for age and reported activity level.  Assessment/Plan:  End stage arthritis, right hip(s)  The patient history, physical examination, clinical judgement of the provider and imaging studies are consistent with end stage degenerative joint disease of the right hip(s) and total hip arthroplasty is deemed medically necessary. The treatment options including medical management, injection therapy, arthroscopy and arthroplasty were discussed at length. The risks and benefits of total hip arthroplasty were presented and reviewed. The risks due to aseptic loosening, infection, stiffness, dislocation/subluxation,  thromboembolic complications and other imponderables were discussed.  The patient acknowledged the explanation, agreed to proceed with the plan and consent was signed. Patient is being admitted for inpatient treatment for surgery, pain control, PT, OT, prophylactic antibiotics, VTE prophylaxis, progressive ambulation and ADL's and discharge planning.The patient is planning to be discharged home with home health services

## 2012-12-29 ENCOUNTER — Ambulatory Visit (HOSPITAL_COMMUNITY): Payer: Medicare Other

## 2012-12-29 ENCOUNTER — Inpatient Hospital Stay (HOSPITAL_COMMUNITY)
Admission: RE | Admit: 2012-12-29 | Discharge: 2013-01-05 | DRG: 469 | Disposition: A | Discharge status: Home Health | Payer: Medicare Other | Source: Ambulatory Visit | Attending: Orthopedic Surgery | Admitting: Orthopedic Surgery

## 2012-12-29 ENCOUNTER — Inpatient Hospital Stay (HOSPITAL_COMMUNITY): Payer: Medicare Other

## 2012-12-29 ENCOUNTER — Encounter (HOSPITAL_COMMUNITY): Payer: Self-pay | Admitting: *Deleted

## 2012-12-29 ENCOUNTER — Encounter (HOSPITAL_COMMUNITY): Payer: Medicare Other | Admitting: Vascular Surgery

## 2012-12-29 ENCOUNTER — Ambulatory Visit (HOSPITAL_COMMUNITY): Payer: Medicare Other | Admitting: Anesthesiology

## 2012-12-29 ENCOUNTER — Encounter (HOSPITAL_COMMUNITY)
Admission: RE | Disposition: A | Discharge status: Home Health | Payer: Self-pay | Source: Ambulatory Visit | Attending: Orthopedic Surgery

## 2012-12-29 DIAGNOSIS — M169 Osteoarthritis of hip, unspecified: Principal | ICD-10-CM | POA: Diagnosis present

## 2012-12-29 DIAGNOSIS — I5022 Chronic systolic (congestive) heart failure: Secondary | ICD-10-CM | POA: Diagnosis present

## 2012-12-29 DIAGNOSIS — D62 Acute posthemorrhagic anemia: Secondary | ICD-10-CM | POA: Diagnosis not present

## 2012-12-29 DIAGNOSIS — R112 Nausea with vomiting, unspecified: Secondary | ICD-10-CM | POA: Diagnosis not present

## 2012-12-29 DIAGNOSIS — E785 Hyperlipidemia, unspecified: Secondary | ICD-10-CM | POA: Diagnosis present

## 2012-12-29 DIAGNOSIS — Z7901 Long term (current) use of anticoagulants: Secondary | ICD-10-CM

## 2012-12-29 DIAGNOSIS — M1610 Unilateral primary osteoarthritis, unspecified hip: Principal | ICD-10-CM | POA: Diagnosis present

## 2012-12-29 DIAGNOSIS — D539 Nutritional anemia, unspecified: Secondary | ICD-10-CM | POA: Diagnosis present

## 2012-12-29 DIAGNOSIS — D759 Disease of blood and blood-forming organs, unspecified: Secondary | ICD-10-CM | POA: Diagnosis present

## 2012-12-29 DIAGNOSIS — N183 Chronic kidney disease, stage 3 unspecified: Secondary | ICD-10-CM | POA: Diagnosis present

## 2012-12-29 DIAGNOSIS — I2510 Atherosclerotic heart disease of native coronary artery without angina pectoris: Secondary | ICD-10-CM | POA: Diagnosis present

## 2012-12-29 DIAGNOSIS — E861 Hypovolemia: Secondary | ICD-10-CM | POA: Diagnosis not present

## 2012-12-29 DIAGNOSIS — Z95810 Presence of automatic (implantable) cardiac defibrillator: Secondary | ICD-10-CM

## 2012-12-29 DIAGNOSIS — Z951 Presence of aortocoronary bypass graft: Secondary | ICD-10-CM

## 2012-12-29 DIAGNOSIS — I129 Hypertensive chronic kidney disease with stage 1 through stage 4 chronic kidney disease, or unspecified chronic kidney disease: Secondary | ICD-10-CM | POA: Diagnosis present

## 2012-12-29 DIAGNOSIS — I959 Hypotension, unspecified: Secondary | ICD-10-CM

## 2012-12-29 DIAGNOSIS — I4510 Unspecified right bundle-branch block: Secondary | ICD-10-CM | POA: Diagnosis not present

## 2012-12-29 DIAGNOSIS — I5023 Acute on chronic systolic (congestive) heart failure: Secondary | ICD-10-CM | POA: Diagnosis present

## 2012-12-29 DIAGNOSIS — Z9221 Personal history of antineoplastic chemotherapy: Secondary | ICD-10-CM

## 2012-12-29 DIAGNOSIS — I252 Old myocardial infarction: Secondary | ICD-10-CM

## 2012-12-29 DIAGNOSIS — D469 Myelodysplastic syndrome, unspecified: Secondary | ICD-10-CM | POA: Diagnosis present

## 2012-12-29 DIAGNOSIS — R000 Tachycardia, unspecified: Secondary | ICD-10-CM | POA: Diagnosis not present

## 2012-12-29 DIAGNOSIS — N179 Acute kidney failure, unspecified: Secondary | ICD-10-CM | POA: Diagnosis not present

## 2012-12-29 DIAGNOSIS — I2589 Other forms of chronic ischemic heart disease: Secondary | ICD-10-CM | POA: Diagnosis present

## 2012-12-29 DIAGNOSIS — D696 Thrombocytopenia, unspecified: Secondary | ICD-10-CM | POA: Diagnosis present

## 2012-12-29 DIAGNOSIS — I9589 Other hypotension: Secondary | ICD-10-CM | POA: Diagnosis not present

## 2012-12-29 DIAGNOSIS — R579 Shock, unspecified: Secondary | ICD-10-CM | POA: Diagnosis not present

## 2012-12-29 DIAGNOSIS — I4891 Unspecified atrial fibrillation: Secondary | ICD-10-CM | POA: Diagnosis present

## 2012-12-29 DIAGNOSIS — Z853 Personal history of malignant neoplasm of breast: Secondary | ICD-10-CM

## 2012-12-29 HISTORY — PX: TOTAL HIP ARTHROPLASTY: SHX124

## 2012-12-29 HISTORY — DX: Old myocardial infarction: I25.2

## 2012-12-29 HISTORY — DX: Presence of automatic (implantable) cardiac defibrillator: Z95.810

## 2012-12-29 HISTORY — DX: Unspecified atrial fibrillation: I48.91

## 2012-12-29 HISTORY — DX: Atherosclerotic heart disease of native coronary artery without angina pectoris: I25.10

## 2012-12-29 HISTORY — DX: Other forms of chronic ischemic heart disease: I25.89

## 2012-12-29 HISTORY — DX: Unspecified osteoarthritis, unspecified site: M19.90

## 2012-12-29 HISTORY — DX: Hyperlipidemia, unspecified: E78.5

## 2012-12-29 HISTORY — DX: Personal history of malignant neoplasm of breast: Z85.3

## 2012-12-29 LAB — BASIC METABOLIC PANEL
BUN: 26 mg/dL — ABNORMAL HIGH (ref 6–23)
CO2: 27 mEq/L (ref 19–32)
GFR calc non Af Amer: 38 mL/min — ABNORMAL LOW (ref >90)
Glucose, Bld: 142 mg/dL — ABNORMAL HIGH (ref 70–99)
Potassium: 3.7 mEq/L (ref 3.5–5.1)
Sodium: 140 mEq/L (ref 135–145)

## 2012-12-29 LAB — POCT I-STAT 7, (LYTES, BLD GAS, ICA,H+H)
Bicarbonate: 25.3 mEq/L — ABNORMAL HIGH (ref 20.0–24.0)
Calcium, Ion: 1.13 mmol/L (ref 1.13–1.30)
Hemoglobin: 9.2 g/dL — ABNORMAL LOW (ref 13.0–17.0)
O2 Saturation: 91 %
Potassium: 3.7 mEq/L (ref 3.5–5.1)
TCO2: 27 mmol/L (ref 0–100)

## 2012-12-29 LAB — POCT I-STAT 4, (NA,K, GLUC, HGB,HCT)
Glucose, Bld: 165 mg/dL — ABNORMAL HIGH (ref 70–99)
Glucose, Bld: 197 mg/dL — ABNORMAL HIGH (ref 70–99)
HCT: 35 % — ABNORMAL LOW (ref 39.0–52.0)
Hemoglobin: 11.6 g/dL — ABNORMAL LOW (ref 13.0–17.0)
Hemoglobin: 11.9 g/dL — ABNORMAL LOW (ref 13.0–17.0)
Hemoglobin: 11.9 g/dL — ABNORMAL LOW (ref 13.0–17.0)
Potassium: 3.7 mEq/L (ref 3.5–5.1)
Potassium: 3.7 mEq/L (ref 3.5–5.1)

## 2012-12-29 LAB — POCT I-STAT 3, ART BLOOD GAS (G3+)
Acid-Base Excess: 5 mmol/L — ABNORMAL HIGH (ref 0.0–2.0)
O2 Saturation: 100 %
O2 Saturation: 96 %
Patient temperature: 100.7
TCO2: 31 mmol/L (ref 0–100)
pCO2 arterial: 43.5 mmHg (ref 35.0–45.0)
pH, Arterial: 7.406 (ref 7.350–7.450)
pO2, Arterial: 88 mmHg (ref 80.0–100.0)

## 2012-12-29 LAB — CBC
HCT: 27.8 % — ABNORMAL LOW (ref 39.0–52.0)
Hemoglobin: 10 g/dL — ABNORMAL LOW (ref 13.0–17.0)
Hemoglobin: 9.3 g/dL — ABNORMAL LOW (ref 13.0–17.0)
MCH: 32.5 pg (ref 26.0–34.0)
MCH: 31.5 pg (ref 26.0–34.0)
MCV: 95.1 fL (ref 78.0–100.0)
MCV: 94.2 fL (ref 78.0–100.0)
Platelets: 84 10*3/uL — ABNORMAL LOW (ref 150–400)
RBC: 3.08 MIL/uL — ABNORMAL LOW (ref 4.22–5.81)
RBC: 2.95 MIL/uL — ABNORMAL LOW (ref 4.22–5.81)
RDW: 17.3 % — ABNORMAL HIGH (ref 11.5–15.5)
WBC: 11.3 10*3/uL — ABNORMAL HIGH (ref 4.0–10.5)
WBC: 13 10*3/uL — ABNORMAL HIGH (ref 4.0–10.5)

## 2012-12-29 LAB — MRSA PCR SCREENING: MRSA by PCR: NEGATIVE

## 2012-12-29 LAB — PREPARE RBC (CROSSMATCH)

## 2012-12-29 LAB — PROTIME-INR: INR: 1.42 (ref 0.00–1.49)

## 2012-12-29 SURGERY — ARTHROPLASTY, HIP, TOTAL,POSTERIOR APPROACH
Anesthesia: General | Site: Hip | Laterality: Right | Wound class: Clean

## 2012-12-29 MED ORDER — SODIUM CHLORIDE 0.9 % IV SOLN
INTRAVENOUS | Status: DC | PRN
  Administered 2012-12-29: 10:00:00 via INTRAVENOUS

## 2012-12-29 MED ORDER — ATORVASTATIN CALCIUM 40 MG PO TABS
40.0000 mg | ORAL_TABLET | Freq: Every day | ORAL | Status: DC
  Administered 2012-12-30 – 2013-01-04 (×6): 40 mg via ORAL
  Filled 2012-12-29 (×8): qty 1

## 2012-12-29 MED ORDER — NEOSTIGMINE METHYLSULFATE 1 MG/ML IJ SOLN
INTRAMUSCULAR | Status: DC | PRN
  Administered 2012-12-29: 5 mg via INTRAVENOUS

## 2012-12-29 MED ORDER — TERAZOSIN HCL 5 MG PO CAPS
5.0000 mg | ORAL_CAPSULE | Freq: Every day | ORAL | Status: DC
  Filled 2012-12-29: qty 1

## 2012-12-29 MED ORDER — FENTANYL CITRATE 0.05 MG/ML IJ SOLN
INTRAMUSCULAR | Status: AC
  Filled 2012-12-29: qty 2

## 2012-12-29 MED ORDER — MENTHOL 3 MG MT LOZG
1.0000 | LOZENGE | OROMUCOSAL | Status: DC | PRN
  Filled 2012-12-29: qty 9

## 2012-12-29 MED ORDER — BUPIVACAINE-EPINEPHRINE 0.5% -1:200000 IJ SOLN
INTRAMUSCULAR | Status: DC | PRN
  Administered 2012-12-29: 20 mL

## 2012-12-29 MED ORDER — OXYCODONE-ACETAMINOPHEN 5-325 MG PO TABS: ORAL_TABLET | ORAL | Status: DC

## 2012-12-29 MED ORDER — BISACODYL 10 MG RE SUPP
10.0000 mg | Freq: Every day | RECTAL | Status: DC | PRN
  Administered 2013-01-03: 10 mg via RECTAL
  Filled 2012-12-29 (×2): qty 1

## 2012-12-29 MED ORDER — SENNOSIDES-DOCUSATE SODIUM 8.6-50 MG PO TABS
1.0000 | ORAL_TABLET | Freq: Every evening | ORAL | Status: DC | PRN
  Administered 2013-01-02 – 2013-01-03 (×2): 1 via ORAL
  Filled 2012-12-29 (×2): qty 1

## 2012-12-29 MED ORDER — ALBUMIN HUMAN 5 % IV SOLN
12.5000 g | Freq: Once | INTRAVENOUS | Status: AC
  Administered 2012-12-29: 12.5 g via INTRAVENOUS

## 2012-12-29 MED ORDER — VITAMIN C 500 MG PO TABS
500.0000 mg | ORAL_TABLET | Freq: Every day | ORAL | Status: DC
  Administered 2013-01-01 – 2013-01-05 (×5): 500 mg via ORAL
  Filled 2012-12-29 (×7): qty 1

## 2012-12-29 MED ORDER — SODIUM CHLORIDE 0.9 % IV SOLN: INTRAVENOUS | Status: DC

## 2012-12-29 MED ORDER — ONDANSETRON HCL 4 MG PO TABS: 4.0000 mg | ORAL_TABLET | Freq: Four times a day (QID) | ORAL | Status: DC | PRN

## 2012-12-29 MED ORDER — ROCURONIUM BROMIDE 100 MG/10ML IV SOLN
INTRAVENOUS | Status: DC | PRN
  Administered 2012-12-29: 50 mg via INTRAVENOUS

## 2012-12-29 MED ORDER — PHENYLEPHRINE HCL 10 MG/ML IJ SOLN
30.0000 ug/min | INTRAVENOUS | Status: DC
  Administered 2012-12-29: 75 ug/min via INTRAVENOUS
  Administered 2012-12-29: 165 ug/min via INTRAVENOUS
  Administered 2012-12-30: 15 ug/min via INTRAVENOUS
  Administered 2012-12-30: 165 ug/min via INTRAVENOUS
  Filled 2012-12-29 (×6): qty 4

## 2012-12-29 MED ORDER — PHENYLEPHRINE HCL 10 MG/ML IJ SOLN
10.0000 mg | INTRAVENOUS | Status: DC | PRN
  Administered 2012-12-29: 50 ug/min via INTRAVENOUS

## 2012-12-29 MED ORDER — METOCLOPRAMIDE HCL 5 MG PO TABS
5.0000 mg | ORAL_TABLET | Freq: Three times a day (TID) | ORAL | Status: DC | PRN
  Filled 2012-12-29: qty 2

## 2012-12-29 MED ORDER — DOPAMINE-DEXTROSE 3.2-5 MG/ML-% IV SOLN
INTRAVENOUS | Status: AC
  Administered 2012-12-29: 3 ug/kg/min via INTRAVENOUS
  Filled 2012-12-29: qty 250

## 2012-12-29 MED ORDER — MEPERIDINE HCL 25 MG/ML IJ SOLN: 6.2500 mg | INTRAMUSCULAR | Status: DC | PRN

## 2012-12-29 MED ORDER — CEFAZOLIN SODIUM-DEXTROSE 2-3 GM-% IV SOLR
2.0000 g | Freq: Four times a day (QID) | INTRAVENOUS | Status: AC
  Administered 2012-12-29 (×2): 2 g via INTRAVENOUS
  Filled 2012-12-29 (×3): qty 50

## 2012-12-29 MED ORDER — LACTATED RINGERS IV SOLN
INTRAVENOUS | Status: DC | PRN
  Administered 2012-12-29 (×2): via INTRAVENOUS

## 2012-12-29 MED ORDER — ALBUMIN HUMAN 5 % IV SOLN
INTRAVENOUS | Status: AC
  Administered 2012-12-29: 12.5 g via INTRAVENOUS
  Filled 2012-12-29: qty 250

## 2012-12-29 MED ORDER — ONDANSETRON HCL 4 MG/2ML IJ SOLN
INTRAMUSCULAR | Status: AC
  Administered 2012-12-29: 4 mg via INTRAVENOUS
  Filled 2012-12-29: qty 2

## 2012-12-29 MED ORDER — MIDAZOLAM HCL 2 MG/2ML IJ SOLN: 0.5000 mg | Freq: Once | INTRAMUSCULAR | Status: DC | PRN

## 2012-12-29 MED ORDER — FENTANYL CITRATE 0.05 MG/ML IJ SOLN: 50.0000 ug | INTRAMUSCULAR | Status: DC | PRN

## 2012-12-29 MED ORDER — METOCLOPRAMIDE HCL 5 MG/ML IJ SOLN
5.0000 mg | Freq: Three times a day (TID) | INTRAMUSCULAR | Status: DC | PRN
  Filled 2012-12-29: qty 2

## 2012-12-29 MED ORDER — ONDANSETRON HCL 4 MG/2ML IJ SOLN
INTRAMUSCULAR | Status: DC | PRN
  Administered 2012-12-29: 4 mg via INTRAMUSCULAR

## 2012-12-29 MED ORDER — PHENOL 1.4 % MT LIQD: 1.0000 | OROMUCOSAL | Status: DC | PRN

## 2012-12-29 MED ORDER — ACETAMINOPHEN 325 MG PO TABS: 650.0000 mg | ORAL_TABLET | Freq: Four times a day (QID) | ORAL | Status: DC | PRN

## 2012-12-29 MED ORDER — METOPROLOL TARTRATE 50 MG PO TABS
50.0000 mg | ORAL_TABLET | Freq: Two times a day (BID) | ORAL | Status: DC
Start: 2012-12-29 — End: 2012-12-29
  Filled 2012-12-29: qty 1

## 2012-12-29 MED ORDER — OXYCODONE HCL 5 MG PO TABS: 5.0000 mg | ORAL_TABLET | ORAL | Status: DC | PRN

## 2012-12-29 MED ORDER — IRBESARTAN 75 MG PO TABS: 75.0000 mg | ORAL_TABLET | Freq: Every day | ORAL | Status: DC

## 2012-12-29 MED ORDER — HYDROMORPHONE HCL PF 1 MG/ML IJ SOLN: 0.5000 mg | INTRAMUSCULAR | Status: DC | PRN

## 2012-12-29 MED ORDER — FUROSEMIDE 10 MG/ML IJ SOLN
40.0000 mg | Freq: Once | INTRAMUSCULAR | Status: AC
  Administered 2012-12-29: 40 mg via INTRAVENOUS
  Filled 2012-12-29: qty 4

## 2012-12-29 MED ORDER — FENTANYL CITRATE 0.05 MG/ML IJ SOLN
25.0000 ug | INTRAMUSCULAR | Status: DC | PRN
  Administered 2012-12-29 – 2012-12-30 (×5): 25 ug via INTRAVENOUS
  Administered 2012-12-30: 50 ug via INTRAVENOUS
  Administered 2012-12-30 – 2012-12-31 (×6): 25 ug via INTRAVENOUS
  Filled 2012-12-29 (×6): qty 2

## 2012-12-29 MED ORDER — PHENYLEPHRINE HCL 10 MG/ML IJ SOLN
INTRAMUSCULAR | Status: DC | PRN
  Administered 2012-12-29: 80 ug via INTRAVENOUS
  Administered 2012-12-29: 40 ug via INTRAVENOUS
  Administered 2012-12-29: 80 ug via INTRAVENOUS
  Administered 2012-12-29: 40 ug via INTRAVENOUS
  Administered 2012-12-29 (×2): 120 ug via INTRAVENOUS
  Administered 2012-12-29: 80 ug via INTRAVENOUS
  Administered 2012-12-29: 120 ug via INTRAVENOUS
  Administered 2012-12-29: 80 ug via INTRAVENOUS
  Administered 2012-12-29 (×2): 40 ug via INTRAVENOUS

## 2012-12-29 MED ORDER — FERROUS SULFATE 325 (65 FE) MG PO TABS
325.0000 mg | ORAL_TABLET | Freq: Every day | ORAL | Status: DC
  Administered 2013-01-01 – 2013-01-05 (×5): 325 mg via ORAL
  Filled 2012-12-29 (×8): qty 1

## 2012-12-29 MED ORDER — VECURONIUM BROMIDE 10 MG IV SOLR
INTRAVENOUS | Status: DC | PRN
  Administered 2012-12-29 (×2): 1 mg via INTRAVENOUS

## 2012-12-29 MED ORDER — LIDOCAINE HCL (CARDIAC) 20 MG/ML IV SOLN
INTRAVENOUS | Status: DC | PRN
  Administered 2012-12-29: 50 mg via INTRAVENOUS

## 2012-12-29 MED ORDER — FENTANYL CITRATE 0.05 MG/ML IJ SOLN
INTRAMUSCULAR | Status: DC | PRN
  Administered 2012-12-29 (×3): 50 ug via INTRAVENOUS
  Administered 2012-12-29: 200 ug via INTRAVENOUS

## 2012-12-29 MED ORDER — GLYCOPYRROLATE 0.2 MG/ML IJ SOLN
INTRAMUSCULAR | Status: DC | PRN
  Administered 2012-12-29: 0.6 mg via INTRAVENOUS

## 2012-12-29 MED ORDER — DOCUSATE SODIUM 100 MG PO CAPS
100.0000 mg | ORAL_CAPSULE | Freq: Two times a day (BID) | ORAL | Status: DC
  Administered 2012-12-31 – 2013-01-05 (×11): 100 mg via ORAL
  Filled 2012-12-29 (×11): qty 1

## 2012-12-29 MED ORDER — FLEET ENEMA 7-19 GM/118ML RE ENEM: 1.0000 | ENEMA | Freq: Once | RECTAL | Status: AC | PRN

## 2012-12-29 MED ORDER — PROMETHAZINE HCL 25 MG/ML IJ SOLN: 6.2500 mg | INTRAMUSCULAR | Status: DC | PRN

## 2012-12-29 MED ORDER — ONDANSETRON HCL 4 MG/2ML IJ SOLN
4.0000 mg | Freq: Once | INTRAMUSCULAR | Status: AC
  Administered 2012-12-29: 4 mg via INTRAVENOUS

## 2012-12-29 MED ORDER — SODIUM CHLORIDE 0.9 % IR SOLN
Status: DC | PRN
  Administered 2012-12-29: 1000 mL

## 2012-12-29 MED ORDER — APIXABAN 5 MG PO TABS: 5.0000 mg | ORAL_TABLET | Freq: Two times a day (BID) | ORAL | Status: DC

## 2012-12-29 MED ORDER — VASOPRESSIN 20 UNIT/ML IJ SOLN
0.0300 [IU]/min | INTRAVENOUS | Status: DC
  Administered 2012-12-29: 0.03 [IU]/min via INTRAVENOUS
  Filled 2012-12-29: qty 2.5

## 2012-12-29 MED ORDER — ONDANSETRON HCL 4 MG/2ML IJ SOLN
4.0000 mg | Freq: Four times a day (QID) | INTRAMUSCULAR | Status: DC | PRN
  Administered 2012-12-29 – 2012-12-31 (×2): 4 mg via INTRAVENOUS
  Filled 2012-12-29 (×2): qty 2

## 2012-12-29 MED ORDER — HYDROMORPHONE HCL PF 1 MG/ML IJ SOLN
0.2500 mg | INTRAMUSCULAR | Status: DC | PRN
Start: 2012-12-29 — End: 2012-12-29

## 2012-12-29 MED ORDER — COLCHICINE 0.6 MG PO TABS: 0.6000 mg | ORAL_TABLET | Freq: Every day | ORAL | Status: DC

## 2012-12-29 MED ORDER — OXYCODONE HCL 5 MG PO TABS: 5.0000 mg | ORAL_TABLET | Freq: Once | ORAL | Status: DC | PRN

## 2012-12-29 MED ORDER — BUPIVACAINE-EPINEPHRINE (PF) 0.5% -1:200000 IJ SOLN
INTRAMUSCULAR | Status: AC
  Filled 2012-12-29: qty 10

## 2012-12-29 MED ORDER — POTASSIUM CHLORIDE CRYS ER 20 MEQ PO TBCR
20.0000 meq | EXTENDED_RELEASE_TABLET | Freq: Every day | ORAL | Status: DC
  Administered 2012-12-30 – 2013-01-05 (×7): 20 meq via ORAL
  Filled 2012-12-29 (×8): qty 1

## 2012-12-29 MED ORDER — DOPAMINE-DEXTROSE 3.2-5 MG/ML-% IV SOLN
2.0000 ug/kg/min | INTRAVENOUS | Status: DC
  Administered 2012-12-29: 3 ug/kg/min via INTRAVENOUS

## 2012-12-29 MED ORDER — SODIUM CHLORIDE 0.9 % IV BOLUS (SEPSIS)
500.0000 mL | Freq: Once | INTRAVENOUS | Status: AC
  Administered 2012-12-29: 500 mL via INTRAVENOUS

## 2012-12-29 MED ORDER — PHENYLEPHRINE HCL 10 MG/ML IJ SOLN
30.0000 ug/min | INTRAVENOUS | Status: DC
  Administered 2012-12-29: 10 ug/min via INTRAVENOUS
  Filled 2012-12-29: qty 1

## 2012-12-29 MED ORDER — OXYCODONE HCL 5 MG/5ML PO SOLN: 5.0000 mg | Freq: Once | ORAL | Status: DC | PRN

## 2012-12-29 MED ORDER — ACETAMINOPHEN 650 MG RE SUPP: 650.0000 mg | Freq: Four times a day (QID) | RECTAL | Status: DC | PRN

## 2012-12-29 MED ORDER — ADULT MULTIVITAMIN W/MINERALS CH
1.0000 | ORAL_TABLET | Freq: Every day | ORAL | Status: DC
  Administered 2013-01-01 – 2013-01-05 (×5): 1 via ORAL
  Filled 2012-12-29 (×7): qty 1

## 2012-12-29 MED ORDER — NOREPINEPHRINE BITARTRATE 1 MG/ML IJ SOLN: 2.0000 ug/min | INTRAVENOUS | Status: DC

## 2012-12-29 MED ORDER — ALLOPURINOL 300 MG PO TABS: 300.0000 mg | ORAL_TABLET | Freq: Every day | ORAL | Status: DC

## 2012-12-29 MED ORDER — ETOMIDATE 2 MG/ML IV SOLN
INTRAVENOUS | Status: DC | PRN
  Administered 2012-12-29: 20 mg via INTRAVENOUS

## 2012-12-29 MED ORDER — FENTANYL CITRATE 0.05 MG/ML IJ SOLN
25.0000 ug | INTRAMUSCULAR | Status: DC | PRN
  Administered 2012-12-29: 25 ug via INTRAVENOUS

## 2012-12-29 MED ORDER — FUROSEMIDE 40 MG PO TABS: 40.0000 mg | ORAL_TABLET | Freq: Two times a day (BID) | ORAL | Status: DC

## 2012-12-29 SURGICAL SUPPLY — 61 items
ACETAB CUP W/GRIPTION 54 (Plate) ×2 IMPLANT
BLADE SAW SAG 73X25 THK (BLADE) ×1
BLADE SAW SGTL 73X25 THK (BLADE) ×1 IMPLANT
BRUSH FEMORAL CANAL (MISCELLANEOUS) IMPLANT
CAPT HIP PF MOP ×1 IMPLANT
CLOTH BEACON ORANGE TIMEOUT ST (SAFETY) ×2 IMPLANT
COVER BACK TABLE 24X17X13 BIG (DRAPES) IMPLANT
COVER SURGICAL LIGHT HANDLE (MISCELLANEOUS) ×2 IMPLANT
CUP ACETAB W/GRIPTION 54 (Plate) IMPLANT
DECANTER SPIKE VIAL GLASS SM (MISCELLANEOUS) ×1 IMPLANT
DRAPE INCISE IOBAN 66X45 STRL (DRAPES) IMPLANT
DRAPE ORTHO SPLIT 77X108 STRL (DRAPES) ×4
DRAPE SURG ORHT 6 SPLT 77X108 (DRAPES) ×2 IMPLANT
DRAPE U-SHAPE 47X51 STRL (DRAPES) ×2 IMPLANT
DRSG ADAPTIC 3X8 NADH LF (GAUZE/BANDAGES/DRESSINGS) ×2 IMPLANT
DRSG PAD ABDOMINAL 8X10 ST (GAUZE/BANDAGES/DRESSINGS) ×3 IMPLANT
DURAPREP 26ML APPLICATOR (WOUND CARE) ×2 IMPLANT
ELECT CAUTERY BLADE 6.4 (BLADE) ×2 IMPLANT
ELECT REM PT RETURN 9FT ADLT (ELECTROSURGICAL) ×2
ELECTRODE REM PT RTRN 9FT ADLT (ELECTROSURGICAL) ×1 IMPLANT
EVACUATOR 1/8 PVC DRAIN (DRAIN) IMPLANT
FACESHIELD LNG OPTICON STERILE (SAFETY) ×4 IMPLANT
GLOVE BIOGEL PI IND STRL 8 (GLOVE) ×2 IMPLANT
GLOVE BIOGEL PI INDICATOR 8 (GLOVE) ×2
GLOVE ORTHO TXT STRL SZ7.5 (GLOVE) ×4 IMPLANT
GLOVE SURG ORTHO 8.0 STRL STRW (GLOVE) ×4 IMPLANT
GOWN PREVENTION PLUS XLARGE (GOWN DISPOSABLE) ×4 IMPLANT
GOWN PREVENTION PLUS XXLARGE (GOWN DISPOSABLE) ×2 IMPLANT
GOWN STRL NON-REIN LRG LVL3 (GOWN DISPOSABLE) ×2 IMPLANT
HANDPIECE INTERPULSE COAX TIP (DISPOSABLE)
HOOD PEEL AWAY FACE SHEILD DIS (HOOD) ×2 IMPLANT
IMMOBILIZER KNEE 20 (SOFTGOODS)
IMMOBILIZER KNEE 20 THIGH 36 (SOFTGOODS) IMPLANT
IMMOBILIZER KNEE 22 UNIV (SOFTGOODS) ×1 IMPLANT
IMMOBILIZER KNEE 24 THIGH 36 (MISCELLANEOUS) IMPLANT
IMMOBILIZER KNEE 24 UNIV (MISCELLANEOUS)
KIT BASIN OR (CUSTOM PROCEDURE TRAY) ×2 IMPLANT
KIT ROOM TURNOVER OR (KITS) ×2 IMPLANT
MANIFOLD NEPTUNE II (INSTRUMENTS) ×2 IMPLANT
NDL MAYO TROCAR (NEEDLE) ×1 IMPLANT
NEEDLE 22X1 1/2 (OR ONLY) (NEEDLE) ×3 IMPLANT
NEEDLE MAYO TROCAR (NEEDLE) ×2 IMPLANT
NS IRRIG 1000ML POUR BTL (IV SOLUTION) ×2 IMPLANT
PACK TOTAL JOINT (CUSTOM PROCEDURE TRAY) ×2 IMPLANT
PAD ARMBOARD 7.5X6 YLW CONV (MISCELLANEOUS) ×4 IMPLANT
PRESSURIZER FEMORAL UNIV (MISCELLANEOUS) IMPLANT
SET HNDPC FAN SPRY TIP SCT (DISPOSABLE) IMPLANT
SPONGE GAUZE 4X4 12PLY (GAUZE/BANDAGES/DRESSINGS) ×2 IMPLANT
STAPLER VISISTAT 35W (STAPLE) ×2 IMPLANT
SUCTION FRAZIER TIP 10 FR DISP (SUCTIONS) ×2 IMPLANT
SUT ETHIBOND NAB CT1 #1 30IN (SUTURE) ×7 IMPLANT
SUT VIC AB 1 CTB1 27 (SUTURE) ×3 IMPLANT
SUT VIC AB 2-0 CT1 27 (SUTURE) ×2
SUT VIC AB 2-0 CT1 TAPERPNT 27 (SUTURE) ×2 IMPLANT
SYR CONTROL 10ML LL (SYRINGE) ×3 IMPLANT
TAPE CLOTH SURG 6X10 WHT LF (GAUZE/BANDAGES/DRESSINGS) ×1 IMPLANT
TOWEL OR 17X24 6PK STRL BLUE (TOWEL DISPOSABLE) ×2 IMPLANT
TOWEL OR 17X26 10 PK STRL BLUE (TOWEL DISPOSABLE) ×2 IMPLANT
TOWER CARTRIDGE SMART MIX (DISPOSABLE) IMPLANT
TRAY FOLEY CATH 16FRSI W/METER (SET/KITS/TRAYS/PACK) ×2 IMPLANT
WATER STERILE IRR 1000ML POUR (IV SOLUTION) ×5 IMPLANT

## 2012-12-29 NOTE — Transfer of Care (Addendum)
Immediate Anesthesia Transfer of Care Note  Patient: Martin [name]  Procedure(s) Performed: Procedure(s): TOTAL HIP ARTHROPLASTY WITH AUTOGRAFT (Right)  Patient Location: PACU  Anesthesia Type:General  Level of Consciousness: awake, alert , oriented and patient cooperative  Airway & Oxygen Therapy: Patient Spontanous Breathing and Patient connected to face mask oxygen  Post-op Assessment: Report given to PACU RN  Post vital signs: Reviewed  Complications: No apparent anesthesia complications

## 2012-12-29 NOTE — Progress Notes (Signed)
Utilization review completed. Lindsie Simar, RN, BSN. 

## 2012-12-29 NOTE — Progress Notes (Signed)
Patient hypotensive in PACU, no complaints from patient, denies chest pain, dyspnea.  Remains on O2.  Albumin 5% 250cc x2 bolused, Hb 11.6.  IV Dopamine infusion, and 320 ga radial arterial line placed. 12 lead EKG unchanged from pre-op.  Troponin sent.  Dr. [name] aware and agrees with step-down bed.  He has informed Dr. Donnie Aho, pt's cardiologist for consultation.  Sandford Craze, MD

## 2012-12-29 NOTE — Consult Note (Signed)
PULMONARY  / CRITICAL CARE MEDICINE  Name: Martin [name] MRN: [id number] DOB: September 05, 1930    ADMISSION DATE:  12/29/2012 CONSULTATION DATE:  12/29/12  REFERRING MD :  Martin [name] PRIMARY SERVICE: PCCM  CHIEF COMPLAINT:  Post-op hypotension   BRIEF PATIENT DESCRIPTION: 79 y/o male with PMH of HTN,CHF, MI, CAD s/p CABG 2010, Ischemic cardiomyopathy, chronic afib with ICD who presented 10/10 for R Hip arthroplasty.  Patient became hypotensive in 40's post-op with EBL 1000 cc, 2 U PRBC and 1 U plts given. PCCM consulted for eval and CVL placement for hypotension.  SIGNIFICANT EVENTS / STUDIES:  10/10 OA R Hip, post-op hypotensive event  LINES / TUBES: L radial aline 10/10 >>> Foley Cath 10/10>>> R IJ TLC 10/10>>>  CULTURES: None   ANTIBIOTICS: Cefazolin 10/10 (OR)>>   HISTORY OF PRESENT ILLNESS:  78 y/o male with PMH of HTN,CHF (EF 25%) , MI, CAD s/p CABG 2010, Ischemic cardiomyopathy, chronic afib with ICD (followed by Dr. Donnie Aho), chronic thrombocytopenia, L breast cancer s/p mastectomy (1991), hx of bourbon / gin daily consumption who presented 10/10 for OA R Hip. Patient has a history of pain and functional disability in the right hip(s) due to arthritis and has failed non-surgical conservative treatments include NSAID's and/or analgesics, corticosteriod injections and activity modification. Onset of symptoms was gradual starting 5 years ago with gradually worsening course since that time. Pre-op patient reported night pain, worsening of pain with activity and weight bearing, pain that interfers with activities of daily living and pain with passive range of motion. Intra-op EBL of ~ 1L.  Patient was transfused with 2 units of PRBC, platelets and fluid boluses and was placed on dopamine. Attempts with intra-op neo that did not have significant effect on blood pressure.  Low BP of 40's systolic noted.  In PACU, patient remained hypotensive despite dopamine.  Also noted to have AFIB with RVR.   ABG wnl post case with excellent mental status.  Labs pending.   PCCM consulted for eval and CVL placement for hypotension.    Pt c/o nausea, feeling cold and R hip pain.  Denies chest pain, fevers, abdominal pain, constipation.   PAST MEDICAL HISTORY :  Past Medical History  Diagnosis Date  . Gout   . Hypertension   . Osteoarthritis   . Cardiomyopathy     ischemic  . Coronary artery disease   . Old anterior wall myocardial infarction   . 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 Atrial fibrillation   . Automatic implantable cardioverter-defibrillator in situ     dr Ladona Ridgel   Past Surgical History  Procedure Laterality Date  . Mastectomy, partial      left breast 1991  . Arthroplasty    . Anginoplasty      1995  . Icd    . Insert / replace / remove pacemaker      icd  . Coronary artery bypass graft      triple bypass in June 1985  . Eye surgery    . Joint replacement      bil  . Hip arthroplasty    . Cardiac catheterization      x4   Prior to Admission medications   Medication Sig Start Date End Date Taking? Authorizing Provider  allopurinol (ZYLOPRIM) 300 MG tablet Take 1 tablet by mouth daily. 10/17/12  Yes Historical Provider, MD  apixaban (ELIQUIS) 5 MG TABS tablet Take 5 mg by mouth 2 (two) times daily.   Yes Historical Provider, MD  colchicine  0.6 MG tablet Take 0.6 mg by mouth daily.     Yes Historical Provider, MD  ferrous sulfate 325 (65 FE) MG tablet Take 325 mg by mouth daily with breakfast.   Yes Historical Provider, MD  furosemide (LASIX) 40 MG tablet Take 40 mg by mouth 2 (two) times daily.     Yes Historical Provider, MD  metoprolol (LOPRESSOR) 50 MG tablet Take 50 mg by mouth 2 (two) times daily.     Yes Historical Provider, MD  multivitamin [facility]) per tablet Take 1 tablet by mouth daily.     Yes Historical Provider, MD  potassium chloride SA (K-DUR,KLOR-CON) 20 MEQ tablet Take 20 mEq by mouth daily.     Yes Historical Provider, MD  rosuvastatin  (CRESTOR) 20 MG tablet Take 20 mg by mouth daily.     Yes Historical Provider, MD  terazosin (HYTRIN) 5 MG capsule Take 5 mg by mouth at bedtime.     Yes Historical Provider, MD  valsartan (DIOVAN) 80 MG tablet Take 80 mg by mouth daily.   Yes Historical Provider, MD  vitamin C (ASCORBIC ACID) 500 MG tablet Take 500 mg by mouth daily.     Yes Historical Provider, MD  oxyCODONE-acetaminophen (ROXICET) 5-325 MG per tablet 1-2 tabs po q4-6hrs prn pain 12/29/12   Margart Sickles, PA-C   No Known Allergies  FAMILY HISTORY:  Family History  Problem Relation Age of Onset  . Heart attack Father    SOCIAL HISTORY:  reports that he quit smoking about 48 years ago. His smoking use included Cigarettes. He has a 20 pack-year smoking history. He does not have any smokeless tobacco history on file. He reports that he drinks alcohol. His drug history is not on file.  REVIEW OF SYSTEMS:  Negative unless stated in HPI   SUBJECTIVE: patient resting comfortably in bed. Still recovering from intraoperative sedation   VITAL SIGNS: Temp:  [97.3 F (36.3 C)-98.4 F (36.9 C)] 97.7 F (36.5 C) (10/10 1412) Pulse Rate:  [49-116] 109 (10/10 1317) Resp:  [5-28] 17 (10/10 1317) BP: (41-122)/(30-75) 64/51 mmHg (10/10 1317) SpO2:  [84 %-100 %] 97 % (10/10 1317) Arterial Line BP: (79-95)/(38-43) 79/38 mmHg (10/10 1317)  INTAKE / OUTPUT: Intake/Output     10/09 0701 - 10/10 0700 10/10 0701 - 10/11 0700   I.V.  1250   Blood  1331   Total Intake   2581   Urine  400   Blood  1000   Total Output   1400   Net   +1181          PHYSICAL EXAMINATION: General:  WDWN male, no acute distress Neuro:  A & O x4, responds appropriately HEENT:  NCAT, PERRL, no jvd Cardiovascular:  Irregularly, irregular rate, distal pulses intact Lungs:  resp's even/non-labored on [place], Clear to auscultation bilaterally  Abdomen: soft, non tender, + BS Musculoskeletal:  right hip dressing in place Skin:  Warm and dry, no edema    LABS:  CBC Recent Labs     12/29/12  1021  12/29/12  1120  12/29/12  1330  HGB  11.9*  11.6*  9.2*  HCT  35.0*  34.0*  27.0*   BMET Recent Labs     12/29/12  0928  12/29/12  1021  12/29/12  1120  12/29/12  1330  NA  141  141  143  143  K  3.7  4.1  3.6  3.7  GLUCOSE  165*  210*  197*   --  ABG Recent Labs     12/29/12  1330  PHART  7.392  PCO2ART  41.5  PO2ART  61.0*   Cardiac Enzymes Recent Labs     12/29/12  1215  TROPONINI  <0.30   Glucose No results found for this basename: GLUCAP,  in the last 72 hours  Imaging Dg Hip Operative Right  12/29/2012   CLINICAL DATA:  Total hip arthroplasty  EXAM: DG OPERATIVE RIGHT HIP  TECHNIQUE: A single spot fluoroscopic AP image of the right hip is submitted.  COMPARISON:  None.  FINDINGS: The right femoral neck is not clearly visualized. The proximal femur is noted. A prosthetic device is not visualized.  IMPRESSION: Absence of the femoral head.   Electronically Signed   By: Maryclare Bean M.D.   On: 12/29/2012 10:24   Dg Pelvis Portable  12/29/2012   *RADIOLOGY REPORT*  Clinical Data: Postop right total hip replacement, initial encounter.  PORTABLE PELVIS  Comparison: None  Findings:  The patient undergone a right total hip replacement.  Alignment appears near anatomic.  No evidence of hardware failure or loosening.  Skin staples overlie the upper outer aspect of the thigh.  There are expected scattered foci of subcutaneous emphysema about the operative site.  No radiopaque foreign body.  No fracture.  Limited visualization of the contralateral left total hip replacement is normal.  IMPRESSION: Post right total hip replacement without evidence of complication.   Original Report Authenticated By: Tacey Ruiz, MD   Dg Hip Portable 1 View Right  12/29/2012   *RADIOLOGY REPORT*  Clinical Data: Post right total hip replacement  PORTABLE RIGHT HIP - 1 VIEW  Comparison: Earlier same day  Findings:  This examination is interpreted  in conjunction with AP pelvis radiograph performed earlier same day.  The patient has undergone a right total hip replacement.  Alignment appears near anatomic.  No evidence of hardware failure or loosening.  No fracture.  Skin staples overlie the upper outer aspect of the right thigh.  There are expected scattered foci of subcutaneous emphysema about the operative site.  No radiopaque foreign body.  IMPRESSION: Post right total hip replacement without evidence of complication.   Original Report Authenticated By: Tacey Ruiz, MD    ASSESSMENT / PLAN: PULMONARY A: At Risk Atlectasis - in setting of post-op hip arthroplasty High risk edema P:   - Sp02 > 92% - continue to monitor and give supportive O2 as needed - IS as able  -ABG from a line consideration -pcxr in am  -may need lasix with products  CARDIOVASCULAR A:  Chronic afib with post op RVR - in setting of post operative administration of Dopamine + volume loss  CHF - (EF 25% 2009) pt has ICD CAD - ischemic cardiomyopathy, s/p CABG 2010.  Initial post op troponin neg Hypotension - post operative likely hypovolemic, r/o cardiogenic source  Abnormal EKG- RBBB, afib, left axis dev P:  - Neo & vasopression for MAP >65, if fail , levophed - cycle cardiac enzymes - check lactic acid level - hold lasix, metoprolol, avapro, hytrin - continue statin  - cvp monitor - pxcr - repeat ABG - assess cortisol level - repeat EKG in am - consider ECHO - place central line -CBC and appropriate Transfusion -avoid gross volume with products, reduce to 50 cc/hr , ef noted  RENAL A:   At risk AKI - in setting of volume loss, hypotension.  Hx of sr cr 1.79 in 2009 P:   -  bmet now, assess lytes / renal fxn.  - NS to 50  - KCL  GASTROINTESTINAL A:   Nausea / Vomiting P:   - NPO until nausea resolves - PRN zofran - resume cardiac diet when able  HEMATOLOGIC A:   Chronic Thrombocytopenia - followed by Dr. Cyndie Chime; thought r/t  previous chemo agents for male breast CA + ETOH.  Likely mild myelodysplastic syndrome Acute blood loss anemia - intraoperative loss of 1000cc coagulapthy P:  - monitor CBC frequent - transfusion additional I unit plts and 2 FFP - Scds for DVT prophylaxis - hold eliquis  - iron  -STAT coags  INFECTIOUS A:   Post Op R Hip Arthroplasty P:   - cbc and fever curve - abx as above  ENDOCRINE A:   No acute issues P:    -monitor CBGs   NEUROLOGIC A:   Post operative pain  Hx of Daily ETOH P:   - hold Oxycodone/ dilaudid - caution with hypotension  - Fentanyl for pain  - PT/OT when able    Corrie Baglia PA-S [facility]  Canary Brim, NP-C [place] [facility] Pgr: [phone] or [phone]  I have personally obtained a history, examined the patient, evaluated laboratory and imaging results, formulated the assessment and plan and placed orders.  CRITICAL CARE: The patient is critically ill with multiple organ systems failure and requires high complexity decision making for assessment and support, frequent evaluation and titration of therapies, application of advanced monitoring technologies and extensive interpretation of multiple databases. Critical Care Time devoted to patient care services described in this note is  40 minutes.   12/29/2012, 2:28 PM  Mcarthur Rossetti. Tyson Alias, MD, FACP Pgr: [phone] [place] [facility]

## 2012-12-29 NOTE — Anesthesia Postprocedure Evaluation (Signed)
  Anesthesia Post-op Note  Patient: Martin [name]  Procedure(s) Performed: Procedure(s): TOTAL HIP ARTHROPLASTY WITH AUTOGRAFT (Right)  Patient Location: PACU  Anesthesia Type:General  Level of Consciousness: awake, alert , oriented and patient cooperative  Airway and Oxygen Therapy: Patient Spontanous Breathing and Patient connected to nasal cannula oxygen  Post-op Pain: mild  Post-op Assessment: Post-op Vital signs reviewed, Patient's Cardiovascular Status Stable, Respiratory Function Stable, Patent Airway, No signs of Nausea or vomiting and Pain level controlled:  Stabilized with Dopamine and volume resusc. Report to Donnie Aho (cards) and Tyson Alias (critical care), who will provide care in the ICU  Post-op Vital Signs: Reviewed and stable  Complications: No apparent anesthesia complications, pt's hypotension appears to be related to hypovolemia with blood loss

## 2012-12-29 NOTE — Progress Notes (Addendum)
MD Caffrey called to make aware of blood loss upon arrival to ICU, pt had fully saturated pad and dressing shortly after admission at 1515, no new orders.  CCM made aware of increased temperature during admission of platelets, orders to continue transfusion.  CCM managing blood loss at this time.  Toula Moos

## 2012-12-29 NOTE — H&P (View-Only) (Signed)
TOTAL HIP ADMISSION H&P  Patient is admitted for right total hip arthroplasty.  Subjective:  Chief Complaint: right hip pain  HPI: Martin [name], 79 y.o. male, has a history of pain and functional disability in the right hip(s) due to arthritis and patient has failed non-surgical conservative treatments for greater than 12 weeks to include NSAID's and/or analgesics, corticosteriod injections and activity modification.  Onset of symptoms was gradual starting 5 years ago with gradually worsening course since that time.The patient noted no past surgery on the right hip(s).  Patient currently rates pain in the right hip at 8 out of 10 with activity. Patient has night pain, worsening of pain with activity and weight bearing, pain that interfers with activities of daily living and pain with passive range of motion. Patient has evidence of periarticular osteophytes and joint space narrowing by imaging studies. This condition presents safety issues increasing the risk of falls.  There is no current active infection.  Patient Active Problem List   Diagnosis Date Noted  . 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 ICD-[employer] 11/25/2008  . GOUT 11/21/2008  . HYPERTENSION 11/21/2008  . MYOCARDIAL INFARCTION, HX OF 11/21/2008  . CARDIOMYOPATHY, ISCHEMIC 11/21/2008  . ATRIAL FIBRILLATION, CHRONIC 11/21/2008  . CHRONIC SYSTOLIC HEART FAILURE 11/21/2008  . OSTEOARTHRITIS 11/21/2008  . CORONARY ARTERY BYPASS GRAFT, HX OF 11/21/2008   Past Medical History  Diagnosis Date  . Gout   . Hypertension   . Osteoarthritis   . CHF (congestive heart failure)   . Cardiomyopathy     ischemic  . Coronary artery disease   . Heart attack   . 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 Arrhythmia     afib     dt tilly  . Automatic implantable cardioverter-defibrillator in situ     dr Taylor    Past Surgical History  Procedure Laterality Date  . Mastectomy, partial      left breast 1991  . Arthroplasty    .  Anginoplasty      1995  . Icd    . Insert / replace / remove pacemaker      icd  . Coronary artery bypass graft      triple bypass in June 1985  . Eye surgery    . Joint replacement      bil  . Hip arthroplasty    . Cardiac catheterization      x4     (Not in a hospital admission) No Known Allergies  History  Substance Use Topics  . Smoking status: Former Smoker -- 2.00 packs/day for 10 years    Types: Cigarettes    Quit date: 03/22/1964  . Smokeless tobacco: Not on file  . Alcohol Use: Yes    Family History  Problem Relation Age of Onset  . Heart attack Father      Review of Systems  Constitutional: Negative.   HENT: Positive for hearing loss. Negative for congestion, ear discharge, ear pain, nosebleeds, sore throat and tinnitus.   Eyes: Negative.   Respiratory: Negative.  Negative for stridor.   Cardiovascular: Positive for chest pain. Negative for palpitations, orthopnea, claudication, leg swelling and PND.  Gastrointestinal: Negative.   Genitourinary: Positive for frequency. Negative for dysuria, urgency, hematuria and flank pain.  Musculoskeletal: Positive for joint pain. Negative for falls.  Skin: Negative.   Neurological: Negative.  Negative for headaches.  Endo/Heme/Allergies: Bruises/bleeds easily.  Psychiatric/Behavioral: Negative.     Objective:  Physical Exam  Constitutional: He is oriented to person, place, and   time. He appears well-developed and well-nourished. No distress.  HENT:  Head: Normocephalic and atraumatic.  Nose: Nose normal.  Eyes: Conjunctivae and EOM are normal. Pupils are equal, round, and reactive to light.  Neck: Normal range of motion. Neck supple.  Cardiovascular: Normal rate and intact distal pulses.  An irregularly irregular rhythm present.  Respiratory: Effort normal and breath sounds normal. No respiratory distress. He has no wheezes. He exhibits no tenderness.  GI: Soft. Bowel sounds are normal. He exhibits no distension.  There is no tenderness.  Musculoskeletal:       Right hip: He exhibits decreased range of motion, decreased strength and bony tenderness.  Lymphadenopathy:    He has no cervical adenopathy.  Neurological: He is alert and oriented to person, place, and time. No cranial nerve deficit.  Skin: Skin is warm and dry. No rash noted. No erythema.  Psychiatric: He has a normal mood and affect. His behavior is normal.    Vital signs in last 24 hours: @VSRANGES@  Labs:   Estimated body mass index is 32.39 kg/(m^2) as calculated from the following:   Height as of 12/21/12: 5' 8" (1.727 m).   Weight as of 12/21/12: 96.616 kg (213 lb).   Imaging Review Plain radiographs demonstrate severe degenerative joint disease of the right hip(s). The bone quality appears to be good for age and reported activity level.  Assessment/Plan:  End stage arthritis, right hip(s)  The patient history, physical examination, clinical judgement of the provider and imaging studies are consistent with end stage degenerative joint disease of the right hip(s) and total hip arthroplasty is deemed medically necessary. The treatment options including medical management, injection therapy, arthroscopy and arthroplasty were discussed at length. The risks and benefits of total hip arthroplasty were presented and reviewed. The risks due to aseptic loosening, infection, stiffness, dislocation/subluxation,  thromboembolic complications and other imponderables were discussed.  The patient acknowledged the explanation, agreed to proceed with the plan and consent was signed. Patient is being admitted for inpatient treatment for surgery, pain control, PT, OT, prophylactic antibiotics, VTE prophylaxis, progressive ambulation and ADL's and discharge planning.The patient is planning to be discharged home with home health services 

## 2012-12-29 NOTE — Anesthesia Procedure Notes (Signed)
Procedure Name: Intubation Date/Time: 12/29/2012 7:41 AM Performed by: Lovie Chol Pre-anesthesia Checklist: Patient identified, Emergency Drugs available, Suction available, Patient being monitored and Timeout performed Patient Re-evaluated:Patient Re-evaluated prior to inductionOxygen Delivery Method: Circle system utilized Preoxygenation: Pre-oxygenation with 100% oxygen Intubation Type: IV induction Ventilation: Mask ventilation without difficulty and Oral airway inserted - appropriate to patient size Laryngoscope Size: Miller and 2 Grade View: Grade I Tube type: Oral Tube size: 7.5 mm Number of attempts: 1 Airway Equipment and Method: Stylet Placement Confirmation: ETT inserted through vocal cords under direct vision,  positive ETCO2,  CO2 detector and breath sounds checked- equal and bilateral Secured at: 22 cm Tube secured with: Tape Dental Injury: Teeth and Oropharynx as per pre-operative assessment

## 2012-12-29 NOTE — Brief Op Note (Signed)
12/29/2012  10:58 AM  PATIENT:  Philip Aspen  79 y.o. male  PRE-OPERATIVE DIAGNOSIS:  OA R HIP  POST-OPERATIVE DIAGNOSIS:  OA R HIP  PROCEDURE:  Procedure(s): TOTAL HIP ARTHROPLASTY WITH AUTOGRAFT (Right)  SURGEON:  Surgeon(s) and Role:    * W D Carloyn Manner., MD - Primary  PHYSICIAN ASSISTANT: Margart Sickles, PA-C  ASSISTANTS:    ANESTHESIA:   local and general  EBL:  Total I/O In: 2081 [I.V.:1250; Blood:831] Out: 1300 [Urine:300; Blood:1000]  BLOOD ADMINISTERED:1 unit CC PRBC and 1 unit PLTS  DRAINS: none   LOCAL MEDICATIONS USED:  MARCAINE     SPECIMEN:  No Specimen  DISPOSITION OF SPECIMEN:  N/A  COUNTS:  YES  TOURNIQUET:  * No tourniquets in log *  DICTATION: .Other Dictation: Dictation Number unknown  PLAN OF CARE: Admit to inpatient   PATIENT DISPOSITION:  PACU - hemodynamically stable.   Delay start of Pharmacological VTE agent (>24hrs) due to surgical blood loss or risk of bleeding: yes

## 2012-12-29 NOTE — Progress Notes (Signed)
Appreciate CCM help.  Currently on neo and vasopressin with BP around 90 by aline.  ALert and awake without nausea.   Darden Palmer MD [facility]

## 2012-12-29 NOTE — Anesthesia Preprocedure Evaluation (Addendum)
Anesthesia Evaluation  Patient identified by MRN, date of birth, ID band Patient awake    Reviewed: Allergy & Precautions, H&P , NPO status , Patient's Chart, lab work & pertinent test results, reviewed documented beta blocker date and time   History of Anesthesia Complications Negative for: history of anesthetic complications  Airway Mallampati: III TM Distance: >3 FB Neck ROM: Full    Dental  (+) Teeth Intact and Dental Advisory Given   Pulmonary neg pulmonary ROS, former smoker (quit '66),  breath sounds clear to auscultation  Pulmonary exam normal       Cardiovascular hypertension, Pt. on medications and Pt. on home beta blockers + CAD, + Past MI, + Cardiac Stents, + CABG and +CHF + dysrhythmias (INR 1.57) Atrial Fibrillation + Cardiac Defibrillator (for EF 25%) Rhythm:Irregular Rate:Bradycardia  '09 ECHO: ant wall hypokinesis, EF 40% 7/14: stress EF 32% with antero-apical scar, no ischemia   Neuro/Psych negative neurological ROS  negative psych ROS   GI/Hepatic negative GI ROS, Neg liver ROS,   Endo/Other  negative endocrine ROS  Renal/GU Renal InsufficiencyRenal disease (creat 1.57)     Musculoskeletal   Abdominal (+) + obese,   Peds  Hematology  (+) Blood dyscrasia (plt 88K), ,   Anesthesia Other Findings   Reproductive/Obstetrics negative OB ROS                        Anesthesia Physical Anesthesia Plan  ASA: IV  Anesthesia Plan: General   Post-op Pain Management:    Induction: Intravenous  Airway Management Planned: Oral ETT  Additional Equipment:   Intra-op Plan:   Post-operative Plan: Extubation in OR  Informed Consent: I have reviewed the patients History and Physical, chart, labs and discussed the procedure including the risks, benefits and alternatives for the proposed anesthesia with the patient or authorized representative who has indicated his/her understanding and  acceptance.   Dental advisory given  Plan Discussed with: CRNA and Surgeon  Anesthesia Plan Comments: (Plan routine monitors, GETA Magnet available for AICD deactivation if needed)        Anesthesia Quick Evaluation

## 2012-12-29 NOTE — Preoperative (Signed)
Beta Blockers   Reason not to administer Beta Blockers:Not Applicable 

## 2012-12-29 NOTE — Procedures (Signed)
Central Venous Catheter Insertion Procedure Note Martin [name] [id number] Aug 12, 1930  Procedure: Insertion of Central Venous Catheter Indications: Assessment of intravascular volume, Drug and/or fluid administration and Frequent blood sampling  Procedure Details Consent: Risks of procedure as well as the alternatives and risks of each were explained to the (patient/caregiver).  Consent for procedure obtained. Time Out: Verified patient identification, verified procedure, site/side was marked, verified correct patient position, special equipment/implants available, medications/allergies/relevent history reviewed, required imaging and test results available.  Performed  Maximum sterile technique was used including antiseptics, cap, gloves, gown, hand hygiene, mask and sheet. Skin prep: Chlorhexidine; local anesthetic administered A antimicrobial bonded/coated triple lumen catheter was placed to 16 cm in the right internal jugular vein using the Seldinger technique.  Sutures placed at adjustable site and fixed to prevent dislodgement.   Evaluation Blood flow good Complications: No apparent complications Patient did tolerate procedure well. Chest X-ray ordered to verify placement.  CXR: pending.  Procedure performed under direct supervision of Dr. Tyson Alias and with ultrasound guidance for real time vessel cannulation.     Canary Brim, NP-C [place] [facility] Pgr: [phone] or [phone]    12/29/2012, 3:39 PM  [place] [facility]  Mcarthur Rossetti. Tyson Alias, MD, FACP Pgr: [phone] [place] [facility]

## 2012-12-29 NOTE — Progress Notes (Signed)
12/29/12 1600  Clinical Encounter Type  Visited With Family  Visit Type Spiritual support   Chaplain visited with pt's wife who has been waiting to see pt.  Chaplain offered emotional support through conversation.  While chaplain was visiting pt's wife, nurse came to let her know that she could come to the unit to see her husband  Rulon Abide

## 2012-12-29 NOTE — Consult Note (Signed)
Cardiology Consult Note  Admit date: 12/29/2012 Name: Martin [name] 79 y.o.  male DOB:  April 25, 1930 MRN:  [id number]  Today's date:  12/29/2012  Referring Physician:    Dr. Jairo Ben  Primary Physician:    Dr. Merlene Laughter  Reason for Consultation:    Postoperative evaluation with hypotension  IMPRESSIONS: 1. Postoperative hypotension likely due to volume loss. He has no prior decompensated heart failure prior to admission but does have an adequate cardiac reserve. He is currently being managed with dopamine and volume replacement. His blood pressures currently between 80 and 100. He may need to have critical care consultation hypotension persists for central line. 2. Coronary artery disease with previous ischemic cardiomyopathy and low ejection fraction 3. Chronic atrial fibrillation 4. Long-term anticoagulation with Eliquus 5. History of breast cancer 6. Thrombocytopenia  RECOMMENDATION: He currently has a pressure between 80 and 100 and is not symptomatic. His EKG is nonacute. He will continue to receive volume and I will get critical care medicine involved because he may need a central line to help with volume assessment. Would hold Eliquus for another day or so to be sure he is not having significant bleeding and monitor his CBC. He may need because of his atrial fibrillation to go to a different pressor to avoid effects on heart rate. Monitor serial cardiac enzymes but suspect this is not an ischemic event.  HISTORY: This 79 year old male has a history of ischemic cardiomyopathy with an ejection fraction between 25 and 30%. He had no ischemia on a preoperative Lexiscan Myoview and underwent hip replacement this morning. Evidently lost about a liter of blood and at the end of the case was hypotensive to a pressure of 40. He was given 2 units of cells, albumin and significant fluid and was placed on dopamine. He now has an arterial line inserted in his pressures between 80 and 100  although he is having some tachycardia from the dopamine.  Past Medical History  Diagnosis Date  . Gout   . Hypertension   . Osteoarthritis   . Cardiomyopathy     ischemic  . Coronary artery disease   . Old anterior wall myocardial infarction   . 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 Atrial fibrillation   . Automatic implantable cardioverter-defibrillator in situ     dr Ladona Ridgel      Past Surgical History  Procedure Laterality Date  . Mastectomy, partial      left breast 1991  . Arthroplasty    . Anginoplasty      1995  . Icd    . Insert / replace / remove pacemaker      icd  . Coronary artery bypass graft      triple bypass in June 1985  . Eye surgery    . Joint replacement      bil  . Hip arthroplasty    . Cardiac catheterization      x4     Allergies:  has No Known Allergies.   Medications: Prior to Admission medications   Medication Sig Start Date End Date Taking? Authorizing Provider  allopurinol (ZYLOPRIM) 300 MG tablet Take 1 tablet by mouth daily. 10/17/12  Yes Historical Provider, MD  apixaban (ELIQUIS) 5 MG TABS tablet Take 5 mg by mouth 2 (two) times daily.   Yes Historical Provider, MD  colchicine 0.6 MG tablet Take 0.6 mg by mouth daily.     Yes Historical Provider, MD  ferrous sulfate 325 (65 FE) MG tablet Take 325  mg by mouth daily with breakfast.   Yes Historical Provider, MD  furosemide (LASIX) 40 MG tablet Take 40 mg by mouth 2 (two) times daily.     Yes Historical Provider, MD  metoprolol (LOPRESSOR) 50 MG tablet Take 50 mg by mouth 2 (two) times daily.     Yes Historical Provider, MD  multivitamin [facility]) per tablet Take 1 tablet by mouth daily.     Yes Historical Provider, MD  potassium chloride SA (K-DUR,KLOR-CON) 20 MEQ tablet Take 20 mEq by mouth daily.     Yes Historical Provider, MD  rosuvastatin (CRESTOR) 20 MG tablet Take 20 mg by mouth daily.     Yes Historical Provider, MD  terazosin (HYTRIN) 5 MG capsule Take 5 mg by mouth at  bedtime.     Yes Historical Provider, MD  valsartan (DIOVAN) 80 MG tablet Take 80 mg by mouth daily.   Yes Historical Provider, MD  vitamin C (ASCORBIC ACID) 500 MG tablet Take 500 mg by mouth daily.     Yes Historical Provider, MD  oxyCODONE-acetaminophen (ROXICET) 5-325 MG per tablet 1-2 tabs po q4-6hrs prn pain 12/29/12   Margart Sickles, PA-C    Family History: Family Status  Relation Status Death Age  . Mother Deceased     cancer  . Father Deceased     CHF  . Brother Alive     Social History:   reports that he quit smoking about 48 years ago. His smoking use included Cigarettes. He has a 20 pack-year smoking history. He does not have any smokeless tobacco history on file. He reports that he drinks alcohol.   History   Social History Narrative  . No narrative on file    Review of Systems: Not contributory in the postoperative state  Physical Exam: BP 64/51  Pulse 109  Temp(Src) 98.4 F (36.9 C) (Oral)  Resp 17  SpO2 97%  General appearance: His a pleasant male who is seen in the postsurgical unit and is alert and oriented and not complaining of anything, currently shivering Neck: no adenopathy, no carotid bruit, no JVD and supple, symmetrical, trachea midline Lungs: clear to auscultation bilaterally Heart: Irregular rhythm, normal S1-S2, no S3, heart rate somewhat rapid Abdomen: soft, non-tender; bowel sounds normal; no masses,  no organomegaly Rectal: deferred Extremities: extremities normal, atraumatic, no cyanosis or edema Neurologic: Grossly normal  Labs: CBC  Recent Labs  12/29/12 1120  HGB 11.6*  HCT 34.0*   CMP   Recent Labs  12/29/12 1120  NA 143  K 3.6  GLUCOSE 197*   Cardiac Panel (last 3 results)  Recent Labs  12/29/12 1215  TROPONINI <0.30   EKG: Atrial fibrillation with no acute ST changes, right bundle branch block, previous anterior infarction  Signed:  W. Ashley Royalty MD [facility]   Cardiology Consultant  12/29/2012,  1:35 PM

## 2012-12-29 NOTE — Interval H&P Note (Signed)
History and Physical Interval Note:  12/29/2012 7:29 AM  Martin [name]  has presented today for surgery, with the diagnosis of OA R HIP  The various methods of treatment have been discussed with the patient and family. After consideration of risks, benefits and other options for treatment, the patient has consented to  Procedure(s): TOTAL HIP ARTHROPLASTY WITH AUTOGRAFT (Right) as a surgical intervention .  The patient's history has been reviewed, patient examined, no change in status, stable for surgery.  I have reviewed the patient's chart and labs.  Questions were answered to the patient's satisfaction.     Ladawna Walgren JR,W D

## 2012-12-30 ENCOUNTER — Inpatient Hospital Stay (HOSPITAL_COMMUNITY): Payer: Medicare Other

## 2012-12-30 DIAGNOSIS — R579 Shock, unspecified: Secondary | ICD-10-CM | POA: Diagnosis not present

## 2012-12-30 DIAGNOSIS — D696 Thrombocytopenia, unspecified: Secondary | ICD-10-CM

## 2012-12-30 LAB — BASIC METABOLIC PANEL
BUN: 26 mg/dL — ABNORMAL HIGH (ref 6–23)
Calcium: 7.5 mg/dL — ABNORMAL LOW (ref 8.4–10.5)
Chloride: 102 mEq/L (ref 96–112)
Creatinine, Ser: 1.76 mg/dL — ABNORMAL HIGH (ref 0.50–1.35)
GFR calc Af Amer: 40 mL/min — ABNORMAL LOW (ref >90)
GFR calc non Af Amer: 35 mL/min — ABNORMAL LOW (ref >90)
Sodium: 140 mEq/L (ref 135–145)

## 2012-12-30 LAB — CBC
HCT: 25.3 % — ABNORMAL LOW (ref 39.0–52.0)
HCT: 24.8 % — ABNORMAL LOW (ref 39.0–52.0)
HCT: 24.4 % — ABNORMAL LOW (ref 39.0–52.0)
Hemoglobin: 8.5 g/dL — ABNORMAL LOW (ref 13.0–17.0)
MCHC: 34.8 g/dL (ref 30.0–36.0)
MCHC: 35.1 g/dL (ref 30.0–36.0)
MCV: 94.1 fL (ref 78.0–100.0)
MCV: 94.7 fL (ref 78.0–100.0)
MCV: 94.9 fL (ref 78.0–100.0)
Platelets: 75 10*3/uL — ABNORMAL LOW (ref 150–400)
Platelets: 57 10*3/uL — ABNORMAL LOW (ref 150–400)
RBC: 2.69 MIL/uL — ABNORMAL LOW (ref 4.22–5.81)
RBC: 2.57 MIL/uL — ABNORMAL LOW (ref 4.22–5.81)
RDW: 18.2 % — ABNORMAL HIGH (ref 11.5–15.5)
RDW: 17.9 % — ABNORMAL HIGH (ref 11.5–15.5)
RDW: 17.7 % — ABNORMAL HIGH (ref 11.5–15.5)
WBC: 15.4 10*3/uL — ABNORMAL HIGH (ref 4.0–10.5)
WBC: 12.9 10*3/uL — ABNORMAL HIGH (ref 4.0–10.5)

## 2012-12-30 LAB — GLUCOSE, CAPILLARY
Glucose-Capillary: 114 mg/dL — ABNORMAL HIGH (ref 70–99)
Glucose-Capillary: 133 mg/dL — ABNORMAL HIGH (ref 70–99)
Glucose-Capillary: 138 mg/dL — ABNORMAL HIGH (ref 70–99)
Glucose-Capillary: 145 mg/dL — ABNORMAL HIGH (ref 70–99)

## 2012-12-30 LAB — PREPARE FRESH FROZEN PLASMA
Unit division: 0
Unit division: 0

## 2012-12-30 LAB — PREPARE PLATELET PHERESIS: Unit division: 0

## 2012-12-30 LAB — PRO B NATRIURETIC PEPTIDE: Pro B Natriuretic peptide (BNP): 10715 pg/mL — ABNORMAL HIGH (ref 0–450)

## 2012-12-30 LAB — TROPONIN I: Troponin I: <0.3 ng/mL (ref <0.30)

## 2012-12-30 LAB — CORTISOL: Cortisol, Plasma: 8.9 ug/dL

## 2012-12-30 MED ORDER — HYDROCODONE-ACETAMINOPHEN 5-325 MG PO TABS
1.0000 | ORAL_TABLET | ORAL | Status: DC | PRN
  Administered 2012-12-31 (×2): 1 via ORAL
  Administered 2012-12-31: 2 via ORAL
  Administered 2013-01-01: 1 via ORAL
  Administered 2013-01-01: 2 via ORAL
  Administered 2013-01-01 – 2013-01-05 (×3): 1 via ORAL
  Filled 2012-12-30: qty 2
  Filled 2012-12-30 (×3): qty 1
  Filled 2012-12-30: qty 2
  Filled 2012-12-30: qty 1
  Filled 2012-12-30: qty 2
  Filled 2012-12-30: qty 1

## 2012-12-30 MED ORDER — FUROSEMIDE 10 MG/ML IJ SOLN
20.0000 mg | Freq: Three times a day (TID) | INTRAMUSCULAR | Status: DC
  Administered 2012-12-30 – 2012-12-31 (×2): 20 mg via INTRAVENOUS
  Filled 2012-12-30 (×5): qty 2

## 2012-12-30 MED ORDER — INSULIN ASPART 100 UNIT/ML ~~LOC~~ SOLN
0.0000 [IU] | SUBCUTANEOUS | Status: DC
  Administered 2012-12-30 – 2012-12-31 (×5): 2 [IU] via SUBCUTANEOUS
  Administered 2012-12-31: 3 [IU] via SUBCUTANEOUS
  Administered 2012-12-31 – 2013-01-01 (×4): 2 [IU] via SUBCUTANEOUS
  Administered 2013-01-01: 3 [IU] via SUBCUTANEOUS
  Administered 2013-01-01: 2 [IU] via SUBCUTANEOUS

## 2012-12-30 MED ORDER — FUROSEMIDE 10 MG/ML IJ SOLN
20.0000 mg | Freq: Once | INTRAMUSCULAR | Status: AC
  Administered 2012-12-30: 20 mg via INTRAVENOUS
  Filled 2012-12-30: qty 2

## 2012-12-30 NOTE — Evaluation (Signed)
Physical Therapy Evaluation Patient Details Name: Martin [name] MRN: [id number] DOB: [birth year] Today's Date: 12/30/2012 Time: 1150-1205 PT Time Calculation (min): 15 min  PT Assessment / Plan / Recommendation History of Present Illness  Pt underwent posterior rt THA.  Pt with post-op hypotension requiring pressors and ICU monitoring.  Clinical Impression  Only able to perform limited eval due to BP issues. Needs skilled PT to maximize I and safety so pt can dc to least restrictive environment.  Will wait until able to assess mobility before making DC recommendations.    PT Assessment  Patient needs continued PT services    Follow Up Recommendations  Other (comment) (TBA after mobility assessed)    Does the patient have the potential to tolerate intense rehabilitation      Barriers to Discharge        Equipment Recommendations  None recommended by PT    Recommendations for Other Services     Frequency 7X/week    Precautions / Restrictions Precautions Precautions: Posterior Hip;Fall Required Braces or Orthoses: Knee Immobilizer - Right Knee Immobilizer - Right: Other (comment) (as needed for THA precautions.) Restrictions Weight Bearing Restrictions: Yes RLE Weight Bearing: Weight bearing as tolerated   Pertinent Vitals/Pain See flow sheet      Mobility  Bed Mobility Bed Mobility: Not assessed (due to low BP) Transfers Transfers: Not assessed (due to low BP) Ambulation/Gait Ambulation/Gait Assistance: Not tested (comment) (due to low BP)    Exercises Total Joint Exercises Ankle Circles/Pumps: AROM;Right;10 reps;Supine Quad Sets: AAROM;Right;10 reps;Supine Heel Slides: AAROM;Right;10 reps;Supine Hip ABduction/ADduction: AAROM;Right;10 reps;Supine   PT Diagnosis: Difficulty walking;Acute pain  PT Problem List: Decreased strength;Decreased activity tolerance;Decreased mobility;Pain PT Treatment Interventions: DME instruction;Gait training;Stair  training;Functional mobility training;Therapeutic activities;Therapeutic exercise;Balance training;Patient/family education     PT Goals(Current goals can be found in the care plan section) Acute Rehab PT Goals Patient Stated Goal: return home PT Goal Formulation: With patient Time For Goal Achievement: 01/06/13 Potential to Achieve Goals: Good  Visit Information  Last PT Received On: 12/30/12 Assistance Needed: +2 (for OOB) History of Present Illness: Pt underwent posterior rt THA.  Pt with post-op hypotension requiring pressors and ICU monitoring.       Prior Functioning  Home Living Family/patient expects to be discharged to:: Private residence Living Arrangements: Spouse/significant other Available Help at Discharge: Family;Available 24 hours/day Type of Home: House Home Access: Stairs to enter [employer] of Steps: 6 Entrance Stairs-Rails: None Home Layout: Multi-level;Able to live on main level with bedroom/bathroom Home Equipment: Dan Humphreys - 2 wheels;Cane - single point;Bedside commode;Toilet riser Prior Function Level of Independence: Independent Communication Communication: HOH    Cognition  Cognition Arousal/Alertness: Awake/alert Behavior During Therapy: WFL for tasks assessed/performed Overall Cognitive Status: Within Functional Limits for tasks assessed    Extremity/Trunk Assessment Upper Extremity Assessment Upper Extremity Assessment: Defer to OT evaluation Lower Extremity Assessment Lower Extremity Assessment: RLE deficits/detail RLE Deficits / Details: Fair quad set. Needs significant assist for heel slides and hip abd/add. RLE: Unable to fully assess due to pain   Balance    End of Session PT - End of Session Activity Tolerance: Treatment limited secondary to medical complications (Comment) (low BP) Patient left: in bed;with call bell/phone within reach  GP     [facility] 12/30/2012, 1:38 PM  [facility] PT [phone]

## 2012-12-30 NOTE — Op Note (Signed)
NAME:  Martin [name], Martin [name] NO.:  [id number]  MEDICAL RECORD NO.:  [id number]  LOCATION:  2S08C                        FACILITY:  [facility]  PHYSICIAN:  Dyke Brackett, M.D.    DATE OF BIRTH:  08-13-1930  DATE OF PROCEDURE: DATE OF DISCHARGE:                              OPERATIVE REPORT   INDICATIONS:  An 79 year old male with intractable hip pain on the right, status post left total hip replacement, bilateral knee replacements, thought to be amenable to hospitalization for hip replacement on the right.  PREOPERATIVE DIAGNOSIS:  Osteoarthritis, right hip.  POSTOPERATIVE DIAGNOSIS:  Osteoarthritis, right hip.  OPERATION:  Right total hip replacement using AML large stature stem, 16.5 mm, +5 mm neck length, 36-mm hip Luhrs, 58 mm acetabulum with 10- degree lipped liner.  SURGEON:  Dyke Brackett, M.D.  ASSISTANT:  Margart Sickles, PA-C.  ANESTHESIA:  General.  ESTIMATED BLOOD LOSS:  600.  DESCRIPTION OF PROCEDURE:  He was in lateral position, posterior approach to the hip made splitting the iliotibial band, gluteus maximus fascia.  We split the short external rotators, cut the neck about 1 fingerbreadth above the lesser trochanter, dislocated the hip, cut the head.  We then progressively broached up to a 16.5-mm broach, and then we broached up to that appropriate trial.  Attention was then directed to acetabulum.  We placed acetabular retractors anteriorly and inferiorly with wing superiorly.  We reamed the acetabulum by 15-20 degrees of anteversion with about 45-50 degrees of abduction, and then we placed a trial cup, which did not bottom out at the level of the 54 trial.  We then placed the final cup in the same position, and then once we were trialing with the cup off the broach, the 54 cup became unstable.  In an effort to make the cup more stable, we elected to deepen the acetabulum slightly, changed the version of it to allow better anterior coverage.  In  doing so, we did not ream to a larger diameter, but in changing the direction of the reamer, the cup diameter must have changed and not that we had to go up actually to the 58 cup, the 58 cup trial did not bottom.  We then placed the final cup, again probably 10 degrees of anteversion versus 15-20, and then this seemed to be stable.  We then elected to place 2 acetabular screws within the cup as well again the cup seem very stable after doing that. We elected after the first cup did not seat properly to get an intraoperative x-ray.  No fracture was appreciated at the pelvis or acetabulum.  Once the final cup was inserted with the screws, we then trialed off the broach again elected to use a +5 mm neck length. Stability was good.  We actually could flex the hip to 90, abduct it completely with internal rotation.  The hip was noted to potentially start dislocating at about 45 degrees of internal rotation with extreme flexion and adduction.  Final head was inserted with 36-mm hip [place]. Wound was then copiously irrigated throughout the case.  We then closed with a running #1 Ethibond, 0 and 2-0 Vicryl and skin clips.  Marcaine with epinephrine infiltrated into the skin.  Lightly compressive sterile dressing pressure sterile dressing applied.     Dyke Brackett, M.D.     WDC/MEDQ  D:  12/29/2012  T:  12/30/2012  Job:  [id number]

## 2012-12-30 NOTE — Consult Note (Signed)
PULMONARY  / CRITICAL CARE MEDICINE  Name: Martin [name] MRN: [id number] DOB: 1930/09/30    ADMISSION DATE:  12/29/2012 CONSULTATION DATE:  12/29/12  REFERRING MD :  [name] PRIMARY SERVICE: PCCM  CHIEF COMPLAINT:  Post-op hypotension   BRIEF PATIENT DESCRIPTION:   79 y/o male with PMH of HTN,CHF (EF 25%) , MI, CAD s/p CABG 2010, Ischemic cardiomyopathy, chronic afib with ICD (followed by Dr. Donnie Aho), chronic thrombocytopenia, L breast cancer s/p mastectomy (1991), hx of bourbon / gin daily consumption who presented 10/10 for OA R Hip. Patient has a history of pain and functional disability in the right hip(s) due to arthritis and has failed non-surgical conservative treatments include NSAID's and/or analgesics, corticosteriod injections and activity modification. Onset of symptoms was gradual starting 5 years ago with gradually worsening course since that time. Pre-op patient reported night pain, worsening of pain with activity and weight bearing, pain that interfers with activities of daily living and pain with passive range of motion. Intra-op EBL of ~ 1L.  Patient was transfused with 2 units of PRBC, platelets and fluid boluses and was placed on dopamine. Attempts with intra-op neo that did not have significant effect on blood pressure.  Low BP of 40's systolic noted.  In PACU, patient remained hypotensive despite dopamine.  Also noted to have AFIB with RVR.  ABG wnl post case with excellent mental status.  Labs pending.  PCCM consulted 12/29/12  PCCM consulted for eval and CVL placement for hypotension.    Pt c/o nausea, feeling cold and R hip pain.  Denies chest pain, fevers, abdominal pain, constipation.    LINES / TUBES: L radial aline 10/10 >>> Foley Cath 10/10>>> R IJ TLC 10/10>>>  CULTURES: None   ANTIBIOTICS: Cefazolin 10/10 (OR)>>     SIGNIFICANT EVENTS / STUDIES:  10/10 OA R Hip, post-op hypotensive event    SUBJECTIVE/OVERNIGHT/INTERVAL HX  12/30/12: feeels  better. Still on vasop and neo 115 via CVL. CVP 12. On 28% fM o2. BP is MAP 77 with sbp 110  VITAL SIGNS: Temp:  [97.3 F (36.3 C)-102.5 F (39.2 C)] 98.2 F (36.8 C) (10/11 0751) Pulse Rate:  [29-152] 113 (10/11 0800) Resp:  [5-43] 21 (10/11 0800) BP: (41-116)/(30-98) 110/62 mmHg (10/11 0600) SpO2:  [83 %-100 %] 95 % (10/11 0800) Arterial Line BP: (75-142)/(35-78) 117/62 mmHg (10/11 0700) FiO2 (%):  [28 %] 28 % (10/11 0800) Weight:  [96.6 kg (212 lb 15.4 oz)] 96.6 kg (212 lb 15.4 oz) (10/10 1432)  INTAKE / OUTPUT: Intake/Output     10/10 0701 - 10/11 0700 10/11 0701 - 10/12 0700   I.V. (mL/kg) 2905.6 (30.1) 29 (0.3)   Blood 2248.5    IV Piggyback 600    Total Intake(mL/kg) 5754.1 (59.6) 29 (0.3)   Urine (mL/kg/hr) 1465 (0.6) 125 (0.8)   Blood 1000 (0.4)    Total Output 2465 125   Net +3289.1 -96          PHYSICAL EXAMINATION: General:  Looks well. On face mask o02. Denies complinats Neuro:  A & O x4, responds appropriately. CAM-ICU negative fir delirium. Moves all 4s.  HEENT:  NCAT, PERRL, no jvd Cardiovascular:  Irregularly, irregular rate, distal pulses intact Lungs:  resp's even/non-labored on [place], Clear to auscultation bilaterally  Abdomen: soft, non tender, + BS Musculoskeletal:  right hip dressing in place Skin:  Warm and dry, no edema   LABS:  PULMONARY  Recent Labs Lab 12/29/12 1330 12/29/12 1659 12/29/12 2207  PHART 7.392 7.406  7.379  PCO2ART 41.5 47.9* 43.5  PO2ART 61.0* 242.0* 88.0  HCO3 25.3* 30.0* 25.4*  TCO2 27 31 27   O2SAT 91.0 100.0 96.0    CBC  Recent Labs Lab 12/29/12 1500 12/29/12 2200 12/30/12 0300  HGB 10.0* 9.3* 8.8*  HCT 29.3* 27.8* 25.3*  WBC 11.3* 13.0* 17.4*  PLT 84* 79* 89*    COAGULATION  Recent Labs Lab 12/29/12 1525  INR 1.42    CARDIAC   Recent Labs Lab 12/29/12 1215 12/29/12 1542 12/30/12  TROPONINI <0.30 <0.30 <0.30    Recent Labs Lab 12/30/12 0300  PROBNP 10715.0*     CHEMISTRY  Recent  Labs Lab 12/29/12 0928 12/29/12 1021 12/29/12 1120 12/29/12 1330 12/29/12 1500 12/30/12 0300  NA 141 141 143 143 140 140  K 3.7 4.1 3.6 3.7 3.7 3.8  CL  --   --   --   --  102 102  CO2  --   --   --   --  27 26  GLUCOSE 165* 210* 197*  --  142* 191*  BUN  --   --   --   --  26* 26*  CREATININE  --   --   --   --  1.62* 1.76*  CALCIUM  --   --   --   --  8.1* 7.5*   Estimated Creatinine Clearance: 37.7 ml/min (by C-G formula based on Cr of 1.76).   LIVER  Recent Labs Lab 12/29/12 1525  INR 1.42     INFECTIOUS  Recent Labs Lab 12/29/12 1500  LATICACIDVEN 3.3*     ENDOCRINE CBG (last 3)  No results found for this basename: GLUCAP,  in the last 72 hours       IMAGING x48h  Dg Hip Operative Right  12/29/2012   CLINICAL DATA:  Total hip arthroplasty  EXAM: DG OPERATIVE RIGHT HIP  TECHNIQUE: A single spot fluoroscopic AP image of the right hip is submitted.  COMPARISON:  None.  FINDINGS: The right femoral neck is not clearly visualized. The proximal femur is noted. A prosthetic device is not visualized.  IMPRESSION: Absence of the femoral head.   Electronically Signed   By: Maryclare Bean M.D.   On: 12/29/2012 10:24   Dg Pelvis Portable  12/29/2012   *RADIOLOGY REPORT*  Clinical Data: Postop right total hip replacement, initial encounter.  PORTABLE PELVIS  Comparison: None  Findings:  The patient undergone a right total hip replacement.  Alignment appears near anatomic.  No evidence of hardware failure or loosening.  Skin staples overlie the upper outer aspect of the thigh.  There are expected scattered foci of subcutaneous emphysema about the operative site.  No radiopaque foreign body.  No fracture.  Limited visualization of the contralateral left total hip replacement is normal.  IMPRESSION: Post right total hip replacement without evidence of complication.   Original Report Authenticated By: Tacey Ruiz, MD   Dg Chest Port 1 View  12/30/2012   CLINICAL DATA:   Evaluate for airspace disease.  EXAM: PORTABLE CHEST - 1 VIEW  COMPARISON:  12/29/2012  FINDINGS: Reticular densities in noted in the left lower lung zone, likely atelectasis. There is no convincing pneumonia. No pulmonary edema.  There is no pneumothorax. Changes from cardiac surgery are stable. Mild cardiomegaly is stable.  Right internal jugular central venous line has its tip just above the caval atrial junction, also unchanged.  IMPRESSION: 1. Left basilar opacity is likely atelectasis. No convincing pneumonia and no  evidence of pulmonary edema. 2. No change from the previous day's study.   Electronically Signed   By: Amie Portland M.D.   On: 12/30/2012 07:42   Dg Chest Port 1 View  12/29/2012   CLINICAL DATA:  Pulmonary edema.  EXAM: PORTABLE CHEST - 1 VIEW  COMPARISON:  Same day.  FINDINGS: Stable cardiomegaly. Sternotomy wires are noted. Left-sided pacemaker is unchanged in position. Stable central pulmonary vascular congestion. No acute pulmonary disease or edema is noted.  IMPRESSION: Stable cardiomegaly. No definite evidence of pulmonary edema seen currently.   Electronically Signed   By: Roque Lias M.D.   On: 12/29/2012 20:31   Dg Chest Port 1 View  12/29/2012   *RADIOLOGY REPORT*  Clinical Data: Post right IJ line placement, subsequent encounter.  PORTABLE CHEST - 1 VIEW  Comparison: 12/21/2012  Findings:  Grossly unchanged enlarged cardiac silhouette and mediastinal contours post median sternotomy.  Interval placement of a right jugular approach central venous catheter with tip overlying the superior cavoatrial junction.  Stable positioning of left anterior chest wall single lead AICD / pacemaker.  Mild pulmonary venous congestion without frank evidence of edema.  Slight worsening of bilateral infrahilar opacities favored to represent atelectasis. No definite pleural effusion.  Grossly unchanged bones.  Mild gaseous distension of the stomach.  IMPRESSION: 1.  Interval placement of right  jugular approach central venous catheter with tip overlying the superior cavoatrial junction.  No definite pneumothorax. 2.  Cardiomegaly and pulmonary venous congestion without frank evidence of edema. 3.  Slight worsening of perihilar atelectasis.   Original Report Authenticated By: Tacey Ruiz, MD   Dg Hip Portable 1 View Right  12/29/2012   *RADIOLOGY REPORT*  Clinical Data: Post right total hip replacement  PORTABLE RIGHT HIP - 1 VIEW  Comparison: Earlier same day  Findings:  This examination is interpreted in conjunction with AP pelvis radiograph performed earlier same day.  The patient has undergone a right total hip replacement.  Alignment appears near anatomic.  No evidence of hardware failure or loosening.  No fracture.  Skin staples overlie the upper outer aspect of the right thigh.  There are expected scattered foci of subcutaneous emphysema about the operative site.  No radiopaque foreign body.  IMPRESSION: Post right total hip replacement without evidence of complication.   Original Report Authenticated By: Tacey Ruiz, MD   PULMONARY  Recent Labs Lab 12/29/12 1330 12/29/12 1659 12/29/12 2207  PHART 7.392 7.406 7.379  PCO2ART 41.5 47.9* 43.5  PO2ART 61.0* 242.0* 88.0  HCO3 25.3* 30.0* 25.4*  TCO2 27 31 27   O2SAT 91.0 100.0 96.0    CBC  Recent Labs Lab 12/29/12 1500 12/29/12 2200 12/30/12 0300  HGB 10.0* 9.3* 8.8*  HCT 29.3* 27.8* 25.3*  WBC 11.3* 13.0* 17.4*  PLT 84* 79* 89*    COAGULATION  Recent Labs Lab 12/29/12 1525  INR 1.42    CARDIAC   Recent Labs Lab 12/29/12 1215 12/29/12 1542 12/30/12  TROPONINI <0.30 <0.30 <0.30    Recent Labs Lab 12/30/12 0300  PROBNP 10715.0*     CHEMISTRY  Recent Labs Lab 12/29/12 0928 12/29/12 1021 12/29/12 1120 12/29/12 1330 12/29/12 1500 12/30/12 0300  NA 141 141 143 143 140 140  K 3.7 4.1 3.6 3.7 3.7 3.8  CL  --   --   --   --  102 102  CO2  --   --   --   --  27 26  GLUCOSE 165* 210* 197*  --  142* 191*  BUN  --   --   --   --  26* 26*  CREATININE  --   --   --   --  1.62* 1.76*  CALCIUM  --   --   --   --  8.1* 7.5*   Estimated Creatinine Clearance: 37.7 ml/min (by C-G formula based on Cr of 1.76).   LIVER  Recent Labs Lab 12/29/12 1525  INR 1.42     INFECTIOUS  Recent Labs Lab 12/29/12 1500  LATICACIDVEN 3.3*     ENDOCRINE CBG (last 3)  No results found for this basename: GLUCAP,  in the last 72 hours       IMAGING x48h  Dg Hip Operative Right  12/29/2012   CLINICAL DATA:  Total hip arthroplasty  EXAM: DG OPERATIVE RIGHT HIP  TECHNIQUE: A single spot fluoroscopic AP image of the right hip is submitted.  COMPARISON:  None.  FINDINGS: The right femoral neck is not clearly visualized. The proximal femur is noted. A prosthetic device is not visualized.  IMPRESSION: Absence of the femoral head.   Electronically Signed   By: Maryclare Bean M.D.   On: 12/29/2012 10:24   Dg Pelvis Portable  12/29/2012   *RADIOLOGY REPORT*  Clinical Data: Postop right total hip replacement, initial encounter.  PORTABLE PELVIS  Comparison: None  Findings:  The patient undergone a right total hip replacement.  Alignment appears near anatomic.  No evidence of hardware failure or loosening.  Skin staples overlie the upper outer aspect of the thigh.  There are expected scattered foci of subcutaneous emphysema about the operative site.  No radiopaque foreign body.  No fracture.  Limited visualization of the contralateral left total hip replacement is normal.  IMPRESSION: Post right total hip replacement without evidence of complication.   Original Report Authenticated By: Tacey Ruiz, MD   Dg Chest Port 1 View  12/30/2012   CLINICAL DATA:  Evaluate for airspace disease.  EXAM: PORTABLE CHEST - 1 VIEW  COMPARISON:  12/29/2012  FINDINGS: Reticular densities in noted in the left lower lung zone, likely atelectasis. There is no convincing pneumonia. No pulmonary edema.  There is no pneumothorax.  Changes from cardiac surgery are stable. Mild cardiomegaly is stable.  Right internal jugular central venous line has its tip just above the caval atrial junction, also unchanged.  IMPRESSION: 1. Left basilar opacity is likely atelectasis. No convincing pneumonia and no evidence of pulmonary edema. 2. No change from the previous day's study.   Electronically Signed   By: Amie Portland M.D.   On: 12/30/2012 07:42   Dg Chest Port 1 View  12/29/2012   CLINICAL DATA:  Pulmonary edema.  EXAM: PORTABLE CHEST - 1 VIEW  COMPARISON:  Same day.  FINDINGS: Stable cardiomegaly. Sternotomy wires are noted. Left-sided pacemaker is unchanged in position. Stable central pulmonary vascular congestion. No acute pulmonary disease or edema is noted.  IMPRESSION: Stable cardiomegaly. No definite evidence of pulmonary edema seen currently.   Electronically Signed   By: Roque Lias M.D.   On: 12/29/2012 20:31   Dg Chest Port 1 View  12/29/2012   *RADIOLOGY REPORT*  Clinical Data: Post right IJ line placement, subsequent encounter.  PORTABLE CHEST - 1 VIEW  Comparison: 12/21/2012  Findings:  Grossly unchanged enlarged cardiac silhouette and mediastinal contours post median sternotomy.  Interval placement of a right jugular approach central venous catheter with tip overlying the superior cavoatrial junction.  Stable positioning of left anterior  chest wall single lead AICD / pacemaker.  Mild pulmonary venous congestion without frank evidence of edema.  Slight worsening of bilateral infrahilar opacities favored to represent atelectasis. No definite pleural effusion.  Grossly unchanged bones.  Mild gaseous distension of the stomach.  IMPRESSION: 1.  Interval placement of right jugular approach central venous catheter with tip overlying the superior cavoatrial junction.  No definite pneumothorax. 2.  Cardiomegaly and pulmonary venous congestion without frank evidence of edema. 3.  Slight worsening of perihilar atelectasis.   Original  Report Authenticated By: Tacey Ruiz, MD   Dg Hip Portable 1 View Right  12/29/2012   *RADIOLOGY REPORT*  Clinical Data: Post right total hip replacement  PORTABLE RIGHT HIP - 1 VIEW  Comparison: Earlier same day  Findings:  This examination is interpreted in conjunction with AP pelvis radiograph performed earlier same day.  The patient has undergone a right total hip replacement.  Alignment appears near anatomic.  No evidence of hardware failure or loosening.  No fracture.  Skin staples overlie the upper outer aspect of the right thigh.  There are expected scattered foci of subcutaneous emphysema about the operative site.  No radiopaque foreign body.  IMPRESSION: Post right total hip replacement without evidence of complication.   Original Report Authenticated By: Tacey Ruiz, MD       ASSESSMENT / PLAN: PULMONARY A: At Risk Atlectasis - in setting of post-op hip arthroplasty High risk edema  12/30/12: on face mask o2. Normal WOB  P:   - wean to nasal cannula - Sp02 > 92% - continue to monitor and give supportive O2 as needed - IS as able  -DC a line -  Lasix x 1 dose and then more depending on bp response  CARDIOVASCULAR A:  Chronic afib with post op RVR - in setting of post operative administration of Dopamine + volume loss  CHF - (EF 25% 2009) pt has ICD CAD - ischemic cardiomyopathy, s/p CABG 2010.  Initial post op troponin neg Hypotension - post operative likely hypovolemic, r/o cardiogenic source  Abnormal EKG- RBBB, afib, left axis dev  12/30/12: Improved pressor needs P:  - Neo & vasopression for MAP >55 and sbp > 95 - hold , metoprolol, avapro, hytrin - continue statin  - get ECHO - place central line - DIuresise   RENAL A:   At risk AKI - in setting of volume loss, hypotension.  Hx of sr cr 1.79 in 2009  12/30/12: Net 3.3L positive since admit 12/29/2012  P:   - saline to KV - empiric lasix x 1 even with pressors -monitor  creat   GASTROINTESTINAL A:   Nausea / Vomiting  12/30/12: No nausea/vomot  P:   - - resume cardiac diet when able  HEMATOLOGIC A:   Chronic Thrombocytopenia - followed by Dr. Cyndie Chime; thought r/t previous chemo agents for male breast CA + ETOH.  Likely mild myelodysplastic syndrome Acute blood loss anemia - intraoperative loss of 1000cc coagulapthy  10/11/4: Stable  P:  - monitor CBC; PRBC onoy for hgb < 7gm% (no service to tx blood wihtout talking to CCM) - Scds for DVT prophylaxis - hold eliquis  - iron    INFECTIOUS A:   Post Op R Hip Arthroplasty P:   - cbc and fever curve - abx as above  ENDOCRINE A:   No acute issues P:    -monitor CBGs   NEUROLOGIC A:   Post operative pain  Hx of Daily ETOH P:   -  hold Oxycodone/ dilaudid - caution with hypotension  - Fentanyl for pain  - PT/OT when able    GLOBAL 12/30/12: No family at bedside   The patient is critically ill with multiple organ systems failure and requires high complexity decision making for assessment and support, frequent evaluation and titration of therapies, application of advanced monitoring technologies and extensive interpretation of multiple databases.   Critical Care Time devoted to patient care services described in this note is  31  Minutes.  Dr. Kalman Shan, M.D., [facility].C.P Pulmonary and Critical Care Medicine Staff Physician [facility] [place] [facility] Pager: [phone], If no answer or between  15:00h - 7:00h: call [phone]  12/30/2012 8:59 AM

## 2012-12-30 NOTE — Progress Notes (Signed)
Patient Name: Martin [name] Date of Encounter: 12/30/2012   Principal Problem:   Hypotension Active Problems:   Chronic atrial fibrillation   Chronic systolic heart failure   CAD (coronary artery disease)   Long-term (current) use of anticoagulants   Shock circulatory   SUBJECTIVE  No CP or SOB, mild pain at the right hip.  CURRENT MEDS . atorvastatin  40 mg Oral q1800  . docusate sodium  100 mg Oral BID  . ferrous sulfate  325 mg Oral Q breakfast  . insulin aspart  0-15 Units Subcutaneous Q4H  . multivitamin with minerals  1 tablet Oral Daily  . potassium chloride SA  20 mEq Oral Daily  . vitamin C  500 mg Oral Daily    OBJECTIVE  Filed Vitals:   12/30/12 0800 12/30/12 0900 12/30/12 1000 12/30/12 1100  BP: 112/68 100/48 94/53 105/69  Pulse: 113 122 122 109  Temp:      TempSrc:      Resp: 21 22 14 23   Height:      Weight:      SpO2: 95% 93% 96% 98%    Intake/Output Summary (Last 24 hours) at 12/30/12 1138 Last data filed at 12/30/12 1123  Gross per 24 hour  Intake 3702.1 ml  Output   1360 ml  Net 2342.1 ml   Filed Weights   12/29/12 1432  Weight: 212 lb 15.4 oz (96.6 kg)    PHYSICAL EXAM  General: Pleasant, NAD. Neuro: Alert and oriented X 3. Moves all extremities spontaneously. Psych: Normal affect. HEENT:  Normal  Neck: Supple without bruits or JVD. Lungs:  Resp regular and unlabored, rales at both basis Heart: RRR no s3, s4, or murmurs. Abdomen: Soft, non-tender, non-distended, BS + x 4.  Extremities: No clubbing, cyanosis, mild edema at the ankles. DP/PT/Radials 2+ and equal bilaterally.  Accessory Clinical Findings  CBC  Recent Labs  12/30/12 0300 12/30/12 0940  WBC 17.4* 15.4*  HGB 8.8* 8.7*  HCT 25.3* 24.8*  MCV 94.1 94.7  PLT 89* 75*   Basic Metabolic Panel  Recent Labs  12/29/12 1500 12/30/12 0300  NA 140 140  K 3.7 3.8  CL 102 102  CO2 27 26  GLUCOSE 142* 191*  BUN 26* 26*  CREATININE 1.62* 1.76*  CALCIUM 8.1*  7.5*   Cardiac Enzymes  Recent Labs  12/29/12 1215 12/29/12 1542 12/30/12  TROPONINI <0.30 <0.30 <0.30   TELE  A fib with RVR, HR 100-115  Radiology/Studies  Dg Chest Port 1 View 12/30/2012    IMPRESSION: 1. Left basilar opacity is likely atelectasis. No convincing pneumonia and no evidence of pulmonary edema. 2. No change from the previous day's study.      ASSESSMENT AND PLAN  1. Postoperative hypotension likely due to volume loss requiring support with neosynephrine and vasopressin, now off vasopressin since 8 am today, on 70 mcg of Neo, hemodynamically stable, BP 105/60.   2. CHF - after fluid replacement for hypotension, positive 2300 in the last 24 H, stable BP with lasix 20 mg iv this am, add another 20 mg iv now and schedule for Q8H with BP parameters of SBP > 95 mmHg  3. Coronary artery disease with previous ischemic cardiomyopathy and low ejection fraction   4. Chronic atrial fibrillation, now with RVR due to the pressors and pain, now weaning down pressors   5. Long-term anticoagulation with Eliquis, currently on hold because of potential post op bleeding  6. Hyperlipidemia - on lipitor 40  mg QHS  Signed, Callen Vancuren, H

## 2012-12-30 NOTE — Progress Notes (Signed)
Orthopedic Tech Progress Note Patient Details:  Martin [name] Apr 12, 1930 [id number] Hip abduction pillow delivered to nurse in patient's room. Nurse then put in order for pillow after speaking with her.  Ortho Devices Type of Ortho Device: Abduction pillow Ortho Device/Splint Location: Hip Ortho Device/Splint Interventions: Ordered   [name] 12/30/2012, 7:46 AM

## 2012-12-30 NOTE — Progress Notes (Signed)
OT Cancellation Note  Patient Details Name: Martin [name] MRN: [id number] DOB: Aug 09, 1930   Cancelled Treatment:    Reason Eval/Treat Not Completed: Medical issues which prohibited therapy Discussed with nsg. Will wait until tomorrow due to BP issues. [facility] Buck Mcaffee, OTR/L  [phone] 12/30/2012 12/30/2012, 3:48 PM

## 2012-12-30 NOTE — Progress Notes (Signed)
Patient ID: Martin [name], male   DOB: 1931/01/18, 79 y.o.   MRN: [id number]  PROGRESS NOTE  Subjective:  negative for Chest Pain  negative for Shortness of Breath  positive for Nausea, negative for vomiting  negative for Calf Pain  negative for Bowel Movement   Tolerating Diet:pt states he has not yet been able to eat       Patient reports pain as 4 on 0-10 scale.       Objective: Vital signs in last 24 hours:   Patient Vitals for the past 24 hrs:  BP Temp Temp src Pulse Resp SpO2 Height Weight  12/30/12 1000 94/53 mmHg - - 122 14 96 % - -  12/30/12 0900 100/48 mmHg - - 122 22 93 % - -  12/30/12 0800 112/68 mmHg - - 113 21 95 % - -  12/30/12 0751 104/60 mmHg 98.2 F (36.8 C) Oral - - - - -  12/30/12 0700 - - - 104 25 94 % - -  12/30/12 0645 - - - 109 18 97 % - -  12/30/12 0630 - - - - 24 - - -  12/30/12 0615 - - - - 22 - - -  12/30/12 0600 110/62 mmHg - - 112 28 96 % - -  12/30/12 0545 - 99.3 F (37.4 C) Axillary 118 25 97 % - -  12/30/12 0530 - - - 107 28 97 % - -  12/30/12 0515 - - - 109 26 97 % - -  12/30/12 0500 115/61 mmHg - - 123 28 96 % - -  12/30/12 0445 - - - 111 25 97 % - -  12/30/12 0430 - - - 131 27 96 % - -  12/30/12 0415 - - - 127 27 96 % - -  12/30/12 0400 107/64 mmHg - - 105 25 95 % - -  12/30/12 0345 - - - 69 31 98 % - -  12/30/12 0330 - - - 127 26 96 % - -  12/30/12 0315 - - - 107 28 95 % - -  12/30/12 0300 114/98 mmHg - - 107 27 96 % - -  12/30/12 0245 - - - 114 30 94 % - -  12/30/12 0230 - - - 110 25 96 % - -  12/30/12 0215 - - - 117 20 97 % - -  12/30/12 0200 114/62 mmHg - - 106 25 96 % - -  12/30/12 0145 - - - 121 26 97 % - -  12/30/12 0130 - - - 110 25 96 % - -  12/30/12 0115 - - - 116 25 96 % - -  12/30/12 0100 116/57 mmHg - - 120 31 97 % - -  12/30/12 0045 - - - 117 27 94 % - -  12/30/12 0030 - - - 127 27 97 % - -  12/30/12 0015 - - - 127 24 99 % - -  12/30/12 0000 97/68 mmHg 99.5 F (37.5 C) Axillary 107 27 98 % - -  12/29/12 2345 - - -  105 28 99 % - -  12/29/12 2330 - - - 129 33 98 % - -  12/29/12 2315 - - - 129 31 100 % - -  12/29/12 2300 106/58 mmHg - - 110 33 100 % - -  12/29/12 2245 - - - 117 25 100 % - -  12/29/12 2230 - - - 121 28 100 % - -  12/29/12 2215 - - -  126 - 100 % - -  12/29/12 2200 99/50 mmHg 100.3 F (37.9 C) Axillary 124 - 100 % - -  12/29/12 2145 - - - 125 - 92 % - -  12/29/12 2130 - 100.7 F (38.2 C) - 79 33 100 % - -  12/29/12 2115 - - - 129 33 98 % - -  12/29/12 2100 98/69 mmHg 101.3 F (38.5 C) - 112 33 98 % - -  12/29/12 2045 - - - 110 35 97 % - -  12/29/12 2030 - 101.6 F (38.7 C) Axillary 51 28 97 % - -  12/29/12 2015 - - - 81 36 97 % - -  12/29/12 2000 83/63 mmHg - - 116 40 96 % - -  12/29/12 1945 - - - 146 36 96 % - -  12/29/12 1930 - - - 127 39 100 % - -  12/29/12 1915 - - - 120 30 100 % - -  12/29/12 1900 101/46 mmHg - - 52 24 99 % - -  12/29/12 1840 - 100.2 F (37.9 C) Axillary 151 34 98 % - -  12/29/12 1825 - - - 116 27 100 % - -  12/29/12 1820 - 102.5 F (39.2 C) Axillary 118 28 100 % - -  12/29/12 1800 77/44 mmHg - - 106 30 100 % - -  12/29/12 1745 - 100 F (37.8 C) - 123 43 100 % - -  12/29/12 1730 - 100 F (37.8 C) - 125 25 100 % - -  12/29/12 1715 - - - 147 25 100 % - -  12/29/12 1700 - - - 45 - 98 % - -  12/29/12 1645 98/79 mmHg - - 97 23 83 % - -  12/29/12 1630 75/53 mmHg 102.1 F (38.9 C) Axillary 136 30 91 % - -  12/29/12 1615 82/58 mmHg 99.2 F (37.3 C) Oral 126 38 95 % - -  12/29/12 1600 74/53 mmHg 97.9 F (36.6 C) Oral 152 21 97 % - -  12/29/12 1545 104/65 mmHg - - 137 29 87 % - -  12/29/12 1530 99/57 mmHg - - 128 26 100 % - -  12/29/12 1515 92/58 mmHg - - 143 17 93 % - -  12/29/12 1500 95/60 mmHg - - 29 30 100 % - -  12/29/12 1432 88/54 mmHg - - 132 23 95 % 5\' 8"  (1.727 m) 96.6 kg (212 lb 15.4 oz)  12/29/12 1417 88/55 mmHg - - 113 22 92 % - -  12/29/12 1412 - 97.7 F (36.5 C) - - - - - -  12/29/12 1402 69/41 mmHg - - 104 16 88 % - -  12/29/12 1400 -  97.3 F (36.3 C) Oral - - - - -  12/29/12 1347 81/47 mmHg - - 108 18 90 % - -  12/29/12 1332 73/38 mmHg - - 114 20 94 % - -  12/29/12 1317 64/51 mmHg - - 109 17 97 % - -  12/29/12 1307 87/60 mmHg - - 116 22 93 % - -  12/29/12 1302 79/52 mmHg - - 106 14 95 % - -  12/29/12 1247 81/51 mmHg - - 111 12 94 % - -  12/29/12 1232 74/41 mmHg - - 109 16 100 % - -  12/29/12 1217 77/49 mmHg - - 100 17 99 % - -  12/29/12 1209 - - - 96 15 87 % - -  12/29/12 1208 73/43 mmHg - - 100  13 85 % - -  12/29/12 1202 63/39 mmHg - - 107 12 88 % - -  12/29/12 1153 57/34 mmHg - - 96 10 99 % - -  12/29/12 1148 61/43 mmHg - - 69 13 99 % - -  12/29/12 1146 48/34 mmHg - - 55 11 98 % - -  12/29/12 1142 41/31 mmHg - - 92 5 91 % - -  12/29/12 1140 50/30 mmHg - - 49 9 94 % - -  12/29/12 1132 58/36 mmHg - - 88 10 95 % - -  12/29/12 1129 55/33 mmHg - - 71 14 84 % - -      Intake/Output from previous day:   10/10 0701 - 10/11 0700 In: 5754.1 [I.V.:2905.6] Out: 2465 [Urine:1465]   Intake/Output this shift:   10/11 0701 - 10/11 1900 In: 29 [I.V.:29] Out: 125 [Urine:125]   Intake/Output     10/10 0701 - 10/11 0700 10/11 0701 - 10/12 0700   I.V. (mL/kg) 2905.6 (30.1) 29 (0.3)   Blood 2248.5    IV Piggyback 600    Total Intake(mL/kg) 5754.1 (59.6) 29 (0.3)   Urine (mL/kg/hr) 1465 (0.6) 125 (0.3)   Blood 1000 (0.4)    Total Output 2465 125   Net +3289.1 -96           LABORATORY DATA:  Recent Labs  12/29/12 1021 12/29/12 1120 12/29/12 1330 12/29/12 1500 12/29/12 2200 12/30/12 0300 12/30/12 0940  WBC  --   --   --  11.3* 13.0* 17.4* 15.4*  HGB 11.9* 11.6* 9.2* 10.0* 9.3* 8.8* 8.7*  HCT 35.0* 34.0* 27.0* 29.3* 27.8* 25.3* 24.8*  PLT  --   --   --  84* 79* 89* 75*    Recent Labs  12/29/12 0857 12/29/12 0928 12/29/12 1021 12/29/12 1120 12/29/12 1330 12/29/12 1500 12/30/12 0300  NA 142 141 141 143 143 140 140  K 3.7 3.7 4.1 3.6 3.7 3.7 3.8  CL  --   --   --   --   --  102 102  CO2  --   --   --    --   --  27 26  BUN  --   --   --   --   --  26* 26*  CREATININE  --   --   --   --   --  1.62* 1.76*  GLUCOSE 148* 165* 210* 197*  --  142* 191*  CALCIUM  --   --   --   --   --  8.1* 7.5*   Lab Results  Component Value Date   INR 1.42 12/29/2012   INR 1.57* 12/21/2012   INR 2.6* 11/22/2007    Examination:  General appearance: alert, cooperative and mild distress  Wound Exam: dressing in place  Drainage:  No drainage noted through dressing  Motor Exam: grossly intact bilateral LE  Sensory Exam: grossly intact bilateral LE    Assessment:    1 Day Post-Op  Procedure(s) (LRB): TOTAL HIP ARTHROPLASTY WITH AUTOGRAFT (Right)  ADDITIONAL DIAGNOSIS:  Principal Problem:   Hypotension Active Problems:   Chronic atrial fibrillation   Chronic systolic heart failure   CAD (coronary artery disease)   Long-term (current) use of anticoagulants   Shock circulatory    Plan: Physical Therapy as ordered Weight Bearing as Tolerated (WBAT) when able   DISCHARGE PLAN: Home with HH once medically stable  DISCHARGE NEEDS: HHPT, DME rec per PT/OT  Domingo Cocking JAMES 12/30/2012, 11:22 AM

## 2012-12-31 LAB — GLUCOSE, CAPILLARY
Glucose-Capillary: 125 mg/dL — ABNORMAL HIGH (ref 70–99)
Glucose-Capillary: 151 mg/dL — ABNORMAL HIGH (ref 70–99)
Glucose-Capillary: 162 mg/dL — ABNORMAL HIGH (ref 70–99)

## 2012-12-31 LAB — CBC WITH DIFFERENTIAL/PLATELET
Basophils Absolute: 0 10*3/uL (ref 0.0–0.1)
Eosinophils Relative: 1 % (ref 0–5)
Hemoglobin: 8.5 g/dL — ABNORMAL LOW (ref 13.0–17.0)
Lymphocytes Relative: 4 % — ABNORMAL LOW (ref 12–46)
Lymphs Abs: 0.6 10*3/uL — ABNORMAL LOW (ref 0.7–4.0)
MCH: 32.9 pg (ref 26.0–34.0)
MCV: 95.3 fL (ref 78.0–100.0)
Monocytes Relative: 5 % (ref 3–12)
Neutro Abs: 12.7 10*3/uL — ABNORMAL HIGH (ref 1.7–7.7)
Neutrophils Relative %: 90 % — ABNORMAL HIGH (ref 43–77)
Platelets: 54 10*3/uL — ABNORMAL LOW (ref 150–400)
RBC: 2.58 MIL/uL — ABNORMAL LOW (ref 4.22–5.81)
WBC: 14.1 10*3/uL — ABNORMAL HIGH (ref 4.0–10.5)

## 2012-12-31 LAB — BASIC METABOLIC PANEL
BUN: 31 mg/dL — ABNORMAL HIGH (ref 6–23)
CO2: 26 mEq/L (ref 19–32)
Chloride: 99 mEq/L (ref 96–112)
Creatinine, Ser: 2.03 mg/dL — ABNORMAL HIGH (ref 0.50–1.35)
GFR calc Af Amer: 34 mL/min — ABNORMAL LOW (ref >90)
Glucose, Bld: 136 mg/dL — ABNORMAL HIGH (ref 70–99)
Potassium: 3.4 mEq/L — ABNORMAL LOW (ref 3.5–5.1)

## 2012-12-31 LAB — PHOSPHORUS: Phosphorus: 3.5 mg/dL (ref 2.3–4.6)

## 2012-12-31 LAB — LACTIC ACID, PLASMA: Lactic Acid, Venous: 1.1 mmol/L (ref 0.5–2.2)

## 2012-12-31 LAB — PRO B NATRIURETIC PEPTIDE: Pro B Natriuretic peptide (BNP): 12411 pg/mL — ABNORMAL HIGH (ref 0–450)

## 2012-12-31 MED ORDER — APIXABAN 2.5 MG PO TABS
2.5000 mg | ORAL_TABLET | Freq: Two times a day (BID) | ORAL | Status: DC
  Administered 2012-12-31 (×2): 2.5 mg via ORAL
  Filled 2012-12-31 (×4): qty 1

## 2012-12-31 MED ORDER — METOPROLOL TARTRATE 12.5 MG HALF TABLET
12.5000 mg | ORAL_TABLET | Freq: Two times a day (BID) | ORAL | Status: DC
  Administered 2012-12-31 – 2013-01-02 (×4): 12.5 mg via ORAL
  Filled 2012-12-31 (×6): qty 1

## 2012-12-31 MED ORDER — FUROSEMIDE 10 MG/ML IJ SOLN
20.0000 mg | Freq: Three times a day (TID) | INTRAMUSCULAR | Status: DC
  Administered 2013-01-01 – 2013-01-02 (×4): 20 mg via INTRAVENOUS
  Filled 2012-12-31 (×6): qty 2

## 2012-12-31 MED ORDER — MAGNESIUM SULFATE IN D5W 10-5 MG/ML-% IV SOLN
1.0000 g | Freq: Once | INTRAVENOUS | Status: AC
  Administered 2012-12-31: 1 g via INTRAVENOUS
  Filled 2012-12-31: qty 100

## 2012-12-31 NOTE — Progress Notes (Signed)
Patient Name: Martin [name] Date of Encounter: 12/31/2012   Principal Problem:   Hypotension Active Problems:   Chronic atrial fibrillation   Chronic systolic heart failure   CAD (coronary artery disease)   Long-term (current) use of anticoagulants   Shock circulatory   SUBJECTIVE  No CP or SOB, mild pain at the right hip.  CURRENT MEDS . atorvastatin  40 mg Oral q1800  . docusate sodium  100 mg Oral BID  . ferrous sulfate  325 mg Oral Q breakfast  . furosemide  20 mg Intravenous Q8H  . insulin aspart  0-15 Units Subcutaneous Q4H  . multivitamin with minerals  1 tablet Oral Daily  . potassium chloride SA  20 mEq Oral Daily  . vitamin C  500 mg Oral Daily    OBJECTIVE  Filed Vitals:   12/31/12 0900 12/31/12 0930 12/31/12 1001 12/31/12 1030  BP: 105/61 115/63 111/51 103/58  Pulse: 112 129 75 40  Temp:      TempSrc:      Resp: 11 16 21 31   Height:      Weight:      SpO2: 100% 98% 100% 97%    Intake/Output Summary (Last 24 hours) at 12/31/12 1043 Last data filed at 12/31/12 1000  Gross per 24 hour  Intake 1769.34 ml  Output   1035 ml  Net 734.34 ml   Filed Weights   12/29/12 1432  Weight: 212 lb 15.4 oz (96.6 kg)    PHYSICAL EXAM  General: Pleasant, NAD. Neuro: Alert and oriented X 3. Moves all extremities spontaneously. Psych: Normal affect. HEENT:  Normal  Neck: Supple without bruits or JVD. Lungs:  Resp regular and unlabored, rales at both basis Heart: RRR no s3, s4, or murmurs. Abdomen: Soft, non-tender, non-distended, BS + x 4.  Extremities: No clubbing, cyanosis, mild edema at the ankles. DP/PT/Radials 2+ and equal bilaterally.  Accessory Clinical Findings  CBC  Recent Labs  12/30/12 1542 12/31/12 0358  WBC 12.9* 14.1*  NEUTROABS  --  12.7*  HGB 8.5* 8.5*  HCT 24.4* 24.6*  MCV 94.9 95.3  PLT 57* 54*   Basic Metabolic Panel  Recent Labs  12/30/12 0300 12/31/12 0358  NA 140 136  K 3.8 3.4*  CL 102 99  CO2 26 26  GLUCOSE  191* 136*  BUN 26* 31*  CREATININE 1.76* 2.03*  CALCIUM 7.5* 8.0*  MG  --  1.8  PHOS  --  3.5   Cardiac Enzymes  Recent Labs  12/29/12 1215 12/29/12 1542 12/30/12  TROPONINI <0.30 <0.30 <0.30   TELE  A fib with RVR, HR 100-130  Radiology/Studies  Dg Chest Port 1 View 12/30/2012    IMPRESSION: 1. Left basilar opacity is likely atelectasis. No convincing pneumonia and no evidence of pulmonary edema. 2. No change from the previous day's study.      ASSESSMENT AND PLAN  1. Postoperative hypotension likely due to volume loss requiring support with neosynephrine and vasopressin, off vasopressin since yesterday, off neo since this am. hemodynamically stable, BP 105/60.   2. CHF - after fluid replacement for hypotension, positive 3 L in the last 48 H, worsening Crea after lasix yesterday, we will hold for now.   3. Coronary artery disease with previous ischemic cardiomyopathy and low ejection fraction   4. Chronic atrial fibrillation, now with RVR due to the pressors and pain, add metoprolol 12.5 mg po bid  5. Long-term anticoagulation with Eliquis, ortho ok with restarting  6. Hyperlipidemia - on lipitor 40 mg QHS  Signed, Maeson Lourenco, H

## 2012-12-31 NOTE — Progress Notes (Signed)
Physical Therapy Treatment Patient Details Name: Martin [name] MRN: [id number] DOB: Oct 07, 1930 Today's Date: 12/31/2012 Time: 4098-1191 PT Time Calculation (min): 29 min  PT Assessment / Plan / Recommendation  History of Present Illness Pt underwent posterior rt THA.  Pt with post-op hypotension requiring pressors and ICU monitoring.   PT Comments   Pt making steady progress.  Follow Up Recommendations  Home health PT;Supervision/Assistance - 24 hour (if pt here for next several days.  If dc in next 48 hours may need ST-SNF.)     Does the patient have the potential to tolerate intense rehabilitation     Barriers to Discharge        Equipment Recommendations  None recommended by PT    Recommendations for Other Services    Frequency 7X/week   Progress towards PT Goals Progress towards PT goals: Progressing toward goals  Plan Current plan remains appropriate    Precautions / Restrictions Precautions Precautions: Posterior Hip;Fall Required Braces or Orthoses: Knee Immobilizer - Right Knee Immobilizer - Right: Other (comment) (as needed for THA precautions) Restrictions RLE Weight Bearing: Weight bearing as tolerated   Pertinent Vitals/Pain HR 120's with amb.      Mobility  Bed Mobility Supine to Sit: 3: Mod assist;HOB elevated Sitting - Scoot to Edge of Bed: 4: Min assist Details for Bed Mobility Assistance: Verbal cues for technique and assist to bring lt leg off bed and trunk up. Transfers Sit to Stand: 3: Mod assist;With upper extremity assist;From bed;From elevated surface Stand to Sit: 4: Min assist;With armrests;To chair/3-in-1 Details for Transfer Assistance: Verbal cues for hand placement and assist to bring hips up. Ambulation/Gait Ambulation/Gait Assistance: 4: Min assist Ambulation Distance (Feet): 50 Feet Assistive device: Rolling walker Ambulation/Gait Assistance Details: Verbal cues for initial steps. Gait Pattern: Step-to pattern;Decreased step length -  left;Decreased stance time - right;Antalgic Gait velocity: slow    Exercises     PT Diagnosis:    PT Problem List:   PT Treatment Interventions:     PT Goals (current goals can now be found in the care plan section)    Visit Information  Last PT Received On: 12/31/12 Assistance Needed: +2 (for lines) History of Present Illness: Pt underwent posterior rt THA.  Pt with post-op hypotension requiring pressors and ICU monitoring.    Subjective Data      Cognition  Cognition Arousal/Alertness: Awake/alert Behavior During Therapy: WFL for tasks assessed/performed Overall Cognitive Status: Within Functional Limits for tasks assessed    Balance  Static Standing Balance Static Standing - Balance Support: Bilateral upper extremity supported;No upper extremity supported Static Standing - Level of Assistance: 4: Min assist  End of Session PT - End of Session Equipment Utilized During Treatment: Gait belt;Oxygen Activity Tolerance: Patient limited by fatigue Patient left: in chair;with call bell/phone within reach Nurse Communication: Mobility status   GP     [facility] 12/31/2012, 5:05 PM  [facility] PT [phone]

## 2012-12-31 NOTE — Progress Notes (Signed)
Patient ID: Martin [name], male   DOB: 1931/03/03, 79 y.o.   MRN: [id number]  PROGRESS NOTE  Subjective:  negative for Chest Pain  negative for Shortness of Breath  positive for Nausea, negative for vomiting  negative for Calf Pain  negative for Bowel Movement   Tolerating Diet: yes         Patient reports pain as 2 on 0-10 scale.       Objective: Vital signs in last 24 hours:   Patient Vitals for the past 24 hrs:  BP Temp Temp src Pulse Resp SpO2  12/31/12 1001 111/51 mmHg - - 75 21 100 %  12/31/12 0930 115/63 mmHg - - 129 16 98 %  12/31/12 0900 105/61 mmHg - - 112 11 100 %  12/31/12 0830 118/62 mmHg - - 27 19 93 %  12/31/12 0800 107/52 mmHg - - 128 20 98 %  12/31/12 0751 - 98 F (36.7 C) Oral - - -  12/31/12 0700 104/64 mmHg - - 122 19 99 %  12/31/12 0630 114/55 mmHg - - 118 28 99 %  12/31/12 0600 104/50 mmHg - - 116 19 97 %  12/31/12 0530 96/52 mmHg - - 121 19 97 %  12/31/12 0500 102/66 mmHg - - 114 15 98 %  12/31/12 0430 109/69 mmHg - - 122 21 98 %  12/31/12 0400 96/56 mmHg - - 136 21 99 %  12/31/12 0330 110/56 mmHg - - 118 22 99 %  12/31/12 0300 121/89 mmHg - - 106 16 94 %  12/31/12 0230 103/57 mmHg - - 111 20 100 %  12/31/12 0200 105/63 mmHg - - 123 15 98 %  12/31/12 0130 99/56 mmHg - - 109 22 100 %  12/31/12 0100 81/49 mmHg - - 100 19 99 %  12/31/12 0030 93/53 mmHg - - 131 21 98 %  12/31/12 0015 - - - 136 23 99 %  12/31/12 0000 101/56 mmHg 97.9 F (36.6 C) Oral 111 21 100 %  12/30/12 2345 92/52 mmHg - - 118 15 98 %  12/30/12 2330 97/57 mmHg - - 135 24 99 %  12/30/12 2315 91/45 mmHg - - 121 20 97 %  12/30/12 2300 75/42 mmHg - - 90 22 99 %  12/30/12 2245 79/55 mmHg - - 107 23 99 %  12/30/12 2230 81/50 mmHg - - 113 11 98 %  12/30/12 2215 93/55 mmHg - - 123 21 99 %  12/30/12 2200 90/51 mmHg - - 106 21 100 %  12/30/12 2145 92/52 mmHg - - 103 21 100 %  12/30/12 2130 85/41 mmHg - - 105 19 100 %  12/30/12 2115 87/45 mmHg - - 104 20 100 %  12/30/12 2100 72/41 mmHg -  - 123 20 96 %  12/30/12 2030 72/46 mmHg - - 118 22 97 %  12/30/12 2000 87/53 mmHg 98.3 F (36.8 C) Oral 125 19 96 %  12/30/12 1930 119/99 mmHg - - 36 17 86 %  12/30/12 1915 130/96 mmHg - - - - -  12/30/12 1900 83/41 mmHg - - 119 12 95 %  12/30/12 1800 91/51 mmHg - - 114 18 94 %  12/30/12 1730 101/54 mmHg - - 135 24 92 %  12/30/12 1700 93/54 mmHg - - 110 21 100 %  12/30/12 1645 104/59 mmHg - - 114 16 96 %  12/30/12 1630 104/67 mmHg - - 116 12 97 %  12/30/12 1615 95/61 mmHg - - 122 18 98 %  12/30/12 1600 93/54 mmHg - - 117 22 96 %  12/30/12 1552 - 97.7 F (36.5 C) Oral - - -  12/30/12 1545 92/54 mmHg - - 108 18 97 %  12/30/12 1530 103/64 mmHg - - 137 20 97 %  12/30/12 1521 - - - - 20 97 %  12/30/12 1515 103/64 mmHg - - 112 22 98 %  12/30/12 1500 98/42 mmHg - - 112 16 97 %  12/30/12 1358 118/62 mmHg - - 121 22 96 %  12/30/12 1300 75/52 mmHg - - 119 24 93 %  12/30/12 1214 - 98 F (36.7 C) Oral - - -  12/30/12 1140 - - - - 20 94 %  12/30/12 1100 105/69 mmHg - - 109 23 98 %      Intake/Output from previous day:   10/11 0701 - 10/12 0700 In: 1538.3 [P.O.:720; I.V.:818.3] Out: 1085 [Urine:1085]   Intake/Output this shift:   10/12 0701 - 10/12 1900 In: 300 [P.O.:240; I.V.:60] Out: 150 [Urine:150]   Intake/Output     10/11 0701 - 10/12 0700 10/12 0701 - 10/13 0700   P.O. 720 240   I.V. (mL/kg) 818.3 (8.5) 60 (0.6)   Blood     IV Piggyback     Total Intake(mL/kg) 1538.3 (15.9) 300 (3.1)   Urine (mL/kg/hr) 1085 (0.5) 150 (0.4)   Blood     Total Output 1085 150   Net +453.3 +150           LABORATORY DATA:  Recent Labs  12/29/12 1330 12/29/12 1500 12/29/12 2200 12/30/12 0300 12/30/12 0940 12/30/12 1542 12/31/12 0358  WBC  --  11.3* 13.0* 17.4* 15.4* 12.9* 14.1*  HGB 9.2* 10.0* 9.3* 8.8* 8.7* 8.5* 8.5*  HCT 27.0* 29.3* 27.8* 25.3* 24.8* 24.4* 24.6*  PLT  --  84* 79* 89* 75* 57* 54*    Recent Labs  12/29/12 0857 12/29/12 0928 12/29/12 1021 12/29/12 1120  12/29/12 1330 12/29/12 1500 12/30/12 0300 12/31/12 0358  NA 142 141 141 143 143 140 140 136  K 3.7 3.7 4.1 3.6 3.7 3.7 3.8 3.4*  CL  --   --   --   --   --  102 102 99  CO2  --   --   --   --   --  27 26 26   BUN  --   --   --   --   --  26* 26* 31*  CREATININE  --   --   --   --   --  1.62* 1.76* 2.03*  GLUCOSE 148* 165* 210* 197*  --  142* 191* 136*  CALCIUM  --   --   --   --   --  8.1* 7.5* 8.0*   Lab Results  Component Value Date   INR 1.42 12/29/2012   INR 1.57* 12/21/2012   INR 2.6* 11/22/2007    Examination:  General appearance: alert, cooperative and no distress, dressing changed  Wound Exam: clean, dry, intact   Drainage:  None: wound tissue dry  Motor Exam: grossly intact bilateral LE  Sensory Exam: grossly intact bilateral LE  Vascular Exam: Normal  Assessment:    2 Days Post-Op  Procedure(s) (LRB): TOTAL HIP ARTHROPLASTY WITH AUTOGRAFT (Right)  ADDITIONAL DIAGNOSIS:  Principal Problem:   Hypotension Active Problems:   Chronic atrial fibrillation   Chronic systolic heart failure   CAD (coronary artery disease)   Long-term (current) use of anticoagulants   Shock circulatory    Plan:  Physical Therapy as ordered Weight Bearing as Tolerated (WBAT)  DVT Prophylaxis:  Restart Eliquis today  DISCHARGE PLAN: Home with HH when medically stable  DISCHARGE NEEDS: HHPT, DME rec per PT/OT         Martin [name] 12/31/2012, 10:32 AM

## 2012-12-31 NOTE — Progress Notes (Signed)
[facility] ADULT ICU REPLACEMENT PROTOCOL FOR AM LAB REPLACEMENT ONLY  The patient does not apply for the [facility] Adult ICU Electrolyte Replacment Protocol based on the criteria listed below:   1. Is GFR >/= 40 ml/min? no  Patient's GFR today is 29 2. Is urine output >/= 0.5 ml/kg/hr for the last 6 hours? no Patient's UOP is  ml/kg/hr 3. Is BUN < 60 mg/dL? yes  Patient's BUN today is 17 4. Abnormal electrolyte(s):K 3.4, M 1.8 5. Ordered repletion with: NA 6. If a panic level lab has been reported, has the CCM MD in charge been notified? no.   Physician:    Markus Daft A 12/31/2012 5:22 AM

## 2012-12-31 NOTE — Progress Notes (Signed)
ANTICOAGULATION CONSULT NOTE - Initial Consult  Pharmacy Consult for Apixaban Indication: atrial fibrillation  No Known Allergies  Patient Measurements: Height: 5\' 8"  (172.7 cm) Weight: 212 lb 15.4 oz (96.6 kg) IBW/kg (Calculated) : 68.4  Labs:  Recent Labs  12/29/12 1215  12/29/12 1500 12/29/12 1525 12/29/12 1542  12/30/12 12/30/12 0300 12/30/12 0940 12/30/12 1542 12/31/12 0358  HGB  --   < > 10.0*  --   --   < >  --  8.8* 8.7* 8.5* 8.5*  HCT  --   < > 29.3*  --   --   < >  --  25.3* 24.8* 24.4* 24.6*  PLT  --   --  84*  --   --   < >  --  89* 75* 57* 54*  APTT  --   --   --  29  --   --   --   --   --   --   --   LABPROT  --   --   --  17.0*  --   --   --   --   --   --   --   INR  --   --   --  1.42  --   --   --   --   --   --   --   CREATININE  --   --  1.62*  --   --   --   --  1.76*  --   --  2.03*  TROPONINI <0.30  --   --   --  <0.30  --  <0.30  --   --   --   --   < > = values in this interval not displayed.  Estimated Creatinine Clearance: 32.7 ml/min (by C-G formula based on Cr of 2.03).   Medical History: Past Medical History  Diagnosis Date  . Gout   . Hypertension   . Osteoarthritis   . Cardiomyopathy     ischemic  . Coronary artery disease   . Old anterior wall myocardial infarction   . 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 Atrial fibrillation   . Automatic implantable cardioverter-defibrillator in situ     dr Ladona Ridgel   Assessment: 79 year old male 2 days post -op hip arthroplasty.  He was on apixaban PTA for Afib at a dose of 5 mg po BID.  Now with renal insufficiency - Scr of 2.03 (>1.5).  He now meets 2 of the 3 criteria for reduced dose apixaban  Goal of Therapy:  Appropriate dosing Monitor platelets by anticoagulation protocol: Yes   Plan:  1) Apixaban 2.5 mg po BID for now 2) Follow up for improvement in renal function  Thank you. Okey Regal, PharmD [phone]  12/31/2012,11:32 AM

## 2012-12-31 NOTE — Progress Notes (Signed)
Physical Therapy Treatment Patient Details Name: Martin [name] MRN: [id number] DOB: March 05, 1931 Today's Date: 12/31/2012 Time: 4098-1191 PT Time Calculation (min): 24 min  PT Assessment / Plan / Recommendation  History of Present Illness Pt underwent posterior rt THA.  Pt with post-op hypotension requiring pressors and ICU monitoring.   PT Comments   Pt able to get OOB to chair this AM.  Follow Up Recommendations  Home health PT;Supervision/Assistance - 24 hour (if pt is here for several more days. If dc planned in next 48 hrs may need ST-SNF.)     Does the patient have the potential to tolerate intense rehabilitation     Barriers to Discharge        Equipment Recommendations  None recommended by PT    Recommendations for Other Services    Frequency 7X/week   Progress towards PT Goals Progress towards PT goals: Progressing toward goals  Plan Discharge plan needs to be updated    Precautions / Restrictions Precautions Precautions: Posterior Hip;Fall Required Braces or Orthoses: Knee Immobilizer - Right Knee Immobilizer - Right: Other (comment) (as needed for THA precautions) Restrictions Weight Bearing Restrictions: No RLE Weight Bearing: Weight bearing as tolerated   Pertinent Vitals/Pain See flow sheet.    Mobility  Bed Mobility Bed Mobility: Supine to Sit;Sitting - Scoot to Edge of Bed Supine to Sit: 1: +2 Total assist Supine to Sit: Patient Percentage: 30% Sitting - Scoot to Edge of Bed: 1: +2 Total assist Sitting - Scoot to Edge of Bed: Patient Percentage: 30% Details for Bed Mobility Assistance: Assist to bring leg off and bring trunk up and hips to EOB. Transfers Transfers: Sit to Stand;Stand to Sit;Stand Pivot Transfers Sit to Stand: 1: +2 Total assist;With upper extremity assist;From bed Sit to Stand: Patient Percentage: 70% Stand to Sit: 1: +2 Total assist;With upper extremity assist;With armrests;To chair/3-in-1 Stand to Sit: Patient Percentage: 70% Stand  Pivot Transfers: 1: +2 Total assist Stand Pivot Transfers: Patient Percentage: 70% Details for Transfer Assistance: Verbal cues for hand placement and assist to bring hips up.    Exercises     PT Diagnosis:    PT Problem List:   PT Treatment Interventions:     PT Goals (current goals can now be found in the care plan section)    Visit Information  Last PT Received On: 12/31/12 Assistance Needed: +2 History of Present Illness: Pt underwent posterior rt THA.  Pt with post-op hypotension requiring pressors and ICU monitoring.    Subjective Data      Cognition  Cognition Arousal/Alertness: Awake/alert Behavior During Therapy: WFL for tasks assessed/performed Overall Cognitive Status: Within Functional Limits for tasks assessed    Balance  Balance Balance Assessed: Yes Static Standing Balance Static Standing - Balance Support: Bilateral upper extremity supported;No upper extremity supported Static Standing - Level of Assistance: 5: Stand by assistance  End of Session PT - End of Session Equipment Utilized During Treatment: Gait belt Activity Tolerance: Other (comment) (High HR) Patient left: in chair;with call bell/phone within reach;with family/visitor present Nurse Communication: Mobility status   GP     Tarena Gockley 12/31/2012, 11:13 AM  [employer] PT [phone]

## 2012-12-31 NOTE — Progress Notes (Signed)
PULMONARY  / CRITICAL CARE MEDICINE  Name: Martin [name] MRN: [id number] DOB: 14-Jan-1931    ADMISSION DATE:  12/29/2012 CONSULTATION DATE:  12/29/12  REFERRING MD :  [name] PRIMARY SERVICE: PCCM  CHIEF COMPLAINT:  Post-op hypotension   BRIEF PATIENT DESCRIPTION:   79 y/o male with PMH of HTN,CHF (EF 25%) , MI, CAD s/p CABG 2010, Ischemic cardiomyopathy, chronic afib with ICD (followed by Dr. Donnie Aho), chronic thrombocytopenia, L breast cancer s/p mastectomy (1991), hx of bourbon / gin daily consumption who presented 10/10 for OA R Hip. Patient has a history of pain and functional disability in the right hip(s) due to arthritis and has failed non-surgical conservative treatments include NSAID's and/or analgesics, corticosteriod injections and activity modification. Onset of symptoms was gradual starting 5 years ago with gradually worsening course since that time. Pre-op patient reported night pain, worsening of pain with activity and weight bearing, pain that interfers with activities of daily living and pain with passive range of motion. Intra-op EBL of ~ 1L.  Patient was transfused with 2 units of PRBC, platelets and fluid boluses and was placed on dopamine. Attempts with intra-op neo that did not have significant effect on blood pressure.  Low BP of 40's systolic noted.  In PACU, patient remained hypotensive despite dopamine.  Also noted to have AFIB with RVR.  ABG wnl post case with excellent mental status.  Labs pending.  PCCM consulted 12/29/12  PCCM consulted for eval and CVL placement for hypotension.    Pt c/o nausea, feeling cold and R hip pain.  Denies chest pain, fevers, abdominal pain, constipation.    LINES / TUBES: L radial aline 10/10 >>>10/11 Foley Cath 10/10>>>10/12 (planned) R IJ TLC 10/10>>>10/12 (pplaneed)  CULTURES: None   ANTIBIOTICS: Cefazolin 10/10 (OR)>>     SIGNIFICANT EVENTS / STUDIES:  10/10 OA R Hip, post-op hypotensive event 12/30/12: feeels better.  Still on vasop and neo 115 via CVL. CVP 12. On 28% fM o2. BP is MAP 77 with sbp 110   SUBJECTIVE/OVERNIGHT/INTERVAL HX 10/12 - walking with PT. Devices of foley and cvl still presnt. Off pressoors. Feeling better.Lasix on hold due to rising creat  VITAL SIGNS: Temp:  [97.1 F (36.2 C)-98.3 F (36.8 C)] 97.8 F (36.6 C) (10/12 1539) Pulse Rate:  [27-136] 111 (10/12 1600) Resp:  [11-31] 15 (10/12 1600) BP: (72-130)/(41-99) 93/51 mmHg (10/12 1600) SpO2:  [86 %-100 %] 100 % (10/12 1600)  INTAKE / OUTPUT: Intake/Output     10/11 0701 - 10/12 0700 10/12 0701 - 10/13 0700   P.O. 720 600   I.V. (mL/kg) 818.3 (8.5) 180 (1.9)   Blood     IV Piggyback     Total Intake(mL/kg) 1538.3 (15.9) 780 (8.1)   Urine (mL/kg/hr) 1085 (0.5) 300 (0.3)   Blood     Total Output 1085 300   Net +453.3 +480          PHYSICAL EXAMINATION: General:  Looks well.On nasal cannula . Denies complinats Neuro:  A & O x4, responds appropriately. CAM-ICU negative fir delirium. Moves all 4s. [place] with PT/RN and walker HEENT:  NCAT, PERRL, no jvd Cardiovascular:  Irregularly, irregular rate, distal pulses intact Lungs:  resp's even/non-labored on [place], Clear to auscultation bilaterally  Abdomen: soft, non tender, + BS Musculoskeletal:  right hip dressing in place Skin:  Warm and dry, no edema   LABS:  PULMONARY  Recent Labs Lab 12/29/12 1330 12/29/12 1659 12/29/12 2207  PHART 7.392 7.406 7.379  PCO2ART 41.5  47.9* 43.5  PO2ART 61.0* 242.0* 88.0  HCO3 25.3* 30.0* 25.4*  TCO2 27 31 27   O2SAT 91.0 100.0 96.0    CBC  Recent Labs Lab 12/30/12 0940 12/30/12 1542 12/31/12 0358  HGB 8.7* 8.5* 8.5*  HCT 24.8* 24.4* 24.6*  WBC 15.4* 12.9* 14.1*  PLT 75* 57* 54*    COAGULATION  Recent Labs Lab 12/29/12 1525  INR 1.42    CARDIAC    Recent Labs Lab 12/29/12 1215 12/29/12 1542 12/30/12  TROPONINI <0.30 <0.30 <0.30    Recent Labs Lab 12/30/12 0300 12/31/12 0358  PROBNP 10715.0*  12411.0*     CHEMISTRY  Recent Labs Lab 12/29/12 1021 12/29/12 1120 12/29/12 1330 12/29/12 1500 12/30/12 0300 12/31/12 0358  NA 141 143 143 140 140 136  K 4.1 3.6 3.7 3.7 3.8 3.4*  CL  --   --   --  102 102 99  CO2  --   --   --  27 26 26   GLUCOSE 210* 197*  --  142* 191* 136*  BUN  --   --   --  26* 26* 31*  CREATININE  --   --   --  1.62* 1.76* 2.03*  CALCIUM  --   --   --  8.1* 7.5* 8.0*  MG  --   --   --   --   --  1.8  PHOS  --   --   --   --   --  3.5   Estimated Creatinine Clearance: 32.7 ml/min (by C-G formula based on Cr of 2.03).   LIVER  Recent Labs Lab 12/29/12 1525  INR 1.42     INFECTIOUS  Recent Labs Lab 12/29/12 1500 12/31/12 0358  LATICACIDVEN 3.3* 1.1     ENDOCRINE CBG (last 3)   Recent Labs  12/31/12 0351 12/31/12 0752 12/31/12 1540  GLUCAP 136* 125* 151*         IMAGING x48h  Dg Chest Port 1 View  12/30/2012   CLINICAL DATA:  Evaluate for airspace disease.  EXAM: PORTABLE CHEST - 1 VIEW  COMPARISON:  12/29/2012  FINDINGS: Reticular densities in noted in the left lower lung zone, likely atelectasis. There is no convincing pneumonia. No pulmonary edema.  There is no pneumothorax. Changes from cardiac surgery are stable. Mild cardiomegaly is stable.  Right internal jugular central venous line has its tip just above the caval atrial junction, also unchanged.  IMPRESSION: 1. Left basilar opacity is likely atelectasis. No convincing pneumonia and no evidence of pulmonary edema. 2. No change from the previous day's study.   Electronically Signed   By: Amie Portland M.D.   On: 12/30/2012 07:42   Dg Chest Port 1 View  12/29/2012   CLINICAL DATA:  Pulmonary edema.  EXAM: PORTABLE CHEST - 1 VIEW  COMPARISON:  Same day.  FINDINGS: Stable cardiomegaly. Sternotomy wires are noted. Left-sided pacemaker is unchanged in position. Stable central pulmonary vascular congestion. No acute pulmonary disease or edema is noted.  IMPRESSION: Stable  cardiomegaly. No definite evidence of pulmonary edema seen currently.   Electronically Signed   By: Roque Lias M.D.   On: 12/29/2012 20:31   PULMONARY  Recent Labs Lab 12/29/12 1330 12/29/12 1659 12/29/12 2207  PHART 7.392 7.406 7.379  PCO2ART 41.5 47.9* 43.5  PO2ART 61.0* 242.0* 88.0  HCO3 25.3* 30.0* 25.4*  TCO2 27 31 27   O2SAT 91.0 100.0 96.0    CBC  Recent Labs Lab 12/30/12 0940 12/30/12 1542 12/31/12  0358  HGB 8.7* 8.5* 8.5*  HCT 24.8* 24.4* 24.6*  WBC 15.4* 12.9* 14.1*  PLT 75* 57* 54*    COAGULATION  Recent Labs Lab 12/29/12 1525  INR 1.42    CARDIAC    Recent Labs Lab 12/29/12 1215 12/29/12 1542 12/30/12  TROPONINI <0.30 <0.30 <0.30    Recent Labs Lab 12/30/12 0300 12/31/12 0358  PROBNP 10715.0* 12411.0*     CHEMISTRY  Recent Labs Lab 12/29/12 1021 12/29/12 1120 12/29/12 1330 12/29/12 1500 12/30/12 0300 12/31/12 0358  NA 141 143 143 140 140 136  K 4.1 3.6 3.7 3.7 3.8 3.4*  CL  --   --   --  102 102 99  CO2  --   --   --  27 26 26   GLUCOSE 210* 197*  --  142* 191* 136*  BUN  --   --   --  26* 26* 31*  CREATININE  --   --   --  1.62* 1.76* 2.03*  CALCIUM  --   --   --  8.1* 7.5* 8.0*  MG  --   --   --   --   --  1.8  PHOS  --   --   --   --   --  3.5   Estimated Creatinine Clearance: 32.7 ml/min (by C-G formula based on Cr of 2.03).   LIVER  Recent Labs Lab 12/29/12 1525  INR 1.42     INFECTIOUS  Recent Labs Lab 12/29/12 1500 12/31/12 0358  LATICACIDVEN 3.3* 1.1     ENDOCRINE CBG (last 3)   Recent Labs  12/31/12 0351 12/31/12 0752 12/31/12 1540  GLUCAP 136* 125* 151*         IMAGING x48h  Dg Chest Port 1 View  12/30/2012   CLINICAL DATA:  Evaluate for airspace disease.  EXAM: PORTABLE CHEST - 1 VIEW  COMPARISON:  12/29/2012  FINDINGS: Reticular densities in noted in the left lower lung zone, likely atelectasis. There is no convincing pneumonia. No pulmonary edema.  There is no pneumothorax.  Changes from cardiac surgery are stable. Mild cardiomegaly is stable.  Right internal jugular central venous line has its tip just above the caval atrial junction, also unchanged.  IMPRESSION: 1. Left basilar opacity is likely atelectasis. No convincing pneumonia and no evidence of pulmonary edema. 2. No change from the previous day's study.   Electronically Signed   By: Amie Portland M.D.   On: 12/30/2012 07:42   Dg Chest Port 1 View  12/29/2012   CLINICAL DATA:  Pulmonary edema.  EXAM: PORTABLE CHEST - 1 VIEW  COMPARISON:  Same day.  FINDINGS: Stable cardiomegaly. Sternotomy wires are noted. Left-sided pacemaker is unchanged in position. Stable central pulmonary vascular congestion. No acute pulmonary disease or edema is noted.  IMPRESSION: Stable cardiomegaly. No definite evidence of pulmonary edema seen currently.   Electronically Signed   By: Roque Lias M.D.   On: 12/29/2012 20:31       ASSESSMENT / PLAN: PULMONARY A: At Risk Atlectasis - in setting of post-op hip arthroplasty High risk edema  12/30/12: on face mask o2. Normal WOB 12/31/12: Normal WOB. On nasal cannula  P:   - nasal cannula - Sp02 > 92% - - IS as able   CARDIOVASCULAR A:  Chronic afib with post op RVR - in setting of post operative administration of Dopamine + volume loss  CHF - (EF 25% 2009) pt has ICD CAD - ischemic cardiomyopathy, s/p CABG  2010.  Initial post op troponin neg Hypotension - post operative likely hypovolemic, r/o cardiogenic source  Abnormal EKG- RBBB, afib, left axis dev  12/30/12: Improved pressor needs 12/31/12 - off pressors  P:  - cards folliowing - back on loprssor, lasix on hold - mgmt per cards -await echo - el;iquis at low dose per cards (watch platelets and creat on this)  RENAL A:   At risk AKI - in setting of volume loss, hypotension.  Hx of sr cr 1.79 in 2009  12/31/12 - net 4.4L since admit 12/29/2012 but lasix on hold due to rising creat after lasix yesterday   P:    - saline lock -monitor creat   GASTROINTESTINAL A:   Nausea / Vomiting  12/30/12 and 12/31/12: No nausea/vomot  P:   - - continue cardiac diet when able  HEMATOLOGIC A:   Chronic Thrombocytopenia - followed by Dr. Cyndie Chime; thought r/t previous chemo agents for male breast CA + ETOH.  Likely mild myelodysplastic syndrome Acute blood loss anemia - intraoperative loss of 1000cc coagulapthy  10/12/4: Stable. Back on low dose eliquis due to renal insuff  P:  - monitor CBC; PRBC onoy for hgb < 7gm% (no service to tx blood wihtout talking to CCM) - eliquis per ortho/cards  INFECTIOUS A:   Post Op R Hip Arthroplasty P:   - cbc and fever curve - abx as above  ENDOCRINE A:   No acute issues P:    -monitor CBGs   NEUROLOGIC A:   Post operative pain  Hx of Daily ETOH  12/31/12: Pain +/ Rx with vicodin . AMbulating with PT P:   - PT to continue - dc fentanyl (drops BP anyways)  GLOBAL 12/30/12 and 12/31/12: No family at bedside. Can move to floor 01/01/13/ PCCM will sign off      Dr. Kalman Shan, M.D., [facility].C.P Pulmonary and Critical Care Medicine Staff Physician [facility] [place] [facility] Pager: [phone], If no answer or between  15:00h - 7:00h: call [phone]  12/31/2012 4:38 PM

## 2013-01-01 DIAGNOSIS — I059 Rheumatic mitral valve disease, unspecified: Secondary | ICD-10-CM

## 2013-01-01 LAB — GLUCOSE, CAPILLARY
Glucose-Capillary: 138 mg/dL — ABNORMAL HIGH (ref 70–99)
Glucose-Capillary: 113 mg/dL — ABNORMAL HIGH (ref 70–99)

## 2013-01-01 LAB — BASIC METABOLIC PANEL
BUN: 35 mg/dL — ABNORMAL HIGH (ref 6–23)
CO2: 27 mEq/L (ref 19–32)
Chloride: 99 mEq/L (ref 96–112)
GFR calc Af Amer: 35 mL/min — ABNORMAL LOW (ref >90)
Glucose, Bld: 136 mg/dL — ABNORMAL HIGH (ref 70–99)
Potassium: 3.7 mEq/L (ref 3.5–5.1)
Sodium: 135 mEq/L (ref 135–145)

## 2013-01-01 LAB — APTT: aPTT: 38 seconds — ABNORMAL HIGH (ref 24–37)

## 2013-01-01 LAB — CBC WITH DIFFERENTIAL/PLATELET
Eosinophils Absolute: 0.1 10*3/uL (ref 0.0–0.7)
Lymphocytes Relative: 4 % — ABNORMAL LOW (ref 12–46)
Lymphs Abs: 0.4 10*3/uL — ABNORMAL LOW (ref 0.7–4.0)
MCHC: 34.3 g/dL (ref 30.0–36.0)
Monocytes Absolute: 0.7 10*3/uL (ref 0.1–1.0)
Neutro Abs: 9.1 10*3/uL — ABNORMAL HIGH (ref 1.7–7.7)
Neutrophils Relative %: 88 % — ABNORMAL HIGH (ref 43–77)
Platelets: 50 10*3/uL — ABNORMAL LOW (ref 150–400)
RBC: 2.41 MIL/uL — ABNORMAL LOW (ref 4.22–5.81)
WBC: 10.4 10*3/uL (ref 4.0–10.5)

## 2013-01-01 LAB — MAGNESIUM: Magnesium: 2.2 mg/dL (ref 1.5–2.5)

## 2013-01-01 LAB — PHOSPHORUS: Phosphorus: 3.4 mg/dL (ref 2.3–4.6)

## 2013-01-01 LAB — PROTIME-INR
INR: 1.64 — ABNORMAL HIGH (ref 0.00–1.49)
Prothrombin Time: 19 seconds — ABNORMAL HIGH (ref 11.6–15.2)

## 2013-01-01 MED ORDER — PERFLUTREN LIPID MICROSPHERE
1.0000 mL | INTRAVENOUS | Status: AC | PRN
  Administered 2013-01-01: 2 mL via INTRAVENOUS
  Filled 2013-01-01 (×3): qty 10

## 2013-01-01 NOTE — Progress Notes (Signed)
Physical Therapy Treatment Patient Details Name: Martin [name] MRN: [id number] DOB: [birth year] Today's Date: 01/01/2013 Time: 4098-1191 PT Time Calculation (min): 11 min  PT Assessment / Plan / Recommendation  History of Present Illness Pt underwent posterior rt THA.  Pt with post-op hypotension requiring pressors and ICU monitoring.   PT Comments   Patient requesting exercises this pm.  Completed with bil. LE's.  Follow Up Recommendations  Home health PT;Supervision/Assistance - 24 hour     Does the patient have the potential to tolerate intense rehabilitation     Barriers to Discharge        Equipment Recommendations  None recommended by PT    Recommendations for Other Services    Frequency 7X/week   Progress towards PT Goals Progress towards PT goals: Progressing toward goals  Plan Current plan remains appropriate    Precautions / Restrictions Precautions Precautions: Posterior Hip;Fall Precaution Comments: pt verbalized 2/3 hip precautions.  Required Braces or Orthoses: Knee Immobilizer - Right Knee Immobilizer - Right: Other (comment) (as needed for THA precautions) Restrictions Weight Bearing Restrictions: Yes RLE Weight Bearing: Weight bearing as tolerated   Pertinent Vitals/Pain     Mobility     Exercises Total Joint Exercises Ankle Circles/Pumps: AROM;Both;10 reps;Seated Quad Sets: AROM;Both;10 reps;Seated Short Arc Quad: AROM;Both;10 reps;Seated Heel Slides: AROM;Left;AAROM;Right;10 reps;Seated Hip ABduction/ADduction: AROM;Left;AAROM;Right;10 reps;Seated     PT Goals (current goals can now be found in the care plan section) Acute Rehab PT Goals Patient Stated Goal: return home  Visit Information  Last PT Received On: 01/01/13 Assistance Needed: +1 History of Present Illness: Pt underwent posterior rt THA.  Pt with post-op hypotension requiring pressors and ICU monitoring.    Subjective Data  Patient Stated Goal: return home   Cognition  Cognition Arousal/Alertness: Awake/alert Behavior During Therapy: WFL for tasks assessed/performed Overall Cognitive Status: Within Functional Limits for tasks assessed    Balance     End of Session PT - End of Session Equipment Utilized During Treatment: Oxygen Activity Tolerance: Patient limited by fatigue Patient left: in chair;with call bell/phone within reach   GP     Vena Austria 01/01/2013, 4:37 PM Durenda Hurt. Renaldo Fiddler, [facility] Acute Rehab Services Pager [phone]

## 2013-01-01 NOTE — Evaluation (Signed)
Occupational Therapy Evaluation Patient Details Name: Martin [name] MRN: [id number] DOB: 1931/01/19 Today's Date: 01/01/2013 Time: 4098-1191 OT Time Calculation (min): 35 min  OT Assessment / Plan / Recommendation History of present illness Pt underwent posterior rt THA.  Pt with post-op hypotension requiring pressors and ICU monitoring.   Clinical Impression   Pt is s/p THA resulting in the deficits listed below (see OT Problem List). OT sats decreased to 87% on RA, but rapidly improved to 94% on 2L 02.  Pt will benefit from skilled OT to increase their safety and independence with ADL and functional mobility for ADL to facilitate discharge to venue listed below. Pt plans to discharge home with wife      OT Assessment  Patient needs continued OT Services    Follow Up Recommendations  No OT follow up;Supervision/Assistance - 24 hour    Barriers to Discharge      Equipment Recommendations  None recommended by OT    Recommendations for Other Services    Frequency  Min 2X/week    Precautions / Restrictions Precautions Precautions: Posterior Hip;Fall Precaution Comments: pt verbalized 2/3 hip precautions.  Required Braces or Orthoses: Knee Immobilizer - Right Knee Immobilizer - Right: Other (comment) (as needed for THA precautions) Restrictions Weight Bearing Restrictions: Yes RLE Weight Bearing: Weight bearing as tolerated   Pertinent Vitals/Pain     ADL  Eating/Feeding: Independent Where Assessed - Eating/Feeding: Chair Grooming: Wash/dry hands;Wash/dry face;Teeth care;Set up Where Assessed - Grooming: Supported sitting Upper Body Bathing: Set up;Supervision/safety Where Assessed - Upper Body Bathing: Supported sitting Lower Body Bathing: Moderate assistance Where Assessed - Lower Body Bathing: Supported sit to stand Upper Body Dressing: Set up;Supervision/safety Where Assessed - Upper Body Dressing: Supported sitting;Unsupported sitting Lower Body Dressing: Maximal  assistance Where Assessed - Lower Body Dressing: Supported sit to stand Toilet Transfer: Minimal assistance Toilet Transfer Method: Sit to stand;Stand pivot Acupuncturist: Bedside commode Toileting - Clothing Manipulation and Hygiene: Moderate assistance Where Assessed - Toileting Clothing Manipulation and Hygiene: Standing Equipment Used: Rolling walker Transfers/Ambulation Related to ADLs: min A for bed to chair transfer ADL Comments: Pt instructed in use of AE for LB ADLs.  Has a reacher at home     OT Diagnosis: Generalized weakness;Acute pain  OT Problem List: Decreased strength;Decreased activity tolerance;Decreased knowledge of use of DME or AE;Decreased knowledge of precautions;Pain OT Treatment Interventions: Self-care/ADL training;DME and/or AE instruction;Therapeutic activities;Patient/family education   OT Goals(Current goals can be found in the care plan section) Acute Rehab OT Goals Patient Stated Goal: return home OT Goal Formulation: With patient/family Time For Goal Achievement: 01/08/13 Potential to Achieve Goals: Good ADL Goals Pt Will Perform Grooming: with supervision;standing Pt Will Perform Lower Body Bathing: with supervision;with adaptive equipment;sit to/from stand Pt Will Perform Lower Body Dressing: with supervision;with adaptive equipment;sit to/from stand Pt Will Transfer to Toilet: with supervision;ambulating;bedside commode Pt Will Perform Toileting - Clothing Manipulation and hygiene: with supervision;sit to/from stand Pt Will Perform Tub/Shower Transfer: Shower transfer;with supervision;rolling walker;3 in 1  Visit Information  Last OT Received On: 01/01/13 Assistance Needed: +1 History of Present Illness: Pt underwent posterior rt THA.  Pt with post-op hypotension requiring pressors and ICU monitoring.       Prior Functioning     Home Living Family/patient expects to be discharged to:: Private residence Living Arrangements:  Spouse/significant other Available Help at Discharge: Family;Available 24 hours/day Type of Home: House Home Access: Stairs to enter [employer] of Steps: 6 Entrance Stairs-Rails: None Home  Layout: Multi-level;Able to live on main level with bedroom/bathroom Home Equipment: Dan Humphreys - 2 wheels;Cane - single point;Bedside commode;Toilet riser;Adaptive equipment Adaptive Equipment: Reacher Prior Function Level of Independence: Independent Communication Communication: HOH         Vision/Perception     Cognition  Cognition Arousal/Alertness: Awake/alert Behavior During Therapy: WFL for tasks assessed/performed Overall Cognitive Status: Within Functional Limits for tasks assessed    Extremity/Trunk Assessment Upper Extremity Assessment Upper Extremity Assessment: Overall WFL for tasks assessed Lower Extremity Assessment Lower Extremity Assessment: Defer to PT evaluation     Mobility Bed Mobility Bed Mobility: Supine to Sit;Sitting - Scoot to Edge of Bed Supine to Sit: 3: Mod assist;HOB elevated;With rails Sitting - Scoot to Edge of Bed: 4: Min assist Details for Bed Mobility Assistance: verbal cues for technique, assist to move Rt. LE off bed and assist to lift trunk  Transfers Transfers: Sit to Stand;Stand to Sit Sit to Stand: 4: Min assist;With upper extremity assist;From bed Stand to Sit: 4: Min assist;With upper extremity assist;To chair/3-in-1 Details for Transfer Assistance: verbal cues for hand placement and and proper technique     Exercise     Balance     End of Session OT - End of Session Equipment Utilized During Treatment: Rolling walker Activity Tolerance: Patient tolerated treatment well Patient left: in chair;with call bell/phone within reach;with family/visitor present Nurse Communication: Mobility status  GO     Lavar Rosenzweig M 01/01/2013, 4:14 PM

## 2013-01-01 NOTE — Progress Notes (Signed)
Physical Therapy Treatment Patient Details Name: Martin [name] MRN: [id number] DOB: 1930/10/24 Today's Date: 01/01/2013 Time: 4098-1191 PT Time Calculation (min): 24 min  PT Assessment / Plan / Recommendation  History of Present Illness Pt underwent posterior rt THA.  Pt with post-op hypotension requiring pressors and ICU monitoring.   PT Comments   Pt agreeable to mobility.  Pt's HR increased to 156 non sustained during mobility.  RN aware.  Will continue to follow.    Follow Up Recommendations  Home health PT;Supervision/Assistance - 24 hour (if pt here for next several days.  If dc in next 48 hours may need ST-SNF.)     Does the patient have the potential to tolerate intense rehabilitation     Barriers to Discharge        Equipment Recommendations  None recommended by PT    Recommendations for Other Services    Frequency 7X/week   Progress towards PT Goals Progress towards PT goals: Progressing toward goals  Plan Current plan remains appropriate    Precautions / Restrictions Precautions Precautions: Posterior Hip;Fall Precaution Comments: pt verbalized 2/3 hip precautions.  Required Braces or Orthoses: Knee Immobilizer - Right Knee Immobilizer - Right: Other (comment) (as needed for THA precautions) Restrictions Weight Bearing Restrictions: Yes RLE Weight Bearing: Weight bearing as tolerated   Pertinent Vitals/Pain Indicates R hip is stiff, but states he had pain meds earlier.      Mobility  Bed Mobility Bed Mobility: Not assessed Transfers Transfers: Sit to Stand;Stand to Sit Sit to Stand: 3: Mod assist;With upper extremity assist;From chair/3-in-1;With armrests Stand to Sit: 4: Min assist;With upper extremity assist;To chair/3-in-1;With armrests Details for Transfer Assistance: cues to get closer to chair prior to sitting and controlling descent.  Heavy ModA for bringing hips up to stand.   Ambulation/Gait Ambulation/Gait Assistance: 4: Min assist Ambulation  Distance (Feet): 50 Feet Assistive device: Rolling walker Ambulation/Gait Assistance Details: cues for upright posture and hip precautions with turns.   Gait Pattern: Step-to pattern;Decreased step length - left;Decreased stance time - right;Antalgic Stairs: No Wheelchair Mobility Wheelchair Mobility: No    Exercises Total Joint Exercises Ankle Circles/Pumps: AROM;Right;10 reps Long Arc Quad: AROM;Right;10 reps   PT Diagnosis:    PT Problem List:   PT Treatment Interventions:     PT Goals (current goals can now be found in the care plan section) Acute Rehab PT Goals Time For Goal Achievement: 01/06/13 Potential to Achieve Goals: Good  Visit Information  Last PT Received On: 01/01/13 Assistance Needed: +1 (2nd person would be helpful fr lines.  ) History of Present Illness: Pt underwent posterior rt THA.  Pt with post-op hypotension requiring pressors and ICU monitoring.    Subjective Data      Cognition  Cognition Arousal/Alertness: Awake/alert Behavior During Therapy: WFL for tasks assessed/performed Overall Cognitive Status: Within Functional Limits for tasks assessed    Balance  Balance Balance Assessed: Yes Static Standing Balance Static Standing - Balance Support: Bilateral upper extremity supported;No upper extremity supported Static Standing - Level of Assistance: 4: Min assist  End of Session PT - End of Session Equipment Utilized During Treatment: Gait belt;Oxygen Activity Tolerance: Patient limited by fatigue Patient left: in chair;with call bell/phone within reach Nurse Communication: Mobility status   GP     Sunny Schlein, [place] [phone] 01/01/2013, 10:19 AM

## 2013-01-01 NOTE — Progress Notes (Signed)
  Echocardiogram 2D Echocardiogram with Definity has been performed.  Cathie Beams 01/01/2013, 12:42 PM

## 2013-01-01 NOTE — Progress Notes (Signed)
Subjective: 3 Days Post-Op Procedure(s) (LRB): TOTAL HIP ARTHROPLASTY WITH AUTOGRAFT (Right) Patient reports pain as mild and moderate.    Objective: Vital signs in last 24 hours: Temp:  [97.8 F (36.6 C)-98.4 F (36.9 C)] 98.2 F (36.8 C) (10/13 1315) Pulse Rate:  [53-118] 118 (10/13 1315) Resp:  [15-29] 22 (10/13 1315) BP: (87-117)/(49-79) 117/68 mmHg (10/13 1300) SpO2:  [62 %-100 %] 99 % (10/13 1315) Weight:  [99.1 kg (218 lb 7.6 oz)] 99.1 kg (218 lb 7.6 oz) (10/13 0500)  Intake/Output from previous day: 10/12 0701 - 10/13 0700 In: 1189.7 [P.O.:900; I.V.:189.7; IV Piggyback:100] Out: 775 [Urine:775] Intake/Output this shift: Total I/O In: 312.5 [P.O.:200; Blood:112.5] Out: 150 [Urine:150]   Recent Labs  12/30/12 0300 12/30/12 0940 12/30/12 1542 12/31/12 0358 01/01/13 0400  HGB 8.8* 8.7* 8.5* 8.5* 8.0*    Recent Labs  12/31/12 0358 01/01/13 0400  WBC 14.1* 10.4  RBC 2.58* 2.41*  HCT 24.6* 23.3*  PLT 54* 50*    Recent Labs  12/31/12 0358 01/01/13 0400  NA 136 135  K 3.4* 3.7  CL 99 99  CO2 26 27  BUN 31* 35*  CREATININE 2.03* 2.00*  GLUCOSE 136* 136*  CALCIUM 8.0* 8.1*    Recent Labs  12/29/12 1525 01/01/13 1035  INR 1.42 1.64*    Neurovascular intact Sensation intact distally Intact pulses distally Dorsiflexion/Plantar flexion intact Incision: dressing C/D/I Compartment soft  Assessment/Plan: 3 Days Post-Op Procedure(s) (LRB): TOTAL HIP ARTHROPLASTY WITH AUTOGRAFT (Right) Up with therapy WBAT RLE with post hip precautions Hold Eliquis today continue with mechanical dvt proph for now, will reassess tomorrow  Steady drop in Hgb transfusing 2 units PRBC Appreciate assistance from Cards and critical care teams  Dispo: pending medical/cards but once stable still planning home with HHPT   Xan Sparkman 01/01/2013, 2:00 PM

## 2013-01-02 ENCOUNTER — Other Ambulatory Visit: Payer: Self-pay | Admitting: Oncology

## 2013-01-02 ENCOUNTER — Encounter (HOSPITAL_COMMUNITY): Payer: Self-pay | Admitting: Orthopedic Surgery

## 2013-01-02 DIAGNOSIS — D649 Anemia, unspecified: Secondary | ICD-10-CM

## 2013-01-02 DIAGNOSIS — Z853 Personal history of malignant neoplasm of breast: Secondary | ICD-10-CM

## 2013-01-02 LAB — PHOSPHORUS: Phosphorus: 3.6 mg/dL (ref 2.3–4.6)

## 2013-01-02 LAB — TYPE AND SCREEN
Unit division: 0
Unit division: 0
Unit division: 0

## 2013-01-02 LAB — CBC WITH DIFFERENTIAL/PLATELET
Basophils Relative: 0 % (ref 0–1)
Eosinophils Relative: 4 % (ref 0–5)
HCT: 25.9 % — ABNORMAL LOW (ref 39.0–52.0)
Lymphocytes Relative: 8 % — ABNORMAL LOW (ref 12–46)
Lymphs Abs: 0.6 10*3/uL — ABNORMAL LOW (ref 0.7–4.0)
MCV: 93.5 fL (ref 78.0–100.0)
Monocytes Absolute: 0.7 10*3/uL (ref 0.1–1.0)
Monocytes Relative: 10 % (ref 3–12)
Neutrophils Relative %: 79 % — ABNORMAL HIGH (ref 43–77)
RBC: 2.77 MIL/uL — ABNORMAL LOW (ref 4.22–5.81)
WBC: 7.3 10*3/uL (ref 4.0–10.5)

## 2013-01-02 LAB — BASIC METABOLIC PANEL
BUN: 38 mg/dL — ABNORMAL HIGH (ref 6–23)
CO2: 27 mEq/L (ref 19–32)
Chloride: 98 mEq/L (ref 96–112)
GFR calc non Af Amer: 31 mL/min — ABNORMAL LOW (ref >90)
Glucose, Bld: 117 mg/dL — ABNORMAL HIGH (ref 70–99)
Potassium: 3.7 mEq/L (ref 3.5–5.1)

## 2013-01-02 LAB — DIC (DISSEMINATED INTRAVASCULAR COAGULATION)PANEL
INR: 1.37 (ref 0.00–1.49)
Smear Review: NONE SEEN
aPTT: 35 seconds (ref 24–37)

## 2013-01-02 LAB — GLUCOSE, CAPILLARY

## 2013-01-02 LAB — SEDIMENTATION RATE: Sed Rate: 96 mm/hr — ABNORMAL HIGH (ref 0–16)

## 2013-01-02 LAB — MAGNESIUM: Magnesium: 2.1 mg/dL (ref 1.5–2.5)

## 2013-01-02 LAB — VITAMIN B12: Vitamin B-12: 568 pg/mL (ref 211–911)

## 2013-01-02 LAB — RETICULOCYTES: Retic Ct Pct: 1.9 % (ref 0.4–3.1)

## 2013-01-02 MED ORDER — SODIUM CHLORIDE 0.9 % IJ SOLN: 10.0000 mL | Freq: Two times a day (BID) | INTRAMUSCULAR | Status: DC

## 2013-01-02 MED ORDER — SODIUM CHLORIDE 0.9 % IJ SOLN
10.0000 mL | INTRAMUSCULAR | Status: DC | PRN
  Administered 2013-01-02 – 2013-01-03 (×2): 30 mL
  Administered 2013-01-03: 10 mL
  Administered 2013-01-04: 20 mL
  Administered 2013-01-04 (×2): 30 mL

## 2013-01-02 MED ORDER — METOPROLOL TARTRATE 25 MG PO TABS
25.0000 mg | ORAL_TABLET | Freq: Two times a day (BID) | ORAL | Status: DC
  Administered 2013-01-02 – 2013-01-03 (×3): 25 mg via ORAL
  Filled 2013-01-02 (×5): qty 1

## 2013-01-02 MED ORDER — FUROSEMIDE 40 MG PO TABS
40.0000 mg | ORAL_TABLET | Freq: Two times a day (BID) | ORAL | Status: DC
  Administered 2013-01-02 – 2013-01-05 (×6): 40 mg via ORAL
  Filled 2013-01-02 (×8): qty 1

## 2013-01-02 NOTE — Progress Notes (Signed)
Subjective: 4 Days Post-Op Procedure(s) (LRB): TOTAL HIP ARTHROPLASTY WITH AUTOGRAFT (Right) Patient reports pain as mild.   Objective: Vital signs in last 24 hours: Temp:  [97.4 F (36.3 C)-99.3 F (37.4 C)] 99.3 F (37.4 C) (10/14 1358) Pulse Rate:  [65-131] 100 (10/14 1358) Resp:  [17-31] 18 (10/14 1600) BP: (94-123)/(46-91) 110/73 mmHg (10/14 1358) SpO2:  [78 %-100 %] 99 % (10/14 1358)  Intake/Output from previous day: 10/13 0701 - 10/14 0700 In: 1395 [P.O.:620; Blood:775] Out: 1450 [Urine:1450] Intake/Output this shift: Total I/O In: -  Out: 800 [Urine:800]   Recent Labs  12/31/12 0358 01/01/13 0400 01/02/13 0425  HGB 8.5* 8.0* 9.0*    Recent Labs  01/01/13 0400 01/02/13 0425 01/02/13 0930 01/02/13 0940  WBC 10.4 7.3  --   --   RBC 2.41* 2.77* 2.75*  --   HCT 23.3* 25.9*  --   --   PLT 50* 52*  --  53*    Recent Labs  01/01/13 0400 01/02/13 0425  NA 135 134*  K 3.7 3.7  CL 99 98  CO2 27 27  BUN 35* 38*  CREATININE 2.00* 1.94*  GLUCOSE 136* 117*  CALCIUM 8.1* 8.3*    Recent Labs  01/01/13 1035 01/02/13 0940  INR 1.64* 1.37    Neurologically intact  Assessment/Plan: 4 Days Post-Op Procedure(s) (LRB): TOTAL HIP ARTHROPLASTY WITH AUTOGRAFT (Right) Discharge home with home health  Emmaleigh Longo JR,W D 01/02/2013, 5:13 PM Will hold eliquis and check hg in am and restart if hg stable

## 2013-01-02 NOTE — Progress Notes (Signed)
Patient with elevated D-dimer result. Dr. Darnelle Catalan aware, no new orders at this time.

## 2013-01-02 NOTE — Consult Note (Signed)
[facility]  [phone]   ONCOLOGY  HOSPITAL CONSULTATION NOTE  Martin [name]                                Martin#: [id number]  DOB: November 11, 1930                            CSN#: [id number]  Referring MD: [facility] Primary MD: Martin [name]  Reason for Consult: Thrombocytopenia   NWG:NFAOZHY [name] is a 79 y.o.[employer], [place]  male we are asked to see for evaluation of thrombocytopenia. In review, he was initially seen in consultation by Dr. Cephas [name] at the [facility] on 03/31/2011. The patient carries a prior history of left breast cancer diagnosed in 1991 and treated by Dr. Glenford [name]. The tumor was lymph node negative. He was treated with a mastectomy and then had chemotherapy followed by tamoxifen therapy. He does not remember which chemotherapy drugs he received. He was first told he was thrombocytopenic subsequent to completion of his chemotherapy. Platelet counts had remained stable for 20 years , only to s to fluctuate when he has acute illness.  As far as 2009 his lowest platelet count recorded that year was 92,000. Counts provided by his primary care physician go back as far as 12/29/2009 when platelet count was 88,000. Subsequently the counts have been in the 74-111,000 range except for 1 outlier from 01/28/11 which was recorded as 30,000. Repeat on 11/27 was 74,000. Platelet count in our office on 03/31/11 was 109,000.   He has had no problems with multiple surgeries in the past including bilateral knee replacements and a left hip replacement. He did not bruise easily. He had coronary bypass surgery in 1985 which antedated his breast cancer diagnosis and chemotherapy treatments. He denies any history of hepatitis, yellow jaundice, malaria, or mononucleosis. He is on a number of medications that could affect platelet function including chronic colchicine and allopurinol. He admits to 2 large drinks daily with either bourbon or gin. He has a  chronically elevated MCV up to 103. There is no family history of any bleeding disorder. He was on anticoagulation with Pradaxa for chronic atrial fibrillation and is status post a implantable defibrillator.  Pradaxa was later changed to Eliquis 5 mg bid.  Dr Martin [name] concluded that the patient's chronic thrombocytopenia was related to previous chemotherapy treatments for male breast cancer, with a mild macrocytic anemia due to fairly heavy alcohol user. Normal white count and differential was noted. He may have a mild myelodysplastic syndrome related to his previous chemotherapy treatments.Long-term use of both allopurinol and colchicine were addressed (these 2 meds were not discontinued as an outpatient). Bone marrow suppressive effects of alcohol were reviewed. No  immune thrombocytopenia was suspected. Observation alone was recommended.He was to be seen as needed.   As outpatient he was recently on conservative treatments with NSAID's and corticosteriod injections for his osteoarthritis. Pain was severe, for which surgery was recommended.   After obtaining cardiology clearance, the patient was admitted on 12/29/2012 for a total right hip replacement with autograft. Status was complicated with postoperative hypotension, requiring pulmonary and critical care medicine involvement. He received 2 units of packed RBCs and 1 unit of platelets, IV fluid and pressors with good response. Of note, the patient has experienced CHF after fluid resuscitation requiring increased Lasix IV which may have affected his  platelet count as well.   PMH:  Past Medical History  Diagnosis Date  . Gout   . Hypertension   . Osteoarthritis   . Cardiomyopathy     ischemic  . Coronary artery disease   . Old anterior wall myocardial infarction   . Thrombocytopenia, 02/05/2011  . Atrial fibrillation   . Automatic implantable cardioverter-defibrillator in situ     dr Martin [name]    Surgeries:  .  Coronary artery bypass  graft      triple bypass in June 1985   .  Mastectomy, partial      left breast 1991   .  Arthroplasty    .  Anginoplasty      1995   .  Icd    bilateral total knee replacements. 2003 Left hip replacement 2006  Bilateral cataract surgery  Implantable defibrillator.  Tonsillectomy as a child       . Total hip arthroplasty Right 12/29/2012    Procedure: TOTAL HIP ARTHROPLASTY WITH AUTOGRAFT;  Surgeon: Martin [name]., MD;  Location: [facility] OR;  Service: Orthopedics;  Laterality: Right;    Allergies: No Known Allergies  Medications:    prior to admission:  Prescriptions prior to admission  Medication Sig Dispense Refill  . allopurinol (ZYLOPRIM) 300 MG tablet Take 1 tablet by mouth daily.      Marland Kitchen apixaban (ELIQUIS) 5 MG TABS tablet Take 5 mg by mouth 2 (two) times daily.      . colchicine 0.6 MG tablet Take 0.6 mg by mouth daily.        . ferrous sulfate 325 (65 FE) MG tablet Take 325 mg by mouth daily with breakfast.      . furosemide (LASIX) 40 MG tablet Take 40 mg by mouth 2 (two) times daily.        . metoprolol (LOPRESSOR) 50 MG tablet Take 50 mg by mouth 2 (two) times daily.        . multivitamin (THERAGRAN) per tablet Take 1 tablet by mouth daily.        . potassium chloride SA (K-DUR,KLOR-CON) 20 MEQ tablet Take 20 mEq by mouth daily.        . rosuvastatin (CRESTOR) 20 MG tablet Take 20 mg by mouth daily.        Marland Kitchen terazosin (HYTRIN) 5 MG capsule Take 5 mg by mouth at bedtime.        . valsartan (DIOVAN) 80 MG tablet Take 80 mg by mouth daily.      . vitamin C (ASCORBIC ACID) 500 MG tablet Take 500 mg by mouth daily.          Marland Kitchen atorvastatin  40 mg Oral q1800  . docusate sodium  100 mg Oral BID  . ferrous sulfate  325 mg Oral Q breakfast  . furosemide  40 mg Oral BID  . metoprolol tartrate  25 mg Oral BID  . multivitamin with minerals  1 tablet Oral Daily  . potassium chloride SA  20 mEq Oral Daily  . vitamin C  500 mg Oral Daily    ZOX:[id number], acetaminophen,  bisacodyl, HYDROcodone-acetaminophen, metoCLOPramide (REGLAN) injection, metoCLOPramide, ondansetron (ZOFRAN) IV, ondansetron, senna-docusate  ROS: Constitutional:  Negative for weight loss.  Negative  for fever, chills or  night sweats.Negative  for fatigue.  Eyes: Negative for blurred vision and double vision.  Respiratory: Negative for cough. No hemoptysis. No shortness of breath. No pleuritic chest pain.  Cardiovascular: Negative for chest pain. No palpitations.  GI: Negative  for  nausea, vomiting, diarrhea or constipation. No change in bowel caliber. No  Melena or hematochezia. No abdominal pain.  GU: Negative for hematuria. No loss of urinary control.No urinary retention. Skin: Negative for itching. No rash. No petechia. easy bruising. No gum bleed.  Neurological: No headaches. No motor or sensory deficits except for hearing loss. Musculoskeletal No pain except related to arthritis and recent surgery.    Family History:    Family History  Problem Relation Age of Onset  . Heart attack Father    he had one sibling a brother who died at age 33 of metastatic cancer to bone of unknown primary. Paternal grandmother died with colon cancer. Mother died with lung disease. One uncle died with bone cancer. Maternal grandmother died with breast cancer  No family history of bleeding disorders.  Social History: He retired from the  [employer] certified [employer]. He discontinued  Tobacco use in 1966. He drinks 2 large bourbon origin drinks daily . He is married. He has 3 healthy sons, 2 of them in town, several grand and great grand children. Marland Kitchen  Physical Exam    Filed Vitals:   01/02/13 1000  BP: 123/91  Pulse: 93  Temp:   Resp: 18     Filed Weights   12/29/12 1432 01/01/13 0500  Weight: 212 lb 15.4 oz (96.6 kg) 218 lb 7.6 oz (99.1 kg)    General:  76 -year-old white male  in no acute distress A. and O. x3  well-developed and ill-appearing HEENT: Normocephalic, atraumatic, PERRLA. Oral  cavity without thrush or lesions. No gingival bleeding. Neck supple. no thyromegaly, no cervical or supraclavicular adenopathy  Lungs clear bilaterally . No wheezing, rhonchi or rales. No axillary masses. Breasts: not examined. Cardiac irregularly irregular rate and rhythm,no murmur , rubs or gallops. scar right from previous Port-A-Cath Abdomen soft nontender , bowel sounds x4. No HSM. No masses palpable.  GU/rectal: deferred. Extremities no clubbing cyanosis, tense edema on the R>L LE No bruising or petechial rash Musculoskeletal: no spinal tenderness. Right hip dressing in place. Incision and tenderness. Neuro: Non Focal  Labs:     Recent Labs Lab 12/30/12 0940 12/30/12 1542 12/31/12 0358 01/01/13 0400 01/02/13 0425 01/02/13 0940  WBC 15.4* 12.9* 14.1* 10.4 7.3  --   HGB 8.7* 8.5* 8.5* 8.0* 9.0*  --   HCT 24.8* 24.4* 24.6* 23.3* 25.9*  --   PLT 75* 57* 54* 50* 52* 53*  MCV 94.7 94.9 95.3 96.7 93.5  --   MCH 33.2 33.1 32.9 33.2 32.5  --   MCHC 35.1 34.8 34.6 34.3 34.7  --   RDW 17.9* 17.7* 17.3* 16.6* 17.7*  --   LYMPHSABS  --   --  0.6* 0.4* 0.6*  --   MONOABS  --   --  0.7 0.7 0.7  --   EOSABS  --   --  0.1 0.1 0.3  --   BASOSABS  --   --  0.0 0.0 0.0  --          Recent Labs Lab 12/29/12 1500 12/30/12 0300 12/31/12 0358 01/01/13 0400 01/02/13 0425  NA 140 140 136 135 134*  K 3.7 3.8 3.4* 3.7 3.7  CL 102 102 99 99 98  CO2 27 26 26 27 27   GLUCOSE 142* 191* 136* 136* 117*  BUN 26* 26* 31* 35* 38*  CREATININE 1.62* 1.76* 2.03* 2.00* 1.94*  CALCIUM 8.1* 7.5* 8.0* 8.1* 8.3*  MG  --   --  1.8 2.2 2.1        Component Value Date/Time   BILITOT 1.0 12/21/2012 1124      Recent Labs Lab 12/29/12 1525 01/01/13 1035 01/02/13 0940  INR 1.42 1.64* PENDING     Recent Labs  01/02/13 0940  DDIMER PENDING     Anemia panel:  No results found for this basename: VITAMINB12, FOLATE, FERRITIN, TIBC, IRON, RETICCTPCT,  in the last 72  hours  Urinalysis    Component Value Date/Time   COLORURINE YELLOW 12/21/2012 1124   APPEARANCEUR CLEAR 12/21/2012 1124   LABSPEC 1.010 12/21/2012 1124   PHURINE 7.0 12/21/2012 1124   GLUCOSEU NEGATIVE 12/21/2012 1124   HGBUR TRACE* 12/21/2012 1124   BILIRUBINUR NEGATIVE 12/21/2012 1124   KETONESUR NEGATIVE 12/21/2012 1124   PROTEINUR NEGATIVE 12/21/2012 1124   UROBILINOGEN 0.2 12/21/2012 1124   NITRITE NEGATIVE 12/21/2012 1124   LEUKOCYTESUR NEGATIVE 12/21/2012 1124    Imaging Studies:  Dg Chest 2 View  12/21/2012   CLINICAL DATA:  Pre-admission for hip replacement  EXAM: CHEST  2 VIEW  COMPARISON:  11/23/2007  FINDINGS: Cardiomediastinal silhouette is stable. Single lead cardiac pacemaker is unchanged in position. Surgical clips in left axilla again noted. No acute infiltrate or pulmonary edema. Stable minimal degenerative changes thoracic spine.  IMPRESSION: No active cardiopulmonary disease.  No significant change.   Electronically Signed   By: Natasha Mead   On: 12/21/2012 12:12   Dg Hip Operative Right  12/29/2012   CLINICAL DATA:  Total hip arthroplasty  EXAM: DG OPERATIVE RIGHT HIP  TECHNIQUE: A single spot fluoroscopic AP image of the right hip is submitted.  COMPARISON:  None.  FINDINGS: The right femoral neck is not clearly visualized. The proximal femur is noted. A prosthetic device is not visualized.  IMPRESSION: Absence of the femoral head.   Electronically Signed   By: Maryclare Bean M.D.   On: 12/29/2012 10:24    A/P: 79 y.o. male with known chronic thrombocytopenia and anemia, likely secondary to previous chemotherapy treatments for male breast cancer, with a mild macrocytic anemia Mild myelodysplastic syndrome related to his previous chemotherapy treatments was suspected;long-term use of both allopurinol and colchicine are affecting the low platelet counts as well. As outpatient he was recently on conservative treatments with NSAID's until recently. Moreover, he is on Eliquis as directed  by Cardiology. Platelet count was aggravated by ABL requiring one unit transfusion of platelets and 2 units of PRBCs, dilution for the treatment of hypotension, and Lasix IV for the treatment of acute CHF.  Smear has been ordered for review. DIC panel  HIT panel , reticulocyte count, Folic Acid, B12, Ferritin, ESR are pending to rule out other potential causes.Dr. Darnelle Catalan   is to see the patient following this consult with recommendations regarding diagnosis and  further workup studies. An addendum to this note is to be written.    Thank you for the referral.  Marcos Eke, PA-C 01/02/2013 10:54 AM  ADDENDUM: This 79 y/o [place] man is referred for management of thrombocytopenia and anemia s/p R hip arthroplasty w autograft 12/29/12  I have reviewed his blood film and DIC panel--no schistocytes, no nucleated RBCs, no "teardrops", no platelet clumps, mild left shift in the white cell series. That means: no TTP or ITP, no evidence of myelodysplasia or leukemia,  And moderate thrombocytopenia is confirmed.  I explained to Martin [name] that while in a young patient thrombocytopenia and anemia likely has one cause, in someone his age likely  there are multiple causes at play. These include:  Decreased marrow reserve due to age and prior chemotherapy  Increased consumption of platelets and RBCssecondary to recent surgery  Decreased red cell production due to renal insufficiency (low epo)  Chronic ETOH use, which is a marrow suppressant; it also explains his mildly elevated MCV (lipid alterations in RBC membrane due to ETOH effect on liver)   Doubt, but cannot rule out ITP (that would require a trial of therapy or a marrow biopsy)  Recommend: 1) may start Eliquis at any time--I am more concerned about the risk of clotting than bleeding at this point. 2) he needs genetic testing given the history of breast cancer in a male--I will set that up as outpatient 3) I will see him again in 2-3 months to  review genetics results and to reassess his cytopenias-- consider bone marrow biopsy at that point 4) I have recommended to Martin [name] that he cut his ETOH intake in half   I personally saw this patient and performed a substantive portion of this encounter with the listed APP documented above.   Lowella Dell, MD

## 2013-01-02 NOTE — Progress Notes (Signed)
Rehab Admissions Coordinator Note:  Patient was screened by Clois Dupes for appropriateness for an Inpatient Acute Rehab Consult.  At this time, would await further therapy progress before requesting an inpt rehab consult .  Clois Dupes 01/02/2013, 9:53 AM  I can be reached at [phone].

## 2013-01-02 NOTE — Progress Notes (Signed)
Physical Therapy Treatment Patient Details Name: ZOLTAN GENEST MRN: [id number] DOB: Mar 07, 1931 Today's Date: 01/02/2013 Time: 4098-1191 PT Time Calculation (min): 32 min  PT Assessment / Plan / Recommendation  History of Present Illness Pt underwent posterior rt THA.  Pt with post-op hypotension requiring pressors and ICU monitoring.   PT Comments   Pt motivated and demos improved mobility from yesterday.  Per RN tele alarmed with pt's HR hitting 140's nonsustained during ambulating.    Follow Up Recommendations  CIR     Does the patient have the potential to tolerate intense rehabilitation     Barriers to Discharge        Equipment Recommendations  None recommended by PT    Recommendations for Other Services    Frequency 7X/week   Progress towards PT Goals Progress towards PT goals: Progressing toward goals  Plan Current plan remains appropriate    Precautions / Restrictions Precautions Precautions: Posterior Hip;Fall Precaution Comments: pt verbalized 3/3 hip precautions.  Required Braces or Orthoses: Knee Immobilizer - Right Knee Immobilizer - Right: Other (comment) (as needed for THA precautions) Restrictions Weight Bearing Restrictions: Yes RLE Weight Bearing: Weight bearing as tolerated   Pertinent Vitals/Pain Pt indicates pain is tolerable this pm, just fatigued.      Mobility  Bed Mobility Bed Mobility: Supine to Sit;Sitting - Scoot to Edge of Bed;Sit to Supine Supine to Sit: 3: Mod assist Sitting - Scoot to Edge of Bed: 4: Min assist Sit to Supine: 3: Mod assist Details for Bed Mobility Assistance: A with Bil LEs to return to bed.   Transfers Transfers: Sit to Stand;Stand to Sit Sit to Stand: 3: Mod assist;With upper extremity assist;From bed Stand to Sit: 4: Min assist;With upper extremity assist;To bed Details for Transfer Assistance: Cues for UE use and A to bring hips up to stand.   Ambulation/Gait Ambulation/Gait Assistance: 4: Min assist Ambulation  Distance (Feet): 100 Feet Assistive device: Rolling walker Ambulation/Gait Assistance Details: cues for upright psoture.  Motivated to continue ambulating.   Gait Pattern: Step-through pattern;Decreased stride length;Decreased step length - left;Decreased stance time - right;Trunk flexed Stairs: No Wheelchair Mobility Wheelchair Mobility: No    Exercises Total Joint Exercises Ankle Circles/Pumps: AROM;Both;10 reps   PT Diagnosis:    PT Problem List:   PT Treatment Interventions:     PT Goals (current goals can now be found in the care plan section) Acute Rehab PT Goals Patient Stated Goal: return home Time For Goal Achievement: 01/06/13 Potential to Achieve Goals: Good  Visit Information  Last PT Received On: 01/02/13 Assistance Needed: +1 History of Present Illness: Pt underwent posterior rt THA.  Pt with post-op hypotension requiring pressors and ICU monitoring.    Subjective Data  Patient Stated Goal: return home   Cognition  Cognition Arousal/Alertness: Awake/alert Behavior During Therapy: WFL for tasks assessed/performed Overall Cognitive Status: Within Functional Limits for tasks assessed    Balance  Balance Balance Assessed: No  End of Session PT - End of Session Equipment Utilized During Treatment: Gait belt Activity Tolerance: Patient limited by fatigue Patient left: in bed;with call bell/phone within reach Nurse Communication: Mobility status   GP     Sunny Schlein, [place] [phone] 01/02/2013, 2:17 PM

## 2013-01-02 NOTE — Progress Notes (Signed)
Physical Therapy Treatment Patient Details Name: Martin [name] MRN: [id number] DOB: [birth year] Today's Date: 01/02/2013 Time: 4098-1191 PT Time Calculation (min): 26 min  PT Assessment / Plan / Recommendation  History of Present Illness Pt underwent posterior rt THA.  Pt with post-op hypotension requiring pressors and ICU monitoring.   PT Comments   Pt with improved mobility today and HR only up to 134 nonsustained during activity.  RN requested trial mobility without supplemental O2 with pt desating to mid 80's during mobility, but able to quickly recover with seated rest.  Pending continued progress may need to consider short rehab stay prior to returning home with wife.  Would be appropriate for CIR.    Follow Up Recommendations  CIR     Does the patient have the potential to tolerate intense rehabilitation     Barriers to Discharge        Equipment Recommendations  None recommended by PT    Recommendations for Other Services    Frequency 7X/week   Progress towards PT Goals Progress towards PT goals: Progressing toward goals  Plan Discharge plan needs to be updated    Precautions / Restrictions Precautions Precautions: Posterior Hip;Fall Precaution Comments: pt verbalized 2/3 hip precautions.  Required Braces or Orthoses: Knee Immobilizer - Right Knee Immobilizer - Right: Other (comment) (as needed for THA precautions) Restrictions Weight Bearing Restrictions: Yes RLE Weight Bearing: Weight bearing as tolerated   Pertinent Vitals/Pain Indicates pain is minimal at this time.  Denied need for pain meds.      Mobility  Bed Mobility Bed Mobility: Sit to Supine Sit to Supine: 3: Mod assist Details for Bed Mobility Assistance: A with Bil LEs to return to bed.   Transfers Transfers: Sit to Stand;Stand to Sit Sit to Stand: 3: Mod assist;With upper extremity assist;From chair/3-in-1;With armrests Stand to Sit: 4: Min assist;With upper extremity assist;To bed Details for  Transfer Assistance: Cues for UE use and A to bring hips up to stand.   Ambulation/Gait Ambulation/Gait Assistance: 4: Min assist Ambulation Distance (Feet): 60 Feet Assistive device: Rolling walker Ambulation/Gait Assistance Details: pt moving better today.  cues for upright psoture.   Gait Pattern: Step-through pattern;Decreased stride length;Decreased step length - left;Decreased stance time - right;Trunk flexed Stairs: No Wheelchair Mobility Wheelchair Mobility: No    Exercises Total Joint Exercises Ankle Circles/Pumps: AROM;Both;10 reps Long Arc Quad: AROM;Both;15 reps   PT Diagnosis:    PT Problem List:   PT Treatment Interventions:     PT Goals (current goals can now be found in the care plan section) Acute Rehab PT Goals Patient Stated Goal: return home Time For Goal Achievement: 01/06/13 Potential to Achieve Goals: Good  Visit Information  Last PT Received On: 01/02/13 Assistance Needed: +1 History of Present Illness: Pt underwent posterior rt THA.  Pt with post-op hypotension requiring pressors and ICU monitoring.    Subjective Data  Patient Stated Goal: return home   Cognition  Cognition Arousal/Alertness: Awake/alert Behavior During Therapy: WFL for tasks assessed/performed Overall Cognitive Status: Within Functional Limits for tasks assessed    Balance  Balance Balance Assessed: No  End of Session PT - End of Session Equipment Utilized During Treatment: Gait belt Activity Tolerance: Patient limited by fatigue Patient left: in bed;with call bell/phone within reach Nurse Communication: Mobility status   GP     Sunny Schlein, [place] [phone] 01/02/2013, 9:32 AM

## 2013-01-02 NOTE — Progress Notes (Signed)
SUBJECTIVE:  No complaints  OBJECTIVE:   Vitals:   Filed Vitals:   01/02/13 0800 01/02/13 0803 01/02/13 0900 01/02/13 1000  BP: 112/65  109/69 123/91  Pulse: 88  109 93  Temp:  97.6 F (36.4 C)    TempSrc:  Oral    Resp: 20  19 18   Height:      Weight:      SpO2: 98%  96% 96%   I&O's:   Intake/Output Summary (Last 24 hours) at 01/02/13 1002 Last data filed at 01/02/13 0900  Gross per 24 hour  Intake   1395 ml  Output   1700 ml  Net   -305 ml   TELEMETRY: Reviewed telemetry pt in atrial fibrillation     PHYSICAL EXAM General: Well developed, well nourished, in no acute distress Head: Eyes PERRLA, No xanthomas.   Normal cephalic and atramatic  Lungs:   Clear bilaterally to auscultation and percussion. Heart:   IRRR S1 S2 Pulses are 2+ & equal .Abdomen: Bowel sounds are positive, abdomen soft and non-tender without masses Extremities:   No clubbing, cyanosis or edema.  DP +1 Neuro: Alert and oriented X 3. Psych:  Good affect, responds appropriately   LABS: Basic Metabolic Panel:  Recent Labs  16/10/96 0400 01/02/13 0425  NA 135 134*  K 3.7 3.7  CL 99 98  CO2 27 27  GLUCOSE 136* 117*  BUN 35* 38*  CREATININE 2.00* 1.94*  CALCIUM 8.1* 8.3*  MG 2.2 2.1  PHOS 3.4 3.6   Liver Function Tests: No results found for this basename: AST, ALT, ALKPHOS, BILITOT, PROT, ALBUMIN,  in the last 72 hours No results found for this basename: LIPASE, AMYLASE,  in the last 72 hours CBC:  Recent Labs  01/01/13 0400 01/02/13 0425  WBC 10.4 7.3  NEUTROABS 9.1* 5.8  HGB 8.0* 9.0*  HCT 23.3* 25.9*  MCV 96.7 93.5  PLT 50* 52*   Cardiac Enzymes: No results found for this basename: CKTOTAL, CKMB, CKMBINDEX, TROPONINI,  in the last 72 hours BNP: No components found with this basename: POCBNP,  D-Dimer: No results found for this basename: DDIMER,  in the last 72 hours Hemoglobin A1C: No results found for this basename: HGBA1C,  in the last 72 hours Fasting Lipid  Panel: No results found for this basename: CHOL, HDL, LDLCALC, TRIG, CHOLHDL, LDLDIRECT,  in the last 72 hours Thyroid Function Tests: No results found for this basename: TSH, T4TOTAL, FREET3, T3FREE, THYROIDAB,  in the last 72 hours Anemia Panel: No results found for this basename: VITAMINB12, FOLATE, FERRITIN, TIBC, IRON, RETICCTPCT,  in the last 72 hours Coag Panel:   Lab Results  Component Value Date   INR 1.64* 01/01/2013   INR 1.42 12/29/2012   INR 1.57* 12/21/2012    RADIOLOGY: Dg Chest 2 View  12/21/2012   CLINICAL DATA:  Pre-admission for hip replacement  EXAM: CHEST  2 VIEW  COMPARISON:  11/23/2007  FINDINGS: Cardiomediastinal silhouette is stable. Single lead cardiac pacemaker is unchanged in position. Surgical clips in left axilla again noted. No acute infiltrate or pulmonary edema. Stable minimal degenerative changes thoracic spine.  IMPRESSION: No active cardiopulmonary disease.  No significant change.   Electronically Signed   By: Natasha Mead   On: 12/21/2012 12:12   Dg Hip Operative Right  12/29/2012   CLINICAL DATA:  Total hip arthroplasty  EXAM: DG OPERATIVE RIGHT HIP  TECHNIQUE: A single spot fluoroscopic AP image of the right hip is submitted.  COMPARISON:  None.  FINDINGS: The right femoral neck is not clearly visualized. The proximal femur is noted. A prosthetic device is not visualized.  IMPRESSION: Absence of the femoral head.   Electronically Signed   By: Maryclare Bean M.D.   On: 12/29/2012 10:24   Dg Pelvis Portable  12/29/2012   *RADIOLOGY REPORT*  Clinical Data: Postop right total hip replacement, initial encounter.  PORTABLE PELVIS  Comparison: None  Findings:  The patient undergone a right total hip replacement.  Alignment appears near anatomic.  No evidence of hardware failure or loosening.  Skin staples overlie the upper outer aspect of the thigh.  There are expected scattered foci of subcutaneous emphysema about the operative site.  No radiopaque foreign body.  No  fracture.  Limited visualization of the contralateral left total hip replacement is normal.  IMPRESSION: Post right total hip replacement without evidence of complication.   Original Report Authenticated By: Tacey Ruiz, MD   Dg Chest Port 1 View  12/30/2012   CLINICAL DATA:  Evaluate for airspace disease.  EXAM: PORTABLE CHEST - 1 VIEW  COMPARISON:  12/29/2012  FINDINGS: Reticular densities in noted in the left lower lung zone, likely atelectasis. There is no convincing pneumonia. No pulmonary edema.  There is no pneumothorax. Changes from cardiac surgery are stable. Mild cardiomegaly is stable.  Right internal jugular central venous line has its tip just above the caval atrial junction, also unchanged.  IMPRESSION: 1. Left basilar opacity is likely atelectasis. No convincing pneumonia and no evidence of pulmonary edema. 2. No change from the previous day's study.   Electronically Signed   By: Amie Portland M.D.   On: 12/30/2012 07:42   Dg Chest Port 1 View  12/29/2012   CLINICAL DATA:  Pulmonary edema.  EXAM: PORTABLE CHEST - 1 VIEW  COMPARISON:  Same day.  FINDINGS: Stable cardiomegaly. Sternotomy wires are noted. Left-sided pacemaker is unchanged in position. Stable central pulmonary vascular congestion. No acute pulmonary disease or edema is noted.  IMPRESSION: Stable cardiomegaly. No definite evidence of pulmonary edema seen currently.   Electronically Signed   By: Roque Lias M.D.   On: 12/29/2012 20:31   Dg Chest Port 1 View  12/29/2012   *RADIOLOGY REPORT*  Clinical Data: Post right IJ line placement, subsequent encounter.  PORTABLE CHEST - 1 VIEW  Comparison: 12/21/2012  Findings:  Grossly unchanged enlarged cardiac silhouette and mediastinal contours post median sternotomy.  Interval placement of a right jugular approach central venous catheter with tip overlying the superior cavoatrial junction.  Stable positioning of left anterior chest wall single lead AICD / pacemaker.  Mild pulmonary  venous congestion without frank evidence of edema.  Slight worsening of bilateral infrahilar opacities favored to represent atelectasis. No definite pleural effusion.  Grossly unchanged bones.  Mild gaseous distension of the stomach.  IMPRESSION: 1.  Interval placement of right jugular approach central venous catheter with tip overlying the superior cavoatrial junction.  No definite pneumothorax. 2.  Cardiomegaly and pulmonary venous congestion without frank evidence of edema. 3.  Slight worsening of perihilar atelectasis.   Original Report Authenticated By: Tacey Ruiz, MD   Dg Hip Portable 1 View Right  12/29/2012   *RADIOLOGY REPORT*  Clinical Data: Post right total hip replacement  PORTABLE RIGHT HIP - 1 VIEW  Comparison: Earlier same day  Findings:  This examination is interpreted in conjunction with AP pelvis radiograph performed earlier same day.  The patient has undergone a right total hip replacement.  Alignment appears near anatomic.  No evidence of hardware failure or loosening.  No fracture.  Skin staples overlie the upper outer aspect of the right thigh.  There are expected scattered foci of subcutaneous emphysema about the operative site.  No radiopaque foreign body.  IMPRESSION: Post right total hip replacement without evidence of complication.   Original Report Authenticated By: Tacey Ruiz, MD    ASSESSMENT AND PLAN  1. Postoperative hypotension likely due to volume loss requiring support with neosynephrine and vasopressin, off vasopressin since yesterday, off neo since this am. hemodynamically stable, BP 123/6mmHg this am.  2. CHF - after fluid replacement for hypotension, positive 4L in the last 72H, worsening Crea after lasix.  Slightly improved after holding for a day.  He is back on IV Lasix.  He appears euvolemic on exam.  Will change IV Lasix back to PO home dose. 3. Coronary artery disease with previous ischemic cardiomyopathy and low ejection fraction  4. Chronic atrial  fibrillation - HR controlled at rest on metoprolol 12.5 mg po bid but when he ambulates HR goes up in the 120-130's.  Will increase metoprolol to 25mg  BID 5. Long-term anticoagulation with Eliquis, ortho ok with restarting back today 6. Hyperlipidemia - on lipitor 40 mg QHS    Quintella Reichert, MD  01/02/2013  10:02 AM

## 2013-01-02 NOTE — Progress Notes (Signed)
Occupational Therapy Treatment Patient Details Name: Martin [name] MRN: [id number] DOB: 1930-05-08 Today's Date: 01/02/2013 Time: 4098-1191 OT Time Calculation (min): 23 min  OT Assessment / Plan / Recommendation  History of present illness Pt underwent posterior rt THA.  Pt with post-op hypotension requiring pressors and ICU monitoring.   OT comments  Pt requires min A for BADLs with the exception of mod A - min A for sit to stand during functional transfers.  Requires mod A for bed mobility.  Pt with dyspnea 3/4 on 2L; 02 91%; HR 147.  Pt states he may be able to go home tomorrow, and that wife can provide the above assist.  Don't feel pt quite ready for discharge as he also has 6-7 steps to enter his house.    Follow Up Recommendations  No OT follow up;Supervision/Assistance - 24 hour    Barriers to Discharge       Equipment Recommendations  None recommended by OT    Recommendations for Other Services    Frequency Min 2X/week   Progress towards OT Goals Progress towards OT goals: Progressing toward goals  Plan Discharge plan remains appropriate    Precautions / Restrictions Precautions Precautions: Posterior Hip;Fall Precaution Comments: pt verbalized 3/3 hip precautions.  Required Braces or Orthoses: Knee Immobilizer - Right Knee Immobilizer - Right: Other (comment) (as needed for THA precautions) Restrictions Weight Bearing Restrictions: Yes RLE Weight Bearing: Weight bearing as tolerated   Pertinent Vitals/Pain     ADL  Lower Body Dressing: Minimal assistance Where Assessed - Lower Body Dressing: Supported sit to stand Toilet Transfer: Minimal assistance Toilet Transfer Method: Sit to stand;Stand pivot Toilet Transfer Equipment: Raised toilet seat with arms (or 3-in-1 over toilet) Toileting - Clothing Manipulation and Hygiene: Minimal assistance Where Assessed - Glass blower/designer Manipulation and Hygiene: Standing Equipment Used: Reacher;Sock aid;Rolling  walker Transfers/Ambulation Related to ADLs: min A sit to stand; min guard ambulation ADL Comments: Pt able to return demonstration of use of AE for LB dsg.  Dyspnea 3/4 with activity on 2 L; 02 91%; HR 147    OT Diagnosis:    OT Problem List:   OT Treatment Interventions:     OT Goals(current goals can now be found in the care plan section) Acute Rehab OT Goals Patient Stated Goal: return home OT Goal Formulation: With patient/family Time For Goal Achievement: 01/08/13 Potential to Achieve Goals: Good ADL Goals Pt Will Perform Grooming: with supervision;standing Pt Will Perform Lower Body Bathing: with supervision;with adaptive equipment;sit to/from stand Pt Will Perform Lower Body Dressing: with supervision;with adaptive equipment;sit to/from stand Pt Will Transfer to Toilet: with supervision;ambulating;bedside commode Pt Will Perform Toileting - Clothing Manipulation and hygiene: with supervision;sit to/from stand Pt Will Perform Tub/Shower Transfer: Shower transfer;with supervision;rolling walker;3 in 1  Visit Information  Last OT Received On: 01/02/13 Assistance Needed: +1 History of Present Illness: Pt underwent posterior rt THA.  Pt with post-op hypotension requiring pressors and ICU monitoring.    Subjective Data      Prior Functioning       Cognition  Cognition Arousal/Alertness: Awake/alert Behavior During Therapy: WFL for tasks assessed/performed Overall Cognitive Status: Within Functional Limits for tasks assessed    Mobility  Bed Mobility Bed Mobility: Supine to Sit;Sitting - Scoot to Edge of Bed;Sit to Supine Supine to Sit: 3: Mod assist;HOB flat;With rails Sitting - Scoot to Edge of Bed: 4: Min assist;With rail Sit to Supine: 3: Mod assist;HOB flat;With rail Details for Bed Mobility Assistance: requires assist  for LEs, and assist to lift shoulders.  Step by step cues for technique Transfers Transfers: Sit to Stand;Stand to Sit Sit to Stand: With upper  extremity assist;3: Mod assist;From bed Stand to Sit: 4: Min assist;With upper extremity assist;To bed Details for Transfer Assistance: cues for technique and assist to stand    Exercises  Total Joint Exercises Ankle Circles/Pumps: AROM;Both;10 reps   Balance Balance Balance Assessed: No   End of Session OT - End of Session Equipment Utilized During Treatment: Rolling walker Activity Tolerance: Patient tolerated treatment well Patient left: in bed;with call bell/phone within reach Nurse Communication: Mobility status  GO     Martisha Toulouse, Ursula Alert M 01/02/2013, 3:17 PM

## 2013-01-02 NOTE — Progress Notes (Signed)
Spoke with Dr. [name] this afternoon, ok to continue right IJ line.  Nursing will continue to monitor.

## 2013-01-03 ENCOUNTER — Telehealth: Payer: Self-pay | Admitting: *Deleted

## 2013-01-03 DIAGNOSIS — M79609 Pain in unspecified limb: Secondary | ICD-10-CM

## 2013-01-03 LAB — BASIC METABOLIC PANEL
BUN: 38 mg/dL — ABNORMAL HIGH (ref 6–23)
CO2: 24 mEq/L (ref 19–32)
Calcium: 8.4 mg/dL (ref 8.4–10.5)
Chloride: 99 mEq/L (ref 96–112)
Creatinine, Ser: 1.71 mg/dL — ABNORMAL HIGH (ref 0.50–1.35)
Glucose, Bld: 127 mg/dL — ABNORMAL HIGH (ref 70–99)

## 2013-01-03 LAB — CBC WITH DIFFERENTIAL/PLATELET
Basophils Relative: 0 % (ref 0–1)
Eosinophils Absolute: 0.1 10*3/uL (ref 0.0–0.7)
HCT: 27.6 % — ABNORMAL LOW (ref 39.0–52.0)
Hemoglobin: 9.4 g/dL — ABNORMAL LOW (ref 13.0–17.0)
Lymphs Abs: 0.6 10*3/uL — ABNORMAL LOW (ref 0.7–4.0)
MCH: 31.9 pg (ref 26.0–34.0)
MCHC: 34.1 g/dL (ref 30.0–36.0)
MCV: 93.6 fL (ref 78.0–100.0)
Monocytes Absolute: 1 10*3/uL (ref 0.1–1.0)
Monocytes Relative: 13 % — ABNORMAL HIGH (ref 3–12)
Neutro Abs: 5.6 10*3/uL (ref 1.7–7.7)
RBC: 2.95 MIL/uL — ABNORMAL LOW (ref 4.22–5.81)

## 2013-01-03 MED ORDER — APIXABAN 2.5 MG PO TABS
2.5000 mg | ORAL_TABLET | Freq: Two times a day (BID) | ORAL | Status: DC
  Administered 2013-01-03 – 2013-01-05 (×5): 2.5 mg via ORAL
  Filled 2013-01-03 (×6): qty 1

## 2013-01-03 NOTE — Progress Notes (Signed)
   CARE MANAGEMENT NOTE 01/03/2013  Patient:  Martin [name], Martin [name]   Account Number:  [id number]  Date Initiated:  12/29/2012  Documentation initiated by:  [facility]  Subjective/Objective Assessment:   TOTAL HIP ARTHROPLASTY WITH AUTOGRAFT (Right) , complications from surgery, hypotension     Action/Plan:   waiting PT/OT evaluation   Anticipated DC Date:  01/04/2013   Anticipated DC Plan:  HOME W HOME HEALTH SERVICES      DC Planning Services  CM consult      [facility] Choice  HOME HEALTH   Choice offered to / List presented to:  C-1 Patient        HH arranged  HH-2 PT      [facility] agency  [facility]   Status of service:  Completed, signed off Medicare Important Message given?   (If response is "NO", the following Medicare IM given date fields will be blank) Date Medicare IM given:   Date Additional Medicare IM given:    Discharge Disposition:  HOME W HOME HEALTH SERVICES  Per UR Regulation:    If discussed at Long Length of Stay Meetings, dates discussed:    Comments:  01/03/2013 1145 NCM spoke to pt and states he has DME at home. Pt wants to go home. Requesting [employer] for [facility]. Notified [employer] rep for [facility]. Lives at home with wife who is able to assist with his care. Isidoro Donning RN CCM Case Mgmt phone [phone]  01/02/2013 1520 Waiting progressing with PT. Pt will be evaluated for CIR. Isidoro Donning RN CCM Case Mgmt phone [phone]

## 2013-01-03 NOTE — Progress Notes (Signed)
*  PRELIMINARY RESULTS* Vascular Ultrasound Lower extremity venous duplex has been completed.  Preliminary findings: no obvious evidence of DVT or baker's cyst.    Farrel Demark, RDMS, RVT  01/03/2013, 1:36 PM

## 2013-01-03 NOTE — Progress Notes (Signed)
Physical Therapy Treatment Patient Details Name: Martin [name] MRN: [id number] DOB: [birth year] Today's Date: 01/03/2013 Time: 4098-1191 PT Time Calculation (min): 27 min  PT Assessment / Plan / Recommendation  History of Present Illness Pt underwent posterior rt THA.  Pt with post-op hypotension requiring pressors and ICU monitoring.   PT Comments   Pt pleasant & motivated to progress with activity.  Making steady progress with mobility.  Fatigues fairly quickly.    Follow Up Recommendations  CIR     Does the patient have the potential to tolerate intense rehabilitation     Barriers to Discharge        Equipment Recommendations  None recommended by PT    Recommendations for Other Services    Frequency 7X/week   Progress towards PT Goals Progress towards PT goals: Progressing toward goals  Plan Current plan remains appropriate    Precautions / Restrictions Precautions Precautions: Fall Precaution Comments: verbalized 2/3 hip precautions Required Braces or Orthoses: Knee Immobilizer - Right Knee Immobilizer - Right: Other (comment) (as needed for THA precautions) Restrictions RLE Weight Bearing: Weight bearing as tolerated   Pertinent Vitals/Pain 140's with ambulation per RN.      Mobility  Bed Mobility Bed Mobility: Supine to Sit;Sitting - Scoot to Edge of Bed Supine to Sit: 4: Min guard;With rails;HOB flat Details for Bed Mobility Assistance: Heavy reliance of use of rails Transfers Transfers: Sit to Stand;Stand to Sit Sit to Stand: 4: Min assist;4: Min guard;With upper extremity assist;With armrests;From chair/3-in-1;From bed Stand to Sit: 4: Min guard;With upper extremity assist;With armrests;To chair/3-in-1 Details for Transfer Assistance: cues for hand placement & RLE positioniing before sitting.  Min (A) to achieve standing from bed; Min Guard to stand from 3-in-1 with armrrests Ambulation/Gait Ambulation/Gait Assistance: 4: Min guard Ambulation Distance  (Feet): 120 Feet Assistive device: Rolling walker Ambulation/Gait Assistance Details: cues for posture & to look ahead.  Pt limited by fatigue.   Gait Pattern: Step-through pattern;Trunk flexed;Decreased weight shift to right Gait velocity: slow General Gait Details: HR 140's with ambulation per RN Stairs: No Wheelchair Mobility Wheelchair Mobility: No    Exercises Total Joint Exercises Ankle Circles/Pumps: AROM;Both;10 reps Gluteal Sets: AROM;Both;10 reps Heel Slides: AROM;Strengthening;Right;10 reps;Supine Long Arc Quad: AROM;Right;10 reps;Seated Marching in Standing: AROM;Strengthening;Right;10 reps;Standing     PT Goals (current goals can now be found in the care plan section) Acute Rehab PT Goals PT Goal Formulation: With patient Time For Goal Achievement: 01/06/13 Potential to Achieve Goals: Good  Visit Information  Last PT Received On: 01/03/13 Assistance Needed: +1 History of Present Illness: Pt underwent posterior rt THA.  Pt with post-op hypotension requiring pressors and ICU monitoring.    Subjective Data      Cognition  Cognition Arousal/Alertness: Awake/alert Behavior During Therapy: WFL for tasks assessed/performed Overall Cognitive Status: Within Functional Limits for tasks assessed    Balance     End of Session PT - End of Session Equipment Utilized During Treatment: Gait belt Activity Tolerance: Patient tolerated treatment well Patient left: in chair;with call bell/phone within reach Nurse Communication: Mobility status   GP     Lara Mulch 01/03/2013, 9:03 AM  Verdell Face, PTA [phone] 01/03/2013

## 2013-01-03 NOTE — Progress Notes (Signed)
Subjective: 5 Days Post-Op Procedure(s) (LRB): TOTAL HIP ARTHROPLASTY WITH AUTOGRAFT (Right) Patient reports pain as mild.  Patient reports he was able to ambulate to bathroom on his own, does c/o some SOB with activity but has not used nasal cannula since last night with stable O2 levels.  Denies chest pain, dizziness, lightheadedness, confusion, cough.    Objective: Vital signs in last 24 hours: Temp:  [97.6 F (36.4 C)-99.3 F (37.4 C)] 98 F (36.7 C) (10/15 0541) Pulse Rate:  [86-114] 86 (10/15 1011) Resp:  [18] 18 (10/15 0541) BP: (107-127)/(58-84) 115/76 mmHg (10/15 1011) SpO2:  [94 %-100 %] 94 % (10/15 0541)  Intake/Output from previous day: 10/14 0701 - 10/15 0700 In: 720 [P.O.:720] Out: 800 [Urine:800] Intake/Output this shift:     Recent Labs  01/01/13 0400 01/02/13 0425 01/03/13 0545  HGB 8.0* 9.0* 9.4*    Recent Labs  01/02/13 0425 01/02/13 0930 01/02/13 0940 01/03/13 0545  WBC 7.3  --   --  7.2  RBC 2.77* 2.75*  --  2.95*  HCT 25.9*  --   --  27.6*  PLT 52*  --  53* 74*    Recent Labs  01/02/13 0425 01/03/13 0545  NA 134* 137  K 3.7 3.5  CL 98 99  CO2 27 24  BUN 38* 38*  CREATININE 1.94* 1.71*  GLUCOSE 117* 127*  CALCIUM 8.3* 8.4    Recent Labs  01/01/13 1035 01/02/13 0940  INR 1.64* 1.37    Neurovascular intact Sensation intact distally Intact pulses distally Dorsiflexion/Plantar flexion intact Incision: dressing C/D/I and moderate drainage No cellulitis present Compartment soft  Assessment/Plan: 5 Days Post-Op Procedure(s) (LRB): TOTAL HIP ARTHROPLASTY WITH AUTOGRAFT (Right) Up with therapy WBAT RLE, post hip precautions Improved Hgb today restarted Eliquis at 2.5mg  bid, recheck CBC in AM.   Elevated D-dimer will obtain LE dopplers to r/o DVT.   Incentive spirometer encourage use. If remains stable with counts possible d/c with HHPT tomorrow or Friday.  Will need cardiology rec's regarding home meds for d/c.  Also rec's to  determine need for further workup, SOB and tachycardia with activity, prior to d/c?  Margart Sickles 01/03/2013, 11:04 AM

## 2013-01-03 NOTE — Telephone Encounter (Signed)
Lm gv appts for 04/17/13 w/labs@ 2pm and ov@2 :30p. i made the pt aware that [facility] will call him w/ genetic appt. i also made the pt aware that i will mail a letter/cal...td

## 2013-01-03 NOTE — Progress Notes (Signed)
Occupational Therapy Treatment Patient Details Name: Martin [name] MRN: [id number] DOB: 09-24-1930 Today's Date: 01/03/2013 Time: 4098-1191 OT Time Calculation (min): 27 min  OT Assessment / Plan / Recommendation       OT comments  Pt. Agreeable to participation in skilled ot.  Able to complete sim. Toilet transfer, and actual shower stall transfer with min a.  Bed mobility more challenging for pt. With mod a required to bring rle into bed.  Pt. With periods of SOB after activity but initiates PLB tech. Independently along with rest breaks between tasks.  Plans to purchase a/e kit for lb selfcare  Follow Up Recommendations  No OT follow up;Supervision/Assistance - 24 hour                      Frequency Min 2X/week   Progress towards OT Goals Progress towards OT goals: Progressing toward goals  Plan Discharge plan remains appropriate    Precautions / Restrictions Precautions Precautions: Fall Precaution Comments: verbalized 2/3 hip precautions Restrictions RLE Weight Bearing: Weight bearing as tolerated   Pertinent Vitals/Pain 3/10    ADL  Grooming: Performed;Teeth care;Set up Where Assessed - Grooming: Supported sitting Toilet Transfer: Mining engineer Method: Sit to stand Toileting - Architect and Hygiene: Simulated;Minimal assistance Where Assessed - Engineer, mining and Hygiene: Standing Tub/Shower Transfer: Performed;Moderate assistance Tub/Shower Transfer Method: Anterior-posterior Tub/Shower Transfer Equipment: Shower seat with back Equipment Used: Rolling walker Transfers/Ambulation Related to ADLs: min a sit/stand, min guard for ambulation with cues for breathing tech. and energy conservation secondary to noted SOB ADL Comments: states he plans to purchase A/E kit but declines further practice stating he feels he know how to use it.  sim. shower stall transfer, will use 3n1 in shower stall for sitting  during bathing,     OT Goals(current goals can now be found in the care plan section)    Visit Information  Last OT Received On: 01/03/13                 Cognition  Cognition Arousal/Alertness: Awake/alert Behavior During Therapy: [facility] for tasks assessed/performed Overall Cognitive Status: Within Functional Limits for tasks assessed    Mobility  Bed Mobility Details for Bed Mobility Assistance: hob flat with no rails stand to sit eob with min a and mod inst. cues for hand placement and tech., sit/supine mod a to bring RLE into bed with mod inst. cues for bringing legs into bed and bringing trunk to center of bed Transfers Transfers: Sit to Stand;Stand to Sit Sit to Stand: 4: Min assist;With upper extremity assist;With armrests;From chair/3-in-1 Stand to Sit: 4: Min assist;With upper extremity assist;To bed Details for Transfer Assistance: cues for tech.                End of Session OT - End of Session Equipment Utilized During Treatment: Rolling walker Activity Tolerance: Patient tolerated treatment well Patient left: in bed;with call bell/phone within reach       Robet Leu, COTA/L 01/03/2013, 8:53 AM

## 2013-01-03 NOTE — Progress Notes (Signed)
Noted pt min guard assist with therapy. Patient prefers home with home health. [phone]

## 2013-01-04 LAB — BASIC METABOLIC PANEL
BUN: 34 mg/dL — ABNORMAL HIGH (ref 6–23)
CO2: 27 mEq/L (ref 19–32)
Calcium: 9.1 mg/dL (ref 8.4–10.5)
Chloride: 97 mEq/L (ref 96–112)
Creatinine, Ser: 1.49 mg/dL — ABNORMAL HIGH (ref 0.50–1.35)
GFR calc non Af Amer: 43 mL/min — ABNORMAL LOW (ref >90)
Glucose, Bld: 156 mg/dL — ABNORMAL HIGH (ref 70–99)
Potassium: 3.2 mEq/L — ABNORMAL LOW (ref 3.5–5.1)
Sodium: 138 mEq/L (ref 135–145)

## 2013-01-04 LAB — CBC WITH DIFFERENTIAL/PLATELET
Basophils Absolute: 0 10*3/uL (ref 0.0–0.1)
Eosinophils Absolute: 0.1 10*3/uL (ref 0.0–0.7)
Eosinophils Relative: 1 % (ref 0–5)
HCT: 28.9 % — ABNORMAL LOW (ref 39.0–52.0)
Hemoglobin: 9.8 g/dL — ABNORMAL LOW (ref 13.0–17.0)
Lymphocytes Relative: 8 % — ABNORMAL LOW (ref 12–46)
Lymphs Abs: 0.7 10*3/uL (ref 0.7–4.0)
MCH: 31.7 pg (ref 26.0–34.0)
MCV: 93.5 fL (ref 78.0–100.0)
Monocytes Absolute: 0.7 10*3/uL (ref 0.1–1.0)
Monocytes Relative: 8 % (ref 3–12)
Neutro Abs: 7.1 10*3/uL (ref 1.7–7.7)
Platelets: 90 10*3/uL — ABNORMAL LOW (ref 150–400)
RBC: 3.09 MIL/uL — ABNORMAL LOW (ref 4.22–5.81)
RDW: 16.6 % — ABNORMAL HIGH (ref 11.5–15.5)
WBC: 8.6 10*3/uL (ref 4.0–10.5)

## 2013-01-04 LAB — PROTIME-INR: Prothrombin Time: 17.7 seconds — ABNORMAL HIGH (ref 11.6–15.2)

## 2013-01-04 MED ORDER — APIXABAN 2.5 MG PO TABS: 2.5000 mg | ORAL_TABLET | Freq: Two times a day (BID) | ORAL | Status: DC

## 2013-01-04 MED ORDER — POTASSIUM CHLORIDE CRYS ER 20 MEQ PO TBCR
20.0000 meq | EXTENDED_RELEASE_TABLET | Freq: Once | ORAL | Status: AC
  Administered 2013-01-04: 20 meq via ORAL
  Filled 2013-01-04: qty 1

## 2013-01-04 MED ORDER — METOPROLOL TARTRATE 50 MG PO TABS
50.0000 mg | ORAL_TABLET | Freq: Two times a day (BID) | ORAL | Status: DC
  Administered 2013-01-04 – 2013-01-05 (×3): 50 mg via ORAL
  Filled 2013-01-04 (×4): qty 1

## 2013-01-04 MED ORDER — HYDROCODONE-ACETAMINOPHEN 5-325 MG PO TABS: 1.0000 | ORAL_TABLET | ORAL | Status: DC | PRN

## 2013-01-04 MED ORDER — SORBITOL 70 % SOLN
30.0000 mL | Freq: Every day | Status: DC | PRN
  Administered 2013-01-04: 30 mL via ORAL
  Filled 2013-01-04: qty 30

## 2013-01-04 NOTE — Progress Notes (Signed)
Subjective: 6 Days Post-Op Procedure(s) (LRB): TOTAL HIP ARTHROPLASTY WITH AUTOGRAFT (Right) Patient reports pain as mild.  Cardiology adjusting BP meds recommend another overnight to monitor.    Objective: Vital signs in last 24 hours: Temp:  [97.8 F (36.6 C)-99.3 F (37.4 C)] 99.3 F (37.4 C) (10/16 1457) Pulse Rate:  [84-121] 85 (10/16 1457) Resp:  [16-18] 18 (10/16 1457) BP: (118-137)/(73-84) 118/73 mmHg (10/16 1457) SpO2:  [94 %-96 %] 96 % (10/16 1457)  Intake/Output from previous day: 10/15 0701 - 10/16 0700 In: 1465 [P.O.:1440] Out: 1250 [Urine:1250] Intake/Output this shift: Total I/O In: 530 [P.O.:480; I.V.:50] Out: 200 [Urine:200]   Recent Labs  01/02/13 0425 01/03/13 0545 01/04/13 1035  HGB 9.0* 9.4* 9.8*    Recent Labs  01/03/13 0545 01/04/13 1035  WBC 7.2 8.6  RBC 2.95* 3.09*  HCT 27.6* 28.9*  PLT 74* 90*    Recent Labs  01/03/13 0545 01/04/13 1035  NA 137 138  K 3.5 3.2*  CL 99 97  CO2 24 27  BUN 38* 34*  CREATININE 1.71* 1.49*  GLUCOSE 127* 156*  CALCIUM 8.4 9.1    Recent Labs  01/02/13 0940 01/04/13 1035  INR 1.37 1.50*    Neurovascular intact Sensation intact distally Intact pulses distally Dorsiflexion/Plantar flexion intact Incision: dressing C/D/I No cellulitis present  Assessment/Plan: 6 Days Post-Op Procedure(s) (LRB): TOTAL HIP ARTHROPLASTY WITH AUTOGRAFT (Right) Up with therapy Plan for discharge tomorrow If stable overnight on home BP meds would plan on d/c home with HHPT dvt proph 2.5mg  Eliquis bid Pain control norco WBAT RLE post hip precautions   Ehsan Corvin 01/04/2013, 4:54 PM

## 2013-01-04 NOTE — Progress Notes (Signed)
Physical Therapy Treatment Patient Details Name: Martin [name] MRN: [id number] DOB: 08/01/1930 Today's Date: 01/04/2013 Time: 5643-3295 PT Time Calculation (min): 28 min  PT Assessment / Plan / Recommendation  History of Present Illness Pt underwent posterior rt THA.  Pt with post-op hypotension requiring pressors and ICU monitoring.   PT Comments   Pt cont's to make progress with mobility.  Increased ambulation distance & performed steps this session.  Per rehab admission progress note, pt too high level for CIR & pt requesting to d/c home when medically ready.  D/c plans updated to HHPT & 24 hr assist/supervision.     Follow Up Recommendations  Home health PT;Supervision/Assistance - 24 hour     Does the patient have the potential to tolerate intense rehabilitation     Barriers to Discharge        Equipment Recommendations  None recommended by PT    Recommendations for Other Services    Frequency 7X/week   Progress towards PT Goals Progress towards PT goals: Progressing toward goals  Plan Discharge plan needs to be updated    Precautions / Restrictions Precautions Precautions: Fall Required Braces or Orthoses: Knee Immobilizer - Right Knee Immobilizer - Right: Other (comment) (as needed for THA precautions) Restrictions RLE Weight Bearing: Weight bearing as tolerated   Pertinent Vitals/Pain "it's sore" Rt hip.  Repositioned for comfort.      Mobility  Bed Mobility Bed Mobility: Supine to Sit;Sitting - Scoot to Edge of Bed Supine to Sit: 5: Supervision;HOB flat Sitting - Scoot to Edge of Bed: 5: Supervision Details for Bed Mobility Assistance: Incr time.  No physical (A) needed.   Transfers Transfers: Sit to Stand;Stand to Sit Sit to Stand: 4: Min guard;With upper extremity assist;From bed Stand to Sit: With upper extremity assist;With armrests;To chair/3-in-1;4: Min guard Details for Transfer Assistance: cues for hand placement & use of UE's to control descent.   Ambulation/Gait Ambulation/Gait Assistance: 4: Min guard Ambulation Distance (Feet): 100 Feet Assistive device: Rolling walker Ambulation/Gait Assistance Details: Cues to look ahead for increased awareness of environment.   Gait Pattern: Step-through pattern;Decreased stride length;Decreased weight shift to right;Decreased step length - left Stairs: Yes Stairs Assistance: 4: Min assist;4: Min guard Stairs Assistance Details (indicate cue type and reason): Practiced 2 different ways due to unable to get clear picture of how steps are at home.  Practiced platform step due to pt initially stating he could put entire walker onto 1 step at a time then practiced backwards technique due to he then stating he would be unable to put walker onto 1 step.  Cues for sequencing & technique.  Handout provided & placed in pt's room for backwards technique with RW.   Stair Management Technique: No rails;Step to pattern;Forwards;Backwards;With walker Number of Stairs:  (1 fowards; 6 backwards) Wheelchair Mobility Wheelchair Mobility: No      PT Goals (current goals can now be found in the care plan section) Acute Rehab PT Goals PT Goal Formulation: With patient Time For Goal Achievement: 01/06/13 Potential to Achieve Goals: Good  Visit Information  Last PT Received On: 01/04/13 Assistance Needed: +1 History of Present Illness: Pt underwent posterior rt THA.  Pt with post-op hypotension requiring pressors and ICU monitoring.    Subjective Data      Cognition  Cognition Arousal/Alertness: Awake/alert Behavior During Therapy: WFL for tasks assessed/performed Overall Cognitive Status: Within Functional Limits for tasks assessed    Balance     End of Session PT - End of  Session Equipment Utilized During Treatment: Gait belt Activity Tolerance: Patient tolerated treatment well Patient left: in chair;with call bell/phone within reach Nurse Communication: Mobility status   GP     Lara Mulch 01/04/2013, 10:19 AM  Verdell Face, PTA [phone] 01/04/2013

## 2013-01-04 NOTE — Progress Notes (Signed)
ANTICOAGULATION CONSULT NOTE - Follow-up  Pharmacy Consult for Apixaban Indication: atrial fibrillation  No Known Allergies  Patient Measurements: Height: 5\' 8"  (172.7 cm) Weight: 218 lb 7.6 oz (99.1 kg) IBW/kg (Calculated) : 68.4  Labs:  Recent Labs  01/02/13 0425 01/02/13 0940 01/03/13 0545 01/04/13 1035  HGB 9.0*  --  9.4* 9.8*  HCT 25.9*  --  27.6* 28.9*  PLT 52* 53* 74* 90*  APTT  --  35  --   --   LABPROT  --  16.5*  --  17.7*  INR  --  1.37  --  1.50*  CREATININE 1.94*  --  1.71* 1.49*    Estimated Creatinine Clearance: 45.1 ml/min (by C-G formula based on Cr of 1.49).  Assessment: 79 year old male s/p hip arthroplasty.  He was on apixaban PTA for Afib at a dose of 5 mg po BID. Dose was reduced due to Scr elevation and age. Scr is now improving but remains borderline with Scr of 1.49.   Goal of Therapy:  Appropriate dosing Monitor platelets by anticoagulation protocol: Yes   Plan:  1. Continue apixaban 2.5mg  BID - if SCr continues to fall, would increase back up to 5mg  BID 2. F/u renal fxn and S&S of bleeding  Lysle Pearl, PharmD, BCPS Pager # [phone] 01/04/2013 2:21 PM

## 2013-01-04 NOTE — Progress Notes (Signed)
Subjective:  C/o mild hip pain.  Not SOB.  NO chest pain.  Objective:  Vital Signs in the last 24 hours: BP 137/84  Pulse 110  Temp(Src) 98.8 F (37.1 C) (Oral)  Resp 18  Ht 5\' 8"  (1.727 m)  Wt 99.1 kg (218 lb 7.6 oz)  BMI 33.23 kg/m2  SpO2 96%  Physical Exam: Pleasant WM in NAD Lungs:  Clear  Cardiac:  irregular rhythm, normal S1 and S2, no S3 Abdomen:  Soft, nontender, no masses Extremities:  No edema present  Intake/Output from previous day: 10/15 0701 - 10/16 0700 In: 1465 [P.O.:1440] Out: 1250 [Urine:1250] Weight Filed Weights   12/29/12 1432 01/01/13 0500  Weight: 96.6 kg (212 lb 15.4 oz) 99.1 kg (218 lb 7.6 oz)    Lab Results: Basic Metabolic Panel:  Recent Labs  40/98/11 0425 01/03/13 0545  NA 134* 137  K 3.7 3.5  CL 98 99  CO2 27 24  GLUCOSE 117* 127*  BUN 38* 38*  CREATININE 1.94* 1.71*    CBC:  Recent Labs  01/02/13 0425 01/02/13 0940 01/03/13 0545  WBC 7.3  --  7.2  NEUTROABS 5.8  --  5.6  HGB 9.0*  --  9.4*  HCT 25.9*  --  27.6*  MCV 93.5  --  93.6  PLT 52* 53* 74*    BNP    Component Value Date/Time   PROBNP 12411.0* 12/31/2012 0358    PROTIME: Lab Results  Component Value Date   INR 1.37 01/02/2013   INR 1.64* 01/01/2013   INR 1.42 12/29/2012    Telemetry: Atrial fib rate still rapid at times  Assessment/Plan: 1. Acute on chronic CHF 2. Hypotension secondary to volume loss 3. Atrial fib with RVR 4. Thrombocytopenia multifactorial - see ocnsult note by HEme  Rec:  Go back to home dose of metoprolol and now that renal function has stabilized go on lower dose of Eliquus. Resume most home meds.  Hols valsartan for now until BP better.  Darden Palmer  MD [facility]  01/04/2013, 9:24 AM

## 2013-01-05 ENCOUNTER — Encounter (HOSPITAL_COMMUNITY): Payer: Self-pay | Admitting: Cardiology

## 2013-01-05 DIAGNOSIS — E785 Hyperlipidemia, unspecified: Secondary | ICD-10-CM | POA: Insufficient documentation

## 2013-01-05 LAB — BASIC METABOLIC PANEL
BUN: 30 mg/dL — ABNORMAL HIGH (ref 6–23)
CO2: 27 mEq/L (ref 19–32)
Calcium: 8.6 mg/dL (ref 8.4–10.5)
Chloride: 98 mEq/L (ref 96–112)
Creatinine, Ser: 1.41 mg/dL — ABNORMAL HIGH (ref 0.50–1.35)
GFR calc Af Amer: 53 mL/min — ABNORMAL LOW (ref >90)
Sodium: 137 mEq/L (ref 135–145)

## 2013-01-05 LAB — CBC WITH DIFFERENTIAL/PLATELET
Basophils Absolute: 0 10*3/uL (ref 0.0–0.1)
Basophils Relative: 0 % (ref 0–1)
Eosinophils Absolute: 0.1 10*3/uL (ref 0.0–0.7)
Eosinophils Relative: 1 % (ref 0–5)
HCT: 28.8 % — ABNORMAL LOW (ref 39.0–52.0)
Hemoglobin: 9.8 g/dL — ABNORMAL LOW (ref 13.0–17.0)
Lymphocytes Relative: 7 % — ABNORMAL LOW (ref 12–46)
MCH: 32 pg (ref 26.0–34.0)
MCHC: 34 g/dL (ref 30.0–36.0)
MCV: 94.1 fL (ref 78.0–100.0)
Monocytes Absolute: 0.9 10*3/uL (ref 0.1–1.0)
Monocytes Relative: 12 % (ref 3–12)
Neutro Abs: 6.1 10*3/uL (ref 1.7–7.7)
Neutrophils Relative %: 80 % — ABNORMAL HIGH (ref 43–77)
Platelets: 96 10*3/uL — ABNORMAL LOW (ref 150–400)
RDW: 16.5 % — ABNORMAL HIGH (ref 11.5–15.5)

## 2013-01-05 LAB — HEPARIN INDUCED THROMBOCYTOPENIA PNL
UFH High Dose UFH H: 1 % Release
UFH Low Dose 0.1 IU/mL: 2 % Release
UFH Low Dose 0.5 IU/mL: 1 % Release
UFH SRA Result: NEGATIVE

## 2013-01-05 MED ORDER — POTASSIUM CHLORIDE 20 MEQ/15ML (10%) PO LIQD
40.0000 meq | Freq: Once | ORAL | Status: AC
  Administered 2013-01-05: 40 meq via ORAL
  Filled 2013-01-05: qty 30

## 2013-01-05 MED ORDER — TERAZOSIN HCL 5 MG PO CAPS
5.0000 mg | ORAL_CAPSULE | Freq: Every day | ORAL | Status: DC
  Filled 2013-01-05: qty 1

## 2013-01-05 MED ORDER — TERAZOSIN HCL 5 MG PO CAPS: 5.0000 mg | ORAL_CAPSULE | Freq: Every day | ORAL | Status: DC

## 2013-01-05 MED ORDER — IRBESARTAN 75 MG PO TABS
75.0000 mg | ORAL_TABLET | Freq: Every day | ORAL | Status: DC
  Administered 2013-01-05: 75 mg via ORAL
  Filled 2013-01-05: qty 1

## 2013-01-05 MED ORDER — VALSARTAN 80 MG PO TABS: 80.0000 mg | ORAL_TABLET | Freq: Every day | ORAL | Status: DC

## 2013-01-05 NOTE — Progress Notes (Signed)
Notified IV team to d/c central line.  They will be here as soon as they can.

## 2013-01-05 NOTE — Discharge Summary (Signed)
PATIENT ID: Martin [name]        MRN:  [id number]          DOB/AGE: 1930/06/28 / 79 y.o.    DISCHARGE SUMMARY  ADMISSION DATE:    12/29/2012 DISCHARGE DATE:   01/05/2013   ADMISSION DIAGNOSIS: OA R HIP    DISCHARGE DIAGNOSIS:  OA R HIP    ADDITIONAL DIAGNOSIS: Principal Problem:   Hypotension Active Problems:   Chronic atrial fibrillation   Chronic systolic heart failure   CAD (coronary artery disease)   Long-term (current) use of anticoagulants   Shock circulatory  Past Medical History  Diagnosis Date  . Gout   . Hypertension   . Osteoarthritis   . Cardiomyopathy     ischemic  . Coronary artery disease   . Old anterior wall myocardial infarction   . 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 Atrial fibrillation   . Automatic implantable cardioverter-defibrillator in situ     dr Ladona Ridgel    PROCEDURE: Procedure(s): TOTAL HIP ARTHROPLASTY WITH AUTOGRAFT Right on 12/29/2012  CONSULTS: Cardiology, critical care, heme/onc, PT, OT  Treatment Team:  Othella Boyer, MD Lowella Dell, MD   HISTORY:  See H&P in chart  HOSPITAL COURSE:  Martin [name] is a 79 y.o. admitted on 12/29/2012 and found to have a diagnosis of OA R HIP.  After appropriate laboratory studies were obtained  they were taken to the operating room on 12/29/2012 and underwent  Procedure(s): TOTAL HIP ARTHROPLASTY WITH AUTOGRAFT  Right.   They were given perioperative antibiotics:  Anti-infectives   Start     Dose/Rate Route Frequency Ordered Stop   12/29/12 1600  ceFAZolin (ANCEF) IVPB 2 g/50 mL premix     2 g 100 mL/hr over 30 Minutes Intravenous Every 6 hours 12/29/12 1449 12/29/12 2208   12/29/12 0600  ceFAZolin (ANCEF) IVPB 2 g/50 mL premix     2 g 100 mL/hr over 30 Minutes Intravenous On call to O.R. 12/28/12 1349 12/29/12 0735    .  Tolerated the procedure well.  Placed with a foley intraoperatively.  EBL approx 1L had postop hypotension, cardiology consulted and pt placed in SICU  critical care consulted, transfused one unit PRBC and platelets intraop.    POD #1, allowed out of bed to a chair.  PT for ambulation and exercise program.  IV saline locked.  O2 discontionued.  POD #2, continued PT and ambulation.   Hemovac pulled. Marland Kitchen POD#3, pt stabalized in SICU transfused 2 more units PRBC low hgb,  The remainder of the hospital course was dedicated to ambulation and strengthening.   Cardiology continued to see and make adjustments to home meds to assure pt was stable with changes prior to discharge.  The patient was discharged on 7 Days Post-Op in  Stable condition.  Blood products given:4 units CC PRBC, 2 units FFP and 2 units PLTS  DIAGNOSTIC STUDIES: Recent vital signs: Patient Vitals for the past 24 hrs:  BP Temp Temp src Pulse Resp SpO2  01/05/13 0552 131/86 mmHg 98 F (36.7 C) - 97 18 95 %  01/04/13 2053 128/79 mmHg 98.6 F (37 C) - 96 18 94 %  01/04/13 1600 - - - - 18 -  01/04/13 1457 118/73 mmHg 99.3 F (37.4 C) - 85 18 96 %  01/04/13 1200 - - - - 16 -  01/04/13 0950 135/78 mmHg 97.8 F (36.6 C) Oral 121 - 94 %  01/04/13 0800 - - - - 18 94 %  Recent laboratory studies:  Recent Labs  12/30/12 0940 12/30/12 1542 12/31/12 0358 01/01/13 0400 01/02/13 0425 01/02/13 0940 01/03/13 0545 01/04/13 1035  WBC 15.4* 12.9* 14.1* 10.4 7.3  --  7.2 8.6  HGB 8.7* 8.5* 8.5* 8.0* 9.0*  --  9.4* 9.8*  HCT 24.8* 24.4* 24.6* 23.3* 25.9*  --  27.6* 28.9*  PLT 75* 57* 54* 50* 52* 53* 74* 90*    Recent Labs  12/29/12 1500 12/30/12 0300 12/31/12 0358 01/01/13 0400 01/02/13 0425 01/03/13 0545 01/04/13 1035  NA 140 140 136 135 134* 137 138  K 3.7 3.8 3.4* 3.7 3.7 3.5 3.2*  CL 102 102 99 99 98 99 97  CO2 27 26 26 27 27 24 27   BUN 26* 26* 31* 35* 38* 38* 34*  CREATININE 1.62* 1.76* 2.03* 2.00* 1.94* 1.71* 1.49*  GLUCOSE 142* 191* 136* 136* 117* 127* 156*  CALCIUM 8.1* 7.5* 8.0* 8.1* 8.3* 8.4 9.1   Lab Results  Component Value Date   INR 1.50*  01/04/2013   INR 1.37 01/02/2013   INR 1.64* 01/01/2013     Recent Radiographic Studies :  Dg Chest 2 View  12/21/2012   CLINICAL DATA:  Pre-admission for hip replacement  EXAM: CHEST  2 VIEW  COMPARISON:  11/23/2007  FINDINGS: Cardiomediastinal silhouette is stable. Single lead cardiac pacemaker is unchanged in position. Surgical clips in left axilla again noted. No acute infiltrate or pulmonary edema. Stable minimal degenerative changes thoracic spine.  IMPRESSION: No active cardiopulmonary disease.  No significant change.   Electronically Signed   By: Natasha Mead   On: 12/21/2012 12:12   Dg Hip Operative Right  12/29/2012   CLINICAL DATA:  Total hip arthroplasty  EXAM: DG OPERATIVE RIGHT HIP  TECHNIQUE: A single spot fluoroscopic AP image of the right hip is submitted.  COMPARISON:  None.  FINDINGS: The right femoral neck is not clearly visualized. The proximal femur is noted. A prosthetic device is not visualized.  IMPRESSION: Absence of the femoral head.   Electronically Signed   By: Maryclare Bean M.D.   On: 12/29/2012 10:24   Dg Pelvis Portable  12/29/2012   *RADIOLOGY REPORT*  Clinical Data: Postop right total hip replacement, initial encounter.  PORTABLE PELVIS  Comparison: None  Findings:  The patient undergone a right total hip replacement.  Alignment appears near anatomic.  No evidence of hardware failure or loosening.  Skin staples overlie the upper outer aspect of the thigh.  There are expected scattered foci of subcutaneous emphysema about the operative site.  No radiopaque foreign body.  No fracture.  Limited visualization of the contralateral left total hip replacement is normal.  IMPRESSION: Post right total hip replacement without evidence of complication.   Original Report Authenticated By: Tacey Ruiz, MD   Dg Chest Port 1 View  12/30/2012   CLINICAL DATA:  Evaluate for airspace disease.  EXAM: PORTABLE CHEST - 1 VIEW  COMPARISON:  12/29/2012  FINDINGS: Reticular densities in noted  in the left lower lung zone, likely atelectasis. There is no convincing pneumonia. No pulmonary edema.  There is no pneumothorax. Changes from cardiac surgery are stable. Mild cardiomegaly is stable.  Right internal jugular central venous line has its tip just above the caval atrial junction, also unchanged.  IMPRESSION: 1. Left basilar opacity is likely atelectasis. No convincing pneumonia and no evidence of pulmonary edema. 2. No change from the previous day's study.   Electronically Signed   By: Renard Hamper.D.  On: 12/30/2012 07:42   Dg Chest Port 1 View  12/29/2012   CLINICAL DATA:  Pulmonary edema.  EXAM: PORTABLE CHEST - 1 VIEW  COMPARISON:  Same day.  FINDINGS: Stable cardiomegaly. Sternotomy wires are noted. Left-sided pacemaker is unchanged in position. Stable central pulmonary vascular congestion. No acute pulmonary disease or edema is noted.  IMPRESSION: Stable cardiomegaly. No definite evidence of pulmonary edema seen currently.   Electronically Signed   By: Roque Lias M.D.   On: 12/29/2012 20:31   Dg Chest Port 1 View  12/29/2012   *RADIOLOGY REPORT*  Clinical Data: Post right IJ line placement, subsequent encounter.  PORTABLE CHEST - 1 VIEW  Comparison: 12/21/2012  Findings:  Grossly unchanged enlarged cardiac silhouette and mediastinal contours post median sternotomy.  Interval placement of a right jugular approach central venous catheter with tip overlying the superior cavoatrial junction.  Stable positioning of left anterior chest wall single lead AICD / pacemaker.  Mild pulmonary venous congestion without frank evidence of edema.  Slight worsening of bilateral infrahilar opacities favored to represent atelectasis. No definite pleural effusion.  Grossly unchanged bones.  Mild gaseous distension of the stomach.  IMPRESSION: 1.  Interval placement of right jugular approach central venous catheter with tip overlying the superior cavoatrial junction.  No definite pneumothorax. 2.   Cardiomegaly and pulmonary venous congestion without frank evidence of edema. 3.  Slight worsening of perihilar atelectasis.   Original Report Authenticated By: Tacey Ruiz, MD   Dg Hip Portable 1 View Right  12/29/2012   *RADIOLOGY REPORT*  Clinical Data: Post right total hip replacement  PORTABLE RIGHT HIP - 1 VIEW  Comparison: Earlier same day  Findings:  This examination is interpreted in conjunction with AP pelvis radiograph performed earlier same day.  The patient has undergone a right total hip replacement.  Alignment appears near anatomic.  No evidence of hardware failure or loosening.  No fracture.  Skin staples overlie the upper outer aspect of the right thigh.  There are expected scattered foci of subcutaneous emphysema about the operative site.  No radiopaque foreign body.  IMPRESSION: Post right total hip replacement without evidence of complication.   Original Report Authenticated By: Tacey Ruiz, MD    DISCHARGE INSTRUCTIONS:  Future Appointments Provider Department Dept Phone   04/17/2013 2:00 PM Krista [facility] MEDICAL ONCOLOGY [phone]   04/17/2013 2:30 PM Lowella Dell, MD [facility] MEDICAL ONCOLOGY [phone]      DISCHARGE MEDICATIONS:     Medication List    STOP taking these medications       allopurinol 300 MG tablet  Commonly known as:  ZYLOPRIM     terazosin 5 MG capsule  Commonly known as:  HYTRIN     valsartan 80 MG tablet  Commonly known as:  DIOVAN      TAKE these medications       apixaban 2.5 MG Tabs tablet  Commonly known as:  ELIQUIS  Take 1 tablet (2.5 mg total) by mouth 2 (two) times daily.     colchicine 0.6 MG tablet  Take 0.6 mg by mouth daily.     ferrous sulfate 325 (65 FE) MG tablet  Take 325 mg by mouth daily with breakfast.     furosemide 40 MG tablet  Commonly known as:  LASIX  Take 40 mg by mouth 2 (two) times daily.     HYDROcodone-acetaminophen 5-325 MG per tablet   Commonly known as:  NORCO/VICODIN  Take 1-2 tablets by mouth every 4 (four) hours as needed.     metoprolol 50 MG tablet  Commonly known as:  LOPRESSOR  Take 50 mg by mouth 2 (two) times daily.     multivitamin per tablet  Take 1 tablet by mouth daily.     potassium chloride SA 20 MEQ tablet  Commonly known as:  K-DUR,KLOR-CON  Take 20 mEq by mouth daily.     rosuvastatin 20 MG tablet  Commonly known as:  CRESTOR  Take 20 mg by mouth daily.     vitamin C 500 MG tablet  Commonly known as:  ASCORBIC ACID  Take 500 mg by mouth daily.        FOLLOW UP VISIT:       Follow-up Information   Follow up with CAFFREY JR,W D, MD. Schedule an appointment as soon as possible for a visit in 2 weeks.   Specialty:  Orthopedic Surgery   Contact information:   [address] [place] [address] [phone]       Follow up with [facility]. (Home Health Physical Therapy)    Contact information:   [phone]      DISPOSITION:   Home  CONDITION:  Stable   Ameliyah Sarno 01/05/2013, 7:50 AM

## 2013-01-05 NOTE — Progress Notes (Signed)
Subjective:  Feeling well today without shortness of breath. Atrial fibrillation rate better controlled with increasing metoprolol dose. No chest pain. From a cardiac viewpoint will be fine for discharge.  Objective:  Vital Signs in the last 24 hours: BP 131/86  Pulse 97  Temp(Src) 98 F (36.7 C) (Oral)  Resp 18  Ht 5\' 8"  (1.727 m)  Wt 99.1 kg (218 lb 7.6 oz)  BMI 33.23 kg/m2  SpO2 95%  Physical Exam: Pleasant WM in NAD Lungs:  Clear  Cardiac:  irregular rhythm, normal S1 and S2, no S3 Abdomen:  Soft, nontender, no masses Extremities:  No edema present  Intake/Output from previous day: 10/16 0701 - 10/17 0700 In: 650 [P.O.:600; I.V.:50] Out: 1100 [Urine:1100] Weight Filed Weights   12/29/12 1432 01/01/13 0500  Weight: 96.6 kg (212 lb 15.4 oz) 99.1 kg (218 lb 7.6 oz)    Lab Results: Basic Metabolic Panel:  Recent Labs  16/10/96 0545 01/04/13 1035  NA 137 138  K 3.5 3.2*  CL 99 97  CO2 24 27  GLUCOSE 127* 156*  BUN 38* 34*  CREATININE 1.71* 1.49*    CBC:  Recent Labs  01/03/13 0545 01/04/13 1035  WBC 7.2 8.6  NEUTROABS 5.6 7.1  HGB 9.4* 9.8*  HCT 27.6* 28.9*  MCV 93.6 93.5  PLT 74* 90*    BNP    Component Value Date/Time   PROBNP 12411.0* 12/31/2012 0358    PROTIME: Lab Results  Component Value Date   INR 1.50* 01/04/2013   INR 1.37 01/02/2013   INR 1.64* 01/01/2013    Telemetry: Atrial fib rate still elevated at times but better than yesterday.  Assessment/Plan: 1. Acute on chronic CHF-resolved 2. Hypotension secondary to volume loss-resolved 3. Atrial fib with RVR is better controlled 4. Thrombocytopenia multifactorial -back to baseline 5. Ischemic cardiomyopathy 6. Stage III chronic kidney disease  Rec:  From cardiac viewpoint may go home today. He may go back home on terazosin and valsartan. I will need to see back in 2 weeks. His Eliquus dose should be reduced to 2.5 mg twice a day when he goes home.  Martin Palmer   MD [facility]  01/05/2013, 8:16 AM

## 2013-01-05 NOTE — Progress Notes (Signed)
Physical Therapy Treatment Patient Details Name: Martin [name] MRN: [id number] DOB: May 23, 1930 Today's Date: 01/05/2013 Time: 4098-1191 PT Time Calculation (min): 29 min  PT Assessment / Plan / Recommendation  History of Present Illness Pt underwent posterior rt THA.  Pt with post-op hypotension requiring pressors and ICU monitoring.   PT Comments   Pt progressing well towards physical therapy goals. Pt reports comfort with stairs and does not want to practice them again during session despite encouragement. HEP given and reviewed with pt during session.  Follow Up Recommendations  Home health PT;Supervision/Assistance - 24 hour     Does the patient have the potential to tolerate intense rehabilitation     Barriers to Discharge        Equipment Recommendations  None recommended by PT    Recommendations for Other Services    Frequency 7X/week   Progress towards PT Goals Progress towards PT goals: Progressing toward goals  Plan Current plan remains appropriate    Precautions / Restrictions Precautions Precautions: Fall Precaution Comments: Verbalized 3/3 hip precautions Restrictions Weight Bearing Restrictions: Yes RLE Weight Bearing: Weight bearing as tolerated   Pertinent Vitals/Pain 3/10 at rest, 6/10 during activity. Nursing notified.    Mobility  Bed Mobility Bed Mobility: Not assessed Details for Bed Mobility Assistance: Pt received in recliner. Transfers Transfers: Sit to Stand;Stand to Sit Sit to Stand: 5: Supervision;From chair/3-in-1;With upper extremity assist Stand to Sit: 5: Supervision;To chair/3-in-1;With upper extremity assist Details for Transfer Assistance: VC's for hand placement and for controlled descent to chair. Pt maintained hip precautions well. Ambulation/Gait Ambulation/Gait Assistance: 4: Min guard Ambulation Distance (Feet): 100 Feet Assistive device: Rolling walker Ambulation/Gait Assistance Details: VC's for fluidity of walker movement,  increased step/stride on L for step-through gait pattern, and increased heel strike. Gait Pattern: Step-through pattern;Decreased stride length;Narrow base of support Gait velocity: decreased Stairs: No Stairs Assistance Details (indicate cue type and reason): Pt reports he feels comfortable negotiating the steps from yesterday's practice with PT and refuses to attempt them again today.    Exercises Total Joint Exercises Ankle Circles/Pumps: 15 reps;Both Quad Sets: 15 reps;Both Towel Squeeze: 15 reps;Both Heel Slides: 15 reps;Right Hip ABduction/ADduction: 15 reps;Right Straight Leg Raises: 15 reps;Right;AAROM Long Arc Quad: 15 reps;Right   PT Diagnosis:    PT Problem List:   PT Treatment Interventions:     PT Goals (current goals can now be found in the care plan section) Acute Rehab PT Goals Patient Stated Goal: return home PT Goal Formulation: With patient Time For Goal Achievement: 01/06/13 Potential to Achieve Goals: Good  Visit Information  Last PT Received On: 01/05/13 Assistance Needed: +1 History of Present Illness: Pt underwent posterior rt THA.  Pt with post-op hypotension requiring pressors and ICU monitoring.    Subjective Data  Subjective: "It feels good when I'm not moving it." Patient Stated Goal: return home   Cognition  Cognition Arousal/Alertness: Awake/alert Behavior During Therapy: WFL for tasks assessed/performed Overall Cognitive Status: Within Functional Limits for tasks assessed    Balance     End of Session PT - End of Session Equipment Utilized During Treatment: Gait belt Activity Tolerance: Patient tolerated treatment well Patient left: in chair;with call bell/phone within reach Nurse Communication: Mobility status   GP     Ruthann Cancer 01/05/2013, 12:26 PM  Ruthann Cancer, PT, DPT Acute Rehabilitation Services [phone]

## 2013-01-05 NOTE — Progress Notes (Signed)
  COURTESY NOTE:  Patient's platelets are back to baseline in the 90K range. He has a follow-up appointment with me JAN 27/ 2015 and will be contacted by out geneticisdt to arrange for a BRCA test prior to that visit. Martin [name] is aware of these plans, which are also being provided to him by mail.  Will sign off at this time

## 2013-01-09 ENCOUNTER — Telehealth: Payer: Self-pay | Admitting: *Deleted

## 2013-01-09 NOTE — Telephone Encounter (Signed)
Left message for pt to return my call so I can schedule a genetic appt.  

## 2013-01-11 ENCOUNTER — Telehealth: Payer: Self-pay | Admitting: *Deleted

## 2013-01-11 NOTE — Telephone Encounter (Signed)
Pt's wife Lanora Manis) called me and I confirmed 01/25/13 genetic appt w/ her.  Emailed Clydie Braun to make her aware.

## 2013-01-25 ENCOUNTER — Ambulatory Visit (HOSPITAL_BASED_OUTPATIENT_CLINIC_OR_DEPARTMENT_OTHER): Payer: Medicare Other | Admitting: Genetic Counselor

## 2013-01-25 ENCOUNTER — Other Ambulatory Visit: Payer: Medicare Other

## 2013-01-25 DIAGNOSIS — Z853 Personal history of malignant neoplasm of breast: Secondary | ICD-10-CM

## 2013-01-25 DIAGNOSIS — Z803 Family history of malignant neoplasm of breast: Secondary | ICD-10-CM

## 2013-01-25 DIAGNOSIS — C50929 Malignant neoplasm of unspecified site of unspecified male breast: Secondary | ICD-10-CM

## 2013-01-25 DIAGNOSIS — IMO0002 Reserved for concepts with insufficient information to code with codable children: Secondary | ICD-10-CM

## 2013-01-25 DIAGNOSIS — Z800 Family history of malignant neoplasm of digestive organs: Secondary | ICD-10-CM

## 2013-01-29 ENCOUNTER — Encounter: Payer: Self-pay | Admitting: Genetic Counselor

## 2013-01-29 NOTE — Progress Notes (Signed)
Dr. Raymond Gurney Magrinat requested a consultation for genetic counseling and risk assessment for Martin [name], a 79 y.o. male, for discussion of his personal history of breast cancer and family history of breast, colon and cancer-NOS.  He presents to clinic today to discuss the possibility of a genetic predisposition to cancer, and to further clarify his risks, as well as his family members' risks for cancer.   HISTORY OF PRESENT ILLNESS: Martin [name] is a 79 y.o. male with no personal history of cancer.    Past Medical History  Diagnosis Date  . Gout   . Hypertension   . Old anterior wall myocardial infarction   . Thrombocytopenia, immune   . Atrial fibrillation   . Hyperlipidemia   . ICD-Medtronic 11/25/2008    09/28/2007  Medtronic Sprint Quattro Secure S model 1610-96 (serial # [id number])                 Medtronic Virtuoso VR model D15VWC (serial # [id number]) single chamber ICD   . CAD (coronary artery disease)     1985 Anterior MI 1985 with LIMA to LAD [facility] 1995 PTCA to RCA x2 July 2009 Cath EF 20-25%  Patent LIMA to LAD, occluded LAD, Mild to moderate disease in RCA and Circ July 2014 Lexiscan Myoview  EF 32% anterior MI, no ischemia    . History of breast cancer in male   . Ischemic cardiomyopathy   . Osteoarthritis   . Breast cancer     dx at age 89    Past Surgical History  Procedure Laterality Date  . Mastectomy, partial      left breast 1991  . Coronary artery bypass graft      LIMA to LAD in June 1985  . Eye surgery    . Joint replacement      bil  . Hip arthroplasty    . Cardiac catheterization      x4  . Total hip arthroplasty Right 12/29/2012    Procedure: TOTAL HIP ARTHROPLASTY WITH AUTOGRAFT;  Surgeon: Thera Flake., MD;  Location: [facility] OR;  Service: Orthopedics;  Laterality: Right;    History   Social History  . Marital Status: Married    Spouse Name: N/A    Number of Children: 3  . Years of Education: N/A   Social History Main Topics  .  Smoking status: Former Smoker -- 2.00 packs/day for 10 years    Types: Cigarettes    Quit date: 03/22/1964  . Smokeless tobacco: Not on file  . Alcohol Use: Yes  . Drug Use: Not on file  . Sexual Activity: Not on file   Other Topics Concern  . Not on file   Social History Narrative  . No narrative on file    FAMILY HISTORY:  We obtained a detailed, 4-generation family history.  Significant diagnoses are listed below: Family History  Problem Relation Age of Onset  . Heart attack Father   . Cancer Mother     dx in her 26s, ended up in her bones  . Cancer Brother     ended up in his bones  . Breast cancer Paternal Aunt     died in her late 39s  . Lung cancer Paternal Uncle   . Cancer Maternal Grandmother     ended up in her bones  . Colon cancer Paternal Grandmother     Patient's maternal ancestors are of [place] descent, and paternal ancestors are of [place] descent. There  is no reported Ashkenazi Jewish ancestry. There is no known consanguinity.  GENETIC COUNSELING ASSESSMENT: Martin [name] is a 79 y.o. male with a personal history of breast cancer and family history of breast and colon cancer and multiple family members with cancer-NOS which somewhat suggestive of a hereditary cancer syndrome and predisposition to cancer. We, therefore, discussed and recommended the following at today's visit.   DISCUSSION: We reviewed the characteristics, features and inheritance patterns of hereditary cancer syndromes. We also discussed genetic testing, including the appropriate family members to test, the process of testing, insurance coverage and turn-around-time for results. We reviewed that men with breast cancer have an increased risk for a hereditary cancer syndrome.  It is thought that about 2/3s of men with breast cancer have a BRCA mutation, although those numbers may be slightly out of date since we are learning more about the increased risk for male breast cancer in some of the other  hereditary cancer genes.  Other genes that are associated with male breast cancer are PABL2 and CHEK2.  Based on his risk we recommend the hereditary breast/ovarian cancer panel.  PLAN: After considering the risks, benefits, and limitations, Martin [name] provided informed consent to pursue genetic testing and the blood sample will be sent to [facility] for analysis of the Breast/ovarian cancer panel. We discussed the implications of a positive, negative and/ or variant of uncertain significance genetic test result. Results should be available within approximately 3 weeks' time, at which point they will be disclosed by telephone to Martin [name], as will any additional recommendations warranted by these results. Martin [name] will receive a summary of his genetic counseling visit and a copy of his results once available. This information will also be available in Epic. We encouraged Martin [name] to remain in contact with cancer genetics annually so that we can continuously update the family history and inform him of any changes in cancer genetics and testing that may be of benefit for his family. Deanna Artis Aube's questions were answered to his satisfaction today. Our contact information was provided should additional questions or concerns arise.  The patient was seen for a total of 60 minutes, greater than 50% of which was spent face-to-face counseling.  This note will also be sent to the referring provider via the electronic medical record. The patient will be supplied with a summary of this genetic counseling discussion as well as educational information on the discussed hereditary cancer syndromes following the conclusion of their visit.   Patient was discussed with Dr. Drue Second.   _______________________________________________________________________ For Office Staff:  Number of people involved in session: 2 Was an Intern/ student involved with case: no

## 2013-02-07 ENCOUNTER — Other Ambulatory Visit: Payer: Self-pay | Admitting: Oncology

## 2013-02-08 ENCOUNTER — Telehealth: Payer: Self-pay | Admitting: Genetic Counselor

## 2013-02-08 NOTE — Telephone Encounter (Signed)
Revealed positive BRCA2 mutation.  He will come back in at 3 PM on 11/25

## 2013-02-13 ENCOUNTER — Ambulatory Visit (HOSPITAL_BASED_OUTPATIENT_CLINIC_OR_DEPARTMENT_OTHER): Payer: Federal, State, Local not specified - PPO | Admitting: Genetic Counselor

## 2013-02-13 DIAGNOSIS — IMO0002 Reserved for concepts with insufficient information to code with codable children: Secondary | ICD-10-CM

## 2013-02-13 DIAGNOSIS — Z1501 Genetic susceptibility to malignant neoplasm of breast: Secondary | ICD-10-CM

## 2013-02-13 DIAGNOSIS — C50929 Malignant neoplasm of unspecified site of unspecified male breast: Secondary | ICD-10-CM

## 2013-02-13 DIAGNOSIS — Z853 Personal history of malignant neoplasm of breast: Secondary | ICD-10-CM

## 2013-02-13 DIAGNOSIS — Z85820 Personal history of malignant melanoma of skin: Secondary | ICD-10-CM

## 2013-02-14 ENCOUNTER — Encounter: Payer: Self-pay | Admitting: Genetic Counselor

## 2013-02-14 DIAGNOSIS — Z1501 Genetic susceptibility to malignant neoplasm of breast: Secondary | ICD-10-CM | POA: Insufficient documentation

## 2013-02-14 NOTE — Progress Notes (Signed)
Martin [name], an 79 y.o. male, was seen for discussion of his genetic test results indicating that he carries a BRCA2 mutation.  He presents to clinic today with his wife, [name], and two sons [name] and [name] to discuss the possibility of a genetic predisposition to cancer, and to further clarify his risks, as well as his family members' risks for cancer.   HISTORY OF PRESENT ILLNESS: In 1992, at the age of 18, Martin [name] was diagnosed with breast cancer.  He will be following up with Dr Ruthann Cancer in January 2014.  He reports having several skin cancers removed, but has been otherwise healthy.  Based on his personal history of breast cancer, Martin [name] was tested for hereditary breast cancer syndromes and was found to carry a BRCA2 mutation.    Past Medical History  Diagnosis Date  . Gout   . Hypertension   . Old anterior wall myocardial infarction   . Thrombocytopenia, immune   . Atrial fibrillation   . Hyperlipidemia   . ICD-Medtronic 11/25/2008    09/28/2007  Medtronic Sprint Quattro Secure S model 1610-96 (serial # [id number])                 Medtronic Virtuoso VR model D15VWC (serial # [id number]) single chamber ICD   . CAD (coronary artery disease)     1985 Anterior MI 1985 with LIMA to LAD [facility]. 1995 PTCA to RCA x2 July 2009 Cath EF 20-25%  Patent LIMA to LAD, occluded LAD, Mild to moderate disease in RCA and Circ July 2014 Lexiscan Myoview  EF 32% anterior MI, no ischemia    . History of breast cancer in male   . Ischemic cardiomyopathy   . Osteoarthritis   . Breast cancer     dx at age 56  . BRCA2 genetic carrier 2014    Past Surgical History  Procedure Laterality Date  . Mastectomy, partial      left breast 1991  . Coronary artery bypass graft      LIMA to LAD in June 1985  . Eye surgery    . Joint replacement      bil  . Hip arthroplasty    . Cardiac catheterization      x4  . Total hip arthroplasty Right 12/29/2012    Procedure: TOTAL HIP ARTHROPLASTY  WITH AUTOGRAFT;  Surgeon: Thera Flake., MD;  Location: [facility] OR;  Service: Orthopedics;  Laterality: Right;    History   Social History  . Marital Status: Married    Spouse Name: N/A    Number of Children: 3  . Years of Education: N/A   Social History Main Topics  . Smoking status: Former Smoker -- 2.00 packs/day for 10 years    Types: Cigarettes    Quit date: 03/22/1964  . Smokeless tobacco: None  . Alcohol Use: Yes  . Drug Use: None  . Sexual Activity: None   Other Topics Concern  . None   Social History Narrative  . None    FAMILY HISTORY:  We obtained a detailed, 4-generation family history.  Significant diagnoses are listed below: Family History  Problem Relation Age of Onset  . Heart attack Father   . Cancer Mother     dx in her 29s, ended up in her bones  . Cancer Brother     ended up in his bones  . Breast cancer Paternal Aunt     died in her late 45s  . Lung  cancer Paternal Uncle   . Cancer Maternal Grandmother     ended up in her bones  . Colon cancer Paternal Grandmother     Patient's maternal ancestors are of [place] descent, and paternal ancestors are of English descent. There is no reported Ashkenazi Jewish ancestry. There is no known consanguinity.  GENETIC COUNSELING ASSESSMENT: MAYJOR AGER is a 79 y.o. male with a breast cancer and a BRCA2 mutation. We, therefore, discussed and recommended the following at today's visit.   DISCUSSION: We reviewed the genetic testing that had been performed, and that a BRCA2 mutation has been found.  BRCA2 mutations increase the risk for male breast cancer up to 7%, and male breast cancer up to 84%.  Other cancer risks include ovarian cancer (up to 41% lifetime risk), prostate cancer (up to 34%), pancreatic cancer (up to 7%) and melanoma (up to 3%).  We reviewed the NCCN guidelines for screening for these cancers.  At this time there is no documented family history of pancreatic cancer.  However, if they are able to  document this in the family we may be able to refer them to [facility] for a pancreatic cancer study.  Martin [name] has 3 boys, who are each at a 50% risk for having inherited this mutation.  Additionally, it is not clear which side of the family this could be coming from, although it is suggestive that it is coming from his mother's side.  Therefore, we do recommend contacting his cousins on both sides of the family to alert them to this finding and allow them to do testing.  Bill and his family were given a copy of the NCCN guidelines for screening for BRCA related cancers, as well as brochure about BRCA mutations.  All questions were answered.   PLAN: Martin [name] will receive a summary of his genetic counseling visit and a copy of his results.  A copy of the results were given to him at this appointment, and a summary will be mailed. This information will also be available in Epic. We encouraged Martin [name] to remain in contact with cancer genetics annually so that we can continuously update the family history and inform him of any changes in cancer genetics and testing that may be of benefit for his family. [name]'s questions were answered to his satisfaction today. Our contact information was provided should additional questions or concerns arise.  The patient was seen for a total of 60 minutes, greater than 50% of which was spent face-to-face counseling.  This note will also be sent to the referring provider via the electronic medical record. The patient will be supplied with a summary of this genetic counseling discussion as well as educational information on the discussed hereditary cancer syndromes following the conclusion of their visit.   Patient was discussed with Dr. Drue Second.   _______________________________________________________________________ For Office Staff:  Number of people involved in session: 4 Was an Intern/ student involved with case: no

## 2013-02-17 ENCOUNTER — Other Ambulatory Visit: Payer: Self-pay | Admitting: Oncology

## 2013-04-17 ENCOUNTER — Telehealth: Payer: Self-pay | Admitting: Oncology

## 2013-04-17 ENCOUNTER — Other Ambulatory Visit (HOSPITAL_BASED_OUTPATIENT_CLINIC_OR_DEPARTMENT_OTHER): Payer: Federal, State, Local not specified - PPO

## 2013-04-17 ENCOUNTER — Ambulatory Visit (HOSPITAL_BASED_OUTPATIENT_CLINIC_OR_DEPARTMENT_OTHER): Payer: Federal, State, Local not specified - PPO | Admitting: Oncology

## 2013-04-17 VITALS — BP 92/58 | HR 80 | Temp 98.1°F | Resp 18 | Ht 68.0 in | Wt 204.0 lb

## 2013-04-17 DIAGNOSIS — N183 Chronic kidney disease, stage 3 unspecified: Secondary | ICD-10-CM

## 2013-04-17 DIAGNOSIS — I4891 Unspecified atrial fibrillation: Secondary | ICD-10-CM

## 2013-04-17 DIAGNOSIS — Z1509 Genetic susceptibility to other malignant neoplasm: Principal | ICD-10-CM

## 2013-04-17 DIAGNOSIS — Z853 Personal history of malignant neoplasm of breast: Secondary | ICD-10-CM

## 2013-04-17 DIAGNOSIS — Z1501 Genetic susceptibility to malignant neoplasm of breast: Secondary | ICD-10-CM

## 2013-04-17 DIAGNOSIS — D696 Thrombocytopenia, unspecified: Secondary | ICD-10-CM

## 2013-04-17 DIAGNOSIS — D649 Anemia, unspecified: Secondary | ICD-10-CM

## 2013-04-17 DIAGNOSIS — C50322 Malignant neoplasm of lower-inner quadrant of left male breast: Secondary | ICD-10-CM | POA: Insufficient documentation

## 2013-04-17 DIAGNOSIS — I2510 Atherosclerotic heart disease of native coronary artery without angina pectoris: Secondary | ICD-10-CM

## 2013-04-17 LAB — CBC & DIFF AND RETIC
BASO%: 0.3 % (ref 0.0–2.0)
Basophils Absolute: 0 10*3/uL (ref 0.0–0.1)
EOS%: 1.9 % (ref 0.0–7.0)
Eosinophils Absolute: 0.1 10*3/uL (ref 0.0–0.5)
HCT: 34.9 % — ABNORMAL LOW (ref 38.4–49.9)
HGB: 11.5 g/dL — ABNORMAL LOW (ref 13.0–17.1)
Immature Retic Fract: 2.6 % — ABNORMAL LOW (ref 3.00–10.60)
LYMPH%: 14.8 % (ref 14.0–49.0)
MCH: 33 pg (ref 27.2–33.4)
MCHC: 33 g/dL (ref 32.0–36.0)
MCV: 100 fL — AB (ref 79.3–98.0)
MONO#: 0.6 10*3/uL (ref 0.1–0.9)
MONO%: 10 % (ref 0.0–14.0)
NEUT#: 4.3 10*3/uL (ref 1.5–6.5)
NEUT%: 73 % (ref 39.0–75.0)
Platelets: 82 10*3/uL — ABNORMAL LOW (ref 140–400)
RBC: 3.49 10*6/uL — AB (ref 4.20–5.82)
RDW: 13.5 % (ref 11.0–14.6)
RETIC %: 0.83 % (ref 0.80–1.80)
Retic Ct Abs: 28.97 10*3/uL — ABNORMAL LOW (ref 34.80–93.90)
WBC: 5.9 10*3/uL (ref 4.0–10.3)
lymph#: 0.9 10*3/uL (ref 0.9–3.3)

## 2013-04-17 NOTE — Progress Notes (Signed)
[place]  [phone]) [phone] [phone]) [phone]     ID: Martin [name] OB: [birth year]  Martin#: [id number]  TKP#:[id number]  PCP: Mathews Argyle, MD GYN:   SU:  OTHER MD:  CHIEF COMPLAINT:  HISTORY OF PRESENT ILLNESS: Martin [name] is a 80 y.o.[employer], [place] male we are asked to see for evaluation of thrombocytopenia. In review, he was initially seen in consultation by Dr. Murriel Hopper at the [facility] on 03/31/2011. The patient carries a prior history of left breast cancer diagnosed in 1991 and treated by Dr. Everardo All. The tumor was lymph node negative. He was treated with a mastectomy and then had chemotherapy followed by tamoxifen therapy. He does not remember which chemotherapy drugs he received. He was first told he was thrombocytopenic subsequent to completion of his chemotherapy. Platelet counts had remained stable for 20 years , only to s to fluctuate when he has acute illness.  As far as 2009 his lowest platelet count recorded that year was 92,000. Counts provided by his primary care physician go back as far as 12/29/2009 when platelet count was 88,000. Subsequently the counts have been in the 74-111,000 range except for 1 outlier from 01/28/11 which was recorded as 30,000. Repeat on 11/27 was 74,000. Platelet count in our office on 03/31/11 was 109,000.   He has had no problems with multiple surgeries in the past including bilateral knee replacements and a left hip replacement. He did not bruise easily. He had coronary bypass surgery in 1985 which antedated his breast cancer diagnosis and chemotherapy treatments. He denies any history of hepatitis, yellow jaundice, malaria, or mononucleosis. He is on a number of medications that could affect platelet function including chronic colchicine and allopurinol. He admits to 2 large drinks daily with either bourbon or gin. He has a chronically elevated MCV up to 103. There is no family history of any  bleeding disorder. He was on anticoagulation with Pradaxa for chronic atrial fibrillation and is status post a implantable defibrillator. Pradaxa was later changed to Eliquis 5 mg bid.   Dr Beryle Beams concluded that the patient's chronic thrombocytopenia was related to previous chemotherapy treatments for male breast cancer, with a mild macrocytic anemia due to fairly heavy alcohol user. Normal white count and differential was noted. He may have a mild myelodysplastic syndrome related to his previous chemotherapy treatments.Long-term use of both allopurinol and colchicine were addressed (these 2 meds were not discontinued as an outpatient). Bone marrow suppressive effects of alcohol were reviewed. No immune thrombocytopenia was suspected. Observation alone was recommended.He was to be seen as needed.   As outpatient he was recently on conservative treatments with NSAID's and corticosteriod injections for his osteoarthritis. Pain was severe, for which surgery was recommended.   After obtaining cardiology clearance, the patient was admitted on 12/29/2012 for a total right hip replacement with autograft. Status was complicated with postoperative hypotension, requiring pulmonary and critical care medicine involvement. He received 2 units of packed RBCs and 1 unit of platelets, IV fluid and pressors with good response. Of note, the patient has experienced CHF after fluid resuscitation requiring increased Lasix IV which may have affected his platelet count as well.   His subsequent history is as detailed below  INTERVAL HISTORY: Martin [name] returns today for followup of his thrombocytopenia and breast cancer history. After the consult in the hospital I suggested he cut down on his drinking and he has done that, no more than one drink per day currently. His  platelet count is back to baseline. He has also been tested for the BRCA gene, and is positive for a mutation in BRCA2. His family is being tested and So far  his second son has been found to be positive   REVIEW OF SYSTEMS: His hearing is poor. He gets short of breath when walking up stairs, but denies chest pain or pressure. He keeps his irregular heartbeat. Occasionally has diarrhea. He is gout. No these are causing him any unusual symptoms at present. He is tolerating the anticoagulant without any bleeding symptoms including no epistaxis, no bright red blood per rectum, melena, or easy bruising. A detailed review of systems today was otherwise stable.   PAST MEDICAL HISTORY: Past Medical History  Diagnosis Date  . Gout   . Hypertension   . Old anterior wall myocardial infarction   . Thrombocytopenia, immune   . Atrial fibrillation   . Hyperlipidemia   . ICD-Medtronic 11/25/2008    09/28/2007  Medtronic Sprint Quattro Secure S model 6935-65 (serial # [id number])                 Medtronic Virtuoso VR model D15VWC (serial # [id number]) single chamber ICD   . CAD (coronary artery disease)     1985 Anterior MI 1985 with LIMA to LAD [place] PTCA to RCA x2 July 2009 Cath EF 20-25%  Patent LIMA to LAD, occluded LAD, Mild to moderate disease in RCA and Circ July 2014 Lexiscan Myoview  EF 32% anterior MI, no ischemia    . History of breast cancer in male   . Ischemic cardiomyopathy   . Osteoarthritis   . Breast cancer     dx at age 53  . BRCA2 genetic carrier 2014    PAST SURGICAL HISTORY: Past Surgical History  Procedure Laterality Date  . Mastectomy, partial      left breast 1991  . Coronary artery bypass graft      LIMA to LAD in June 1985  . Eye surgery    . Joint replacement      bil  . Hip arthroplasty    . Cardiac catheterization      x4  . Total hip arthroplasty Right 12/29/2012    Procedure: TOTAL HIP ARTHROPLASTY WITH AUTOGRAFT;  Surgeon: Yvette Rack., MD;  Location: [facility];  Service: Orthopedics;  Laterality: Right;    FAMILY HISTORY Family History  Problem Relation Age of Onset  . Heart attack Father   .  Cancer Mother     dx in her 58s, ended up in her bones  . Cancer Brother     ended up in his bones  . Breast cancer Paternal Aunt     died in her late 16s  . Lung cancer Paternal Uncle   . Cancer Maternal Grandmother     ended up in her bones  . Colon cancer Paternal Grandmother   he had one sibling, a brother who died at age 77 of metastatic cancer to bone, unknown primary. Paternal grandmother died with colon cancer. Mother died with lung disease. One uncle died with bone cancer. Maternal grandmother died with breast cancer. No family history of bleeding disorders.  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 He discontinued Tobacco use in 1966. He drinks 2 large bourbon origin drinks daily . He is married. He has 3 healthy sons, 2 of them in town, several grand and great grand children. The patient's oldest son was tested and is  negative for the BRCA gene. Son #2, Liliane Channel, is positive, and has 2 children (a son, negative, and a daughter, test pending). The third son is currently in [place] and will be tested when he returns to the [place] He has 2 daughters.     ADVANCED DIRECTIVES: in place   HEALTH MAINTENANCE: History  Substance Use Topics  . Smoking status: Former Smoker -- 2.00 packs/day for 10 years    Types: Cigarettes    Quit date: 03/22/1964  . Smokeless tobacco: Not on file  . Alcohol Use: Yes     Colonoscopy:  PSA:  Bone density:  Lipid panel:  No Known Allergies  Current Outpatient Prescriptions  Medication Sig Dispense Refill  . apixaban (ELIQUIS) 2.5 MG TABS tablet Take 1 tablet (2.5 mg total) by mouth 2 (two) times daily.  60 tablet  0  . colchicine 0.6 MG tablet Take 0.6 mg by mouth daily.        . ferrous sulfate 325 (65 FE) MG tablet Take 325 mg by mouth daily with breakfast.      . furosemide (LASIX) 40 MG tablet Take 40 mg by mouth 2 (two) times daily.        Marland Kitchen HYDROcodone-acetaminophen (NORCO/VICODIN) 5-325 MG per tablet Take 1-2 tablets by  mouth every 4 (four) hours as needed.  100 tablet  0  . metoprolol (LOPRESSOR) 50 MG tablet Take 50 mg by mouth 2 (two) times daily.        . multivitamin (THERAGRAN) per tablet Take 1 tablet by mouth daily.        . potassium chloride SA (K-DUR,KLOR-CON) 20 MEQ tablet Take 20 mEq by mouth daily.        . rosuvastatin (CRESTOR) 20 MG tablet Take 20 mg by mouth daily.        Marland Kitchen terazosin (HYTRIN) 5 MG capsule Take 1 capsule (5 mg total) by mouth at bedtime.      . valsartan (DIOVAN) 80 MG tablet Take 1 tablet (80 mg total) by mouth daily.      . vitamin C (ASCORBIC ACID) 500 MG tablet Take 500 mg by mouth daily.         No current facility-administered medications for this visit.    OBJECTIVE: Elderly white male in no acute distress Filed Vitals:   04/17/13 1510  BP: 92/58  Pulse: 80  Temp: 98.1 F (36.7 C)  Resp: 18     Body mass index is 31.03 kg/(m^2).    ECOG FS:1 - Symptomatic but completely ambulatory  Ocular: Sclerae unicteric, EOMs intact Ear-nose-throat: Oropharynx clear, no thrush or other lesions Lymphatic: No cervical or supraclavicular adenopathy Lungs no rales or rhonchi, good excursion bilaterally Heart regular rate and rhythm, no murmur appreciated Abd soft, nontender, positive bowel sounds MSK no focal spinal tenderness, no joint edema Neuro: non-focal, well-oriented, appropriate affect Breasts: I do not palpate a mass in the right breast. The left breast is status post mastectomy. There is no evidence of local recurrence. The left axilla is benign.   LAB RESULTS:  CMP     Component Value Date/Time   NA 137 01/05/2013 0912   K 3.3* 01/05/2013 0912   CL 98 01/05/2013 0912   CO2 27 01/05/2013 0912   GLUCOSE 152* 01/05/2013 0912   BUN 30* 01/05/2013 0912   CREATININE 1.41* 01/05/2013 0912   CALCIUM 8.6 01/05/2013 0912   PROT 8.0 12/21/2012 1124   ALBUMIN 4.1 12/21/2012 1124   AST 27 12/21/2012 1124  ALT 23 12/21/2012 1124   ALKPHOS 62 12/21/2012 1124    BILITOT 1.0 12/21/2012 1124   GFRNONAA 45* 01/05/2013 0912   GFRAA 53* 01/05/2013 0912    I No results found for this basename: SPEP, UPEP,  kappa and lambda light chains    Lab Results  Component Value Date   WBC 5.9 04/17/2013   NEUTROABS 4.3 04/17/2013   HGB 11.5* 04/17/2013   HCT 34.9* 04/17/2013   MCV 100.0* 04/17/2013   PLT 82* 04/17/2013      Chemistry      Component Value Date/Time   NA 137 01/05/2013 0912   K 3.3* 01/05/2013 0912   CL 98 01/05/2013 0912   CO2 27 01/05/2013 0912   BUN 30* 01/05/2013 0912   CREATININE 1.41* 01/05/2013 0912      Component Value Date/Time   CALCIUM 8.6 01/05/2013 0912   ALKPHOS 62 12/21/2012 1124   AST 27 12/21/2012 1124   ALT 23 12/21/2012 1124   BILITOT 1.0 12/21/2012 1124       No results found for this basename: LABCA2    No components found with this basename: LABCA125    No results found for this basename: INR,  in the last 168 hours  Urinalysis    Component Value Date/Time   COLORURINE YELLOW 12/21/2012 [place] 12/21/2012 1124   LABSPEC 1.010 12/21/2012 1124   PHURINE 7.0 12/21/2012 1124   GLUCOSEU NEGATIVE 12/21/2012 1124   HGBUR TRACE* 12/21/2012 1124   [place] 12/21/2012 1124   KETONESUR NEGATIVE 12/21/2012 1124   PROTEINUR NEGATIVE 12/21/2012 1124   UROBILINOGEN 0.2 12/21/2012 1124   NITRITE NEGATIVE 12/21/2012 1124   LEUKOCYTESUR NEGATIVE 12/21/2012 1124    STUDIES: No results found.  ASSESSMENT: 80 y.o. [place] man  (1) BRCA-2 positive [phone])  (2) status post right radical mastectomy for breast cancer 1991  (3) chronic moderate thrombocytopenia, stable  (4) atrial fibrillation with a rapid ventricular response, on apixaban/ Eliquis  (5) ischemic cardiomyopathy  (6) stage III chronic kidney injury  PLAN: Martin [name] is doing very well currently as far as his anticoagulation is concerned. His platelet count is back to baseline and so long as it remains over  50,000, no intervention is required.Marland Kitchen He understands excessivealcohol intake can affect the bone marrow, and he has decreased his EtOH intake to no more than one drink a day. He will need yearly mammography and this is being set up. We will be glad to provide genetic counselor to his children as needed. Otherwise he will see me again in October. We will likely release him from followup at that time  Chauncey Cruel, MD   04/18/2013 8:48 AM

## 2013-05-11 ENCOUNTER — Ambulatory Visit
Admission: RE | Admit: 2013-05-11 | Discharge: 2013-05-11 | Disposition: A | Discharge status: Home / Self Care | Payer: Federal, State, Local not specified - PPO | Source: Ambulatory Visit | Attending: Oncology | Admitting: Oncology

## 2013-05-11 DIAGNOSIS — Z853 Personal history of malignant neoplasm of breast: Secondary | ICD-10-CM

## 2013-05-11 DIAGNOSIS — I2510 Atherosclerotic heart disease of native coronary artery without angina pectoris: Secondary | ICD-10-CM

## 2013-05-11 DIAGNOSIS — Z1509 Genetic susceptibility to other malignant neoplasm: Principal | ICD-10-CM

## 2013-05-11 DIAGNOSIS — Z1501 Genetic susceptibility to malignant neoplasm of breast: Secondary | ICD-10-CM

## 2013-12-25 ENCOUNTER — Ambulatory Visit (INDEPENDENT_AMBULATORY_CARE_PROVIDER_SITE_OTHER): Payer: Federal, State, Local not specified - PPO | Admitting: Internal Medicine

## 2013-12-25 ENCOUNTER — Encounter: Payer: Self-pay | Admitting: Internal Medicine

## 2013-12-25 VITALS — BP 126/82 | HR 77 | Ht 67.0 in | Wt 201.8 lb

## 2013-12-25 DIAGNOSIS — I482 Chronic atrial fibrillation, unspecified: Secondary | ICD-10-CM

## 2013-12-25 DIAGNOSIS — Z95810 Presence of automatic (implantable) cardiac defibrillator: Secondary | ICD-10-CM

## 2013-12-25 DIAGNOSIS — I5022 Chronic systolic (congestive) heart failure: Secondary | ICD-10-CM

## 2013-12-25 DIAGNOSIS — I2510 Atherosclerotic heart disease of native coronary artery without angina pectoris: Secondary | ICD-10-CM

## 2013-12-25 LAB — MDC_IDC_ENUM_SESS_TYPE_INCLINIC
Battery Voltage: 3.09 V
HIGH POWER IMPEDANCE MEASURED VALUE: 53 Ohm
Lead Channel Impedance Value: 340 Ohm
Lead Channel Pacing Threshold Amplitude: 1.5 V
Lead Channel Pacing Threshold Pulse Width: 0.4 ms
Lead Channel Sensing Intrinsic Amplitude: 18.6 mV
Lead Channel Setting Pacing Pulse Width: 0.4 ms
Lead Channel Setting Sensing Sensitivity: 0.3 mV
MDC IDC SESS DTM: 20151006121337
MDC IDC SET LEADCHNL RV PACING AMPLITUDE: 2.5 V
MDC IDC SET ZONE DETECTION INTERVAL: 350 ms
MDC IDC STAT BRADY RV PERCENT PACED: 0.1 %
Zone Setting Detection Interval: 300 ms
Zone Setting Detection Interval: 400 ms

## 2013-12-25 NOTE — Assessment & Plan Note (Signed)
His Medtronic ICD is working normally. We'll plan to recheck in several months. 

## 2013-12-25 NOTE — Assessment & Plan Note (Signed)
His symptoms are class II. No change in medical therapy. He will continue a low sodium diet.

## 2013-12-25 NOTE — Assessment & Plan Note (Signed)
His ventricular rate is well controlled. No change in medical therapy. 

## 2013-12-25 NOTE — Patient Instructions (Signed)
Your physician recommends that you continue on your current medications as directed. Please refer to the Current Medication list given to you today.  Your physician wants you to follow-up in: 1 year with Dr. Taylor.  You will receive a reminder letter in the mail two months in advance. If you don't receive a letter, please call our office to schedule the follow-up appointment.  

## 2013-12-25 NOTE — Progress Notes (Signed)
HPI [name] returns today for followup. He is a very pleasant 80 year old man with an ischemic cm, and chronic class II systolic heart failure, chronic atrial fibrillation, chronic left ventricular dysfunction, ejection fraction 25%, status post ICD implantation. The patient is s/p hip replacement surgery. He denies chest pain, shortness of breath, peripheral edema, syncope, or recent ICD shock.  No Known Allergies   Current Outpatient Prescriptions  Medication Sig Dispense Refill  . apixaban (ELIQUIS) 2.5 MG TABS tablet Take 1 tablet (2.5 mg total) by mouth 2 (two) times daily.  60 tablet  0  . atorvastatin (LIPITOR) 40 MG tablet Take 1 tablet by mouth daily.      . colchicine 0.6 MG tablet Take 0.6 mg by mouth daily.        . ferrous sulfate 325 (65 FE) MG tablet Take 325 mg by mouth daily with breakfast.      . furosemide (LASIX) 40 MG tablet Take 40 mg by mouth 2 (two) times daily.        Marland Kitchen HYDROcodone-acetaminophen (NORCO/VICODIN) 5-325 MG per tablet Take 1-2 tablets by mouth every 4 (four) hours as needed.  100 tablet  0  . metoprolol (LOPRESSOR) 50 MG tablet Take 50 mg by mouth 2 (two) times daily.        . multivitamin (THERAGRAN) per tablet Take 1 tablet by mouth daily.        . potassium chloride SA (K-DUR,KLOR-CON) 20 MEQ tablet Take 20 mEq by mouth daily.        Marland Kitchen terazosin (HYTRIN) 5 MG capsule Take 1 capsule (5 mg total) by mouth at bedtime.      . valsartan (DIOVAN) 80 MG tablet Take 40 mg by mouth daily.      . vitamin C (ASCORBIC ACID) 500 MG tablet Take 500 mg by mouth daily.         No current facility-administered medications for this visit.     Past Medical History  Diagnosis Date  . Gout   . Hypertension   . Old anterior wall myocardial infarction   . Thrombocytopenia, immune   . Atrial fibrillation   . Hyperlipidemia   . ICD-Medtronic 11/25/2008    09/28/2007  Medtronic Sprint Quattro Secure S model 6935-65 (serial # [id number])                 Medtronic  Virtuoso VR model D15VWC (serial # [id number]) single chamber ICD   . CAD (coronary artery disease)     1985 Anterior MI 1985 with LIMA to LAD [place] PTCA to RCA x2 July 2009 Cath EF 20-25%  Patent LIMA to LAD, occluded LAD, Mild to moderate disease in RCA and Circ July 2014 Lexiscan Myoview  EF 32% anterior MI, no ischemia    . History of breast cancer in male   . Ischemic cardiomyopathy   . Osteoarthritis   . Breast cancer     dx at age 40  . BRCA2 genetic carrier 2014    ROS:   All systems reviewed and negative except as noted in the HPI.   Past Surgical History  Procedure Laterality Date  . Mastectomy, partial      left breast 1991  . Coronary artery bypass graft      LIMA to LAD in June 1985  . Eye surgery    . Joint replacement      bil  . Hip arthroplasty    . Cardiac catheterization  x4  . Total hip arthroplasty Right 12/29/2012    Procedure: TOTAL HIP ARTHROPLASTY WITH AUTOGRAFT;  Surgeon: Martin [name]., MD;  Location: [facility];  Service: Orthopedics;  Laterality: Right;     Family History  Problem Relation Age of Onset  . Heart attack Father   . Cancer Mother     dx in her 2s, ended up in her bones  . Cancer Brother     ended up in his bones  . Breast cancer Paternal Aunt     died in her late 32s  . Lung cancer Paternal Uncle   . Cancer Maternal Grandmother     ended up in her bones  . Colon cancer Paternal Grandmother      History   Social History  . Marital Status: Married    Spouse Name: N/A    Number of Children: 3  . Years of Education: N/A   Occupational History  . Not on file.   Social History Main Topics  . Smoking status: Former Smoker -- 2.00 packs/day for 10 years    Types: Cigarettes    Quit date: 03/22/1964  . Smokeless tobacco: Not on file  . Alcohol Use: Yes  . Drug Use: Not on file  . Sexual Activity: Not on file   Other Topics Concern  . Not on file   Social History Narrative  . No narrative on file       BP 126/82  Pulse 77  Ht _0  (1.702 m)  Wt 201 lb 12.8 oz (91.536 kg)  BMI 31.60 kg/m2  Physical Exam:  Well appearing elderly man, NAD HEENT: Unremarkable Neck:  6 cm JVD, no thyromegally Back:  No CVA tenderness Lungs:  Clear with no wheezes, rales, or rhonchi. Well-healed ICD incision. HEART:  IRegular rate rhythm, no murmurs, no rubs, no clicks Abd:  soft, positive bowel sounds, no organomegally, no rebound, no guarding Ext:  2 plus pulses, no edema, no cyanosis, no clubbing Skin:  No rashes no nodules Neuro:  CN II through XII intact, motor grossly intact  DEVICE  Normal device function.  See PaceArt for details.   Assess/Plan:

## 2013-12-26 ENCOUNTER — Encounter: Payer: Self-pay | Admitting: Internal Medicine

## 2014-01-08 ENCOUNTER — Ambulatory Visit (HOSPITAL_BASED_OUTPATIENT_CLINIC_OR_DEPARTMENT_OTHER): Payer: Federal, State, Local not specified - PPO | Admitting: Oncology

## 2014-01-08 ENCOUNTER — Telehealth: Payer: Self-pay | Admitting: Oncology

## 2014-01-08 ENCOUNTER — Other Ambulatory Visit (HOSPITAL_BASED_OUTPATIENT_CLINIC_OR_DEPARTMENT_OTHER): Payer: Federal, State, Local not specified - PPO

## 2014-01-08 VITALS — HR 70 | Temp 98.3°F | Resp 18 | Ht 67.0 in | Wt 196.6 lb

## 2014-01-08 DIAGNOSIS — Z853 Personal history of malignant neoplasm of breast: Secondary | ICD-10-CM

## 2014-01-08 DIAGNOSIS — I4891 Unspecified atrial fibrillation: Secondary | ICD-10-CM

## 2014-01-08 DIAGNOSIS — Z1501 Genetic susceptibility to malignant neoplasm of breast: Secondary | ICD-10-CM

## 2014-01-08 DIAGNOSIS — Z1509 Genetic susceptibility to other malignant neoplasm: Principal | ICD-10-CM

## 2014-01-08 DIAGNOSIS — D696 Thrombocytopenia, unspecified: Secondary | ICD-10-CM

## 2014-01-08 DIAGNOSIS — D539 Nutritional anemia, unspecified: Secondary | ICD-10-CM

## 2014-01-08 DIAGNOSIS — C50322 Malignant neoplasm of lower-inner quadrant of left male breast: Secondary | ICD-10-CM

## 2014-01-08 DIAGNOSIS — I2510 Atherosclerotic heart disease of native coronary artery without angina pectoris: Secondary | ICD-10-CM

## 2014-01-08 LAB — CBC WITH DIFFERENTIAL/PLATELET
BASO%: 0.8 % (ref 0.0–2.0)
Basophils Absolute: 0.1 10*3/uL (ref 0.0–0.1)
EOS%: 2.6 % (ref 0.0–7.0)
Eosinophils Absolute: 0.2 10*3/uL (ref 0.0–0.5)
HEMATOCRIT: 36.9 % — AB (ref 38.4–49.9)
HGB: 11.9 g/dL — ABNORMAL LOW (ref 13.0–17.1)
LYMPH%: 14.9 % (ref 14.0–49.0)
MCH: 33.2 pg (ref 27.2–33.4)
MCHC: 32.3 g/dL (ref 32.0–36.0)
MCV: 102.8 fL — ABNORMAL HIGH (ref 79.3–98.0)
MONO#: 0.6 10*3/uL (ref 0.1–0.9)
MONO%: 10.3 % (ref 0.0–14.0)
NEUT#: 4.5 10*3/uL (ref 1.5–6.5)
NEUT%: 71.4 % (ref 39.0–75.0)
Platelets: 82 10*3/uL — ABNORMAL LOW (ref 140–400)
RBC: 3.59 10*6/uL — AB (ref 4.20–5.82)
RDW: 13.2 % (ref 11.0–14.6)
WBC: 6.3 10*3/uL (ref 4.0–10.3)
lymph#: 0.9 10*3/uL (ref 0.9–3.3)

## 2014-01-08 LAB — COMPREHENSIVE METABOLIC PANEL (CC13)
ALT: 25 U/L (ref 0–55)
AST: 27 U/L (ref 5–34)
Albumin: 4 g/dL (ref 3.5–5.0)
Alkaline Phosphatase: 106 U/L (ref 40–150)
Anion Gap: 10 mEq/L (ref 3–11)
BILIRUBIN TOTAL: 1.21 mg/dL — AB (ref 0.20–1.20)
BUN: 27.8 mg/dL — AB (ref 7.0–26.0)
CO2: 26 mEq/L (ref 22–29)
Calcium: 9.6 mg/dL (ref 8.4–10.4)
Chloride: 108 mEq/L (ref 98–109)
Creatinine: 1.7 mg/dL — ABNORMAL HIGH (ref 0.7–1.3)
GLUCOSE: 110 mg/dL (ref 70–140)
Potassium: 4 mEq/L (ref 3.5–5.1)
Sodium: 144 mEq/L (ref 136–145)
Total Protein: 7.7 g/dL (ref 6.4–8.3)

## 2014-01-08 NOTE — Addendum Note (Signed)
Addended by: Renford Dills on: 01/08/2014 04:26 PM   Modules accepted: Orders

## 2014-01-08 NOTE — Telephone Encounter (Signed)
sch appt per pof -gave pt copy of sch

## 2014-01-08 NOTE — Progress Notes (Signed)
[place]  [phone] [phone]) [phone]     ID: Martin [name] OB: 1931/01/05  Martin#: [id number]  FUX#:[id number]  PCP: Mathews Argyle, MD GYN:   SU:  OTHER MD: Tollie Eth MD  CHIEF COMPLAINT: History of breast cancer,  BRCA2 positivity, thrombocytopenia CURRENT TREATMENT: Observation  HISTORY OF PRESENT ILLNESS: From the original intake note:  Martin [name] is a 80 y.o.[employer], [place] male we are asked to see for evaluation of thrombocytopenia. In review, he was initially seen in consultation by Dr. Murriel Hopper at the [facility] on 03/31/2011. The patient carries a prior history of left breast cancer diagnosed in 1991 and treated by Dr. Everardo All. The tumor was lymph node negative. He was treated with a mastectomy and then had chemotherapy followed by tamoxifen therapy. He does not remember which chemotherapy drugs he received. He was first told he was thrombocytopenic subsequent to completion of his chemotherapy. Platelet counts had remained stable for 20 years , only to s to fluctuate when he has acute illness.  As far as 2009 his lowest platelet count recorded that year was 92,000. Counts provided by his primary care physician go back as far as 12/29/2009 when platelet count was 88,000. Subsequently the counts have been in the 74-111,000 range except for 1 outlier from 01/28/11 which was recorded as 30,000. Repeat on 11/27 was 74,000. Platelet count in our office on 03/31/11 was 109,000.   He has had no problems with multiple surgeries in the past including bilateral knee replacements and a left hip replacement. He did not bruise easily. He had coronary bypass surgery in 1985 which antedated his breast cancer diagnosis and chemotherapy treatments. He denies any history of hepatitis, yellow jaundice, malaria, or mononucleosis. He is on a number of medications that could affect platelet function including chronic colchicine and allopurinol. He  admits to 2 large drinks daily with either bourbon or gin. He has a chronically elevated MCV up to 103. There is no family history of any bleeding disorder. He was on anticoagulation with Pradaxa for chronic atrial fibrillation and is status post a implantable defibrillator. Pradaxa was later changed to Eliquis 5 mg bid.   Dr Beryle Beams concluded that the patient's chronic thrombocytopenia was related to previous chemotherapy treatments for male breast cancer, with a mild macrocytic anemia due to fairly heavy alcohol user. Normal white count and differential was noted. He may have a mild myelodysplastic syndrome related to his previous chemotherapy treatments.Long-term use of both allopurinol and colchicine were addressed (these 2 meds were not discontinued as an outpatient). Bone marrow suppressive effects of alcohol were reviewed. No immune thrombocytopenia was suspected. Observation alone was recommended.He was to be seen as needed.   As outpatient he was recently on conservative treatments with NSAID's and corticosteriod injections for his osteoarthritis. Pain was severe, for which surgery was recommended.   After obtaining cardiology clearance, the patient was admitted on 12/29/2012 for a total right hip replacement with autograft. Status was complicated with postoperative hypotension, requiring pulmonary and critical care medicine involvement. He received 2 units of packed RBCs and 1 unit of platelets, IV fluid and pressors with good response. Of note, the patient has experienced CHF after fluid resuscitation requiring increased Lasix IV which may have affected his platelet count as well.   His subsequent history is as detailed below  INTERVAL HISTORY: Martin [name] returns today for followup of his thrombocytopenia and breast cancer history. He is doing well on both counts. In  particular, despite being on apixaban I. he reports no bleeding problems. He has noted no change in his remaining breast.  Incidentally at this point every member of the family has been tested except one granddaughter who does not like to be stuck, and a grandson who had no insurance.  REVIEW OF SYSTEMS: After our discussion last year he cut down on his drinking and he just has one drink a day. He is very satisfied with that. Of course he continues to have an irregular heartbeat but his cardiac problems appear to be well managed on stable. He doesn't water walking about 3 times a week a disappears Y. There has been no change in bowel or bladder habits, no change in urinary stream, no abdominal distention, cramps, or other GI or urologic symptoms. A detailed review of systems today was otherwise entirely stable  PAST MEDICAL HISTORY: Past Medical History  Diagnosis Date  . Gout   . Hypertension   . Old anterior wall myocardial infarction   . Thrombocytopenia, immune   . Atrial fibrillation   . Hyperlipidemia   . ICD-Medtronic 11/25/2008    09/28/2007  Medtronic Sprint Quattro Secure S model 6935-65 (serial # [id number])                 Medtronic Virtuoso VR model D15VWC (serial # [id number]) single chamber ICD   . CAD (coronary artery disease)     1985 Anterior MI 1985 with LIMA to LAD [place] PTCA to RCA x2 July 2009 Cath EF 20-25%  Patent LIMA to LAD, occluded LAD, Mild to moderate disease in RCA and Circ July 2014 Lexiscan Myoview  EF 32% anterior MI, no ischemia    . History of breast cancer in male   . Ischemic cardiomyopathy   . Osteoarthritis   . Breast cancer     dx at age 76  . BRCA2 genetic carrier 2014    PAST SURGICAL HISTORY: Past Surgical History  Procedure Laterality Date  . Mastectomy, partial      left breast 1991  . Coronary artery bypass graft      LIMA to LAD in June 1985  . Eye surgery    . Joint replacement      bil  . Hip arthroplasty    . Cardiac catheterization      x4  . Total hip arthroplasty Right 12/29/2012    Procedure: TOTAL HIP ARTHROPLASTY WITH AUTOGRAFT;   Surgeon: Yvette Rack., MD;  Location: [facility];  Service: Orthopedics;  Laterality: Right;    FAMILY HISTORY Family History  Problem Relation Age of Onset  . Heart attack Father   . Cancer Mother     dx in her 77s, ended up in her bones  . Cancer Brother     ended up in his bones  . Breast cancer Paternal Aunt     died in her late 67s  . Lung cancer Paternal Uncle   . Cancer Maternal Grandmother     ended up in her bones  . Colon cancer Paternal Grandmother   he had one sibling, a brother who died at age 31 of metastatic cancer to bone, unknown primary. Paternal grandmother died with colon cancer. Mother died with lung disease. One uncle died with bone cancer. Maternal grandmother died with breast cancer. No family history of bleeding disorders.  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 He discontinued Tobacco use in 1966. He drinks 2 large bourbon origin drinks daily .  He is married. He has 3 healthy sons, 2 of them in town, several grand and great grand children. The patient's oldest son was tested and is negative for the BRCA gene. Son #2, Liliane Channel, is positive, and has 2 children (a son, negative, and a daughter, test pending). The third son has also been tested, but I do not have those results. He has 2 daughters.     ADVANCED DIRECTIVES: in place   HEALTH MAINTENANCE: History  Substance Use Topics  . Smoking status: Former Smoker -- 2.00 packs/day for 10 years    Types: Cigarettes    Quit date: 03/22/1964  . Smokeless tobacco: Not on file  . Alcohol Use: Yes     Colonoscopy:  PSA:  Bone density:  Lipid panel:  No Known Allergies  Current Outpatient Prescriptions  Medication Sig Dispense Refill  . apixaban (ELIQUIS) 2.5 MG TABS tablet Take 1 tablet (2.5 mg total) by mouth 2 (two) times daily.  60 tablet  0  . atorvastatin (LIPITOR) 40 MG tablet Take 1 tablet by mouth daily.      . colchicine 0.6 MG tablet Take 0.6 mg by mouth daily.        .  ferrous sulfate 325 (65 FE) MG tablet Take 325 mg by mouth daily with breakfast.      . furosemide (LASIX) 40 MG tablet Take 40 mg by mouth 2 (two) times daily.        Marland Kitchen HYDROcodone-acetaminophen (NORCO/VICODIN) 5-325 MG per tablet Take 1-2 tablets by mouth every 4 (four) hours as needed.  100 tablet  0  . metoprolol (LOPRESSOR) 50 MG tablet Take 50 mg by mouth 2 (two) times daily.        . multivitamin (THERAGRAN) per tablet Take 1 tablet by mouth daily.        . potassium chloride SA (K-DUR,KLOR-CON) 20 MEQ tablet Take 20 mEq by mouth daily.        Marland Kitchen terazosin (HYTRIN) 5 MG capsule Take 1 capsule (5 mg total) by mouth at bedtime.      . valsartan (DIOVAN) 80 MG tablet Take 40 mg by mouth daily.      . vitamin C (ASCORBIC ACID) 500 MG tablet Take 500 mg by mouth daily.         No current facility-administered medications for this visit.    OBJECTIVE: Elderly white male who appears stated age 56 Vitals:   01/08/14 1524  Pulse: 70  Temp: 98.3 F (36.8 C)  Resp: 18     Body mass index is 30.78 kg/(m^2).    ECOG FS:0 - Asymptomatic  Ocular: Sclerae unicteric, pupils round and equal Ear-nose-throat: Oropharynx clear, teeth in good repair Lymphatic: No cervical or supraclavicular adenopathy Lungs no rales or rhonchi Heart regular rate and rhythm Abd soft, nontender, positive bowel sounds, no masses palpated MSK no focal spinal tenderness to vigorous percussion, no joint edema Neuro: non-focal, well-oriented, positive affect Breasts: I do not palpate a mass in the right breast. The left breast is status post mastectomy. There is no evidence of local recurrence. The left axilla is benign.   LAB RESULTS: Results for Martin, [name] (MRN [id number]) as of 01/08/2014 15:19  Ref. Range 01/03/2013 05:45 01/04/2013 10:35 01/05/2013 09:12 04/17/2013 14:08 01/08/2014 14:58  Platelets Latest Range: 150-400 K/uL 74 (L) 90 (L) 96 (L) 82 (L) 82 (L)   CMP     Component Value Date/Time   NA 144  01/08/2014 1458   NA 137 01/05/2013  0912   K 4.0 01/08/2014 1458   K 3.3* 01/05/2013 0912   CL 98 01/05/2013 0912   CO2 26 01/08/2014 1458   CO2 27 01/05/2013 0912   GLUCOSE 110 01/08/2014 1458   GLUCOSE 152* 01/05/2013 0912   BUN 27.8* 01/08/2014 1458   BUN 30* 01/05/2013 0912   CREATININE 1.7* 01/08/2014 1458   CREATININE 1.41* 01/05/2013 0912   CALCIUM 9.6 01/08/2014 1458   CALCIUM 8.6 01/05/2013 0912   PROT 7.7 01/08/2014 1458   PROT 8.0 12/21/2012 1124   ALBUMIN 4.0 01/08/2014 1458   ALBUMIN 4.1 12/21/2012 1124   AST 27 01/08/2014 1458   AST 27 12/21/2012 1124   ALT 25 01/08/2014 1458   ALT 23 12/21/2012 1124   ALKPHOS 106 01/08/2014 1458   ALKPHOS 62 12/21/2012 1124   BILITOT 1.21* 01/08/2014 1458   BILITOT 1.0 12/21/2012 1124   GFRNONAA 45* 01/05/2013 0912   GFRAA 53* 01/05/2013 0912    I No results found for this basename: SPEP,  UPEP,   kappa and lambda light chains    Lab Results  Component Value Date   WBC 6.3 01/08/2014   NEUTROABS 4.5 01/08/2014   HGB 11.9* 01/08/2014   HCT 36.9* 01/08/2014   MCV 102.8* 01/08/2014   PLT 82* 01/08/2014      Chemistry      Component Value Date/Time   NA 144 01/08/2014 1458   NA 137 01/05/2013 0912   K 4.0 01/08/2014 1458   K 3.3* 01/05/2013 0912   CL 98 01/05/2013 0912   CO2 26 01/08/2014 1458   CO2 27 01/05/2013 0912   BUN 27.8* 01/08/2014 1458   BUN 30* 01/05/2013 0912   CREATININE 1.7* 01/08/2014 1458   CREATININE 1.41* 01/05/2013 0912      Component Value Date/Time   CALCIUM 9.6 01/08/2014 1458   CALCIUM 8.6 01/05/2013 0912   ALKPHOS 106 01/08/2014 1458   ALKPHOS 62 12/21/2012 1124   AST 27 01/08/2014 1458   AST 27 12/21/2012 1124   ALT 25 01/08/2014 1458   ALT 23 12/21/2012 1124   BILITOT 1.21* 01/08/2014 1458   BILITOT 1.0 12/21/2012 1124       No results found for this basename: LABCA2    No components found with this basename: LABCA125    No results found for this basename: INR,  in the last  168 hours  Urinalysis    Component Value Date/Time   COLORURINE YELLOW 12/21/2012 [place] 12/21/2012 1124   LABSPEC 1.010 12/21/2012 1124   PHURINE 7.0 12/21/2012 [place] 12/21/2012 1124   HGBUR TRACE* 12/21/2012 1124   [place] 12/21/2012 [place] 12/21/2012 [place] 12/21/2012 1124   UROBILINOGEN 0.2 12/21/2012 1124   NITRITE NEGATIVE 12/21/2012 [place] 12/21/2012 1124    STUDIES: CLINICAL DATA: History of left breast cancer status post  mastectomy. Patient complains of right breast tenderness. He is  BRCA2 positive.  EXAM:  DIGITAL DIAGNOSTIC UNILATERAL RIGHT MAMMOGRAM WITH TOMOSYNTHESIS AND  CAD  COMPARISON: Prior mammograms dated 12/11/2012 and 12/30/2004.  ACR Breast Density Category b: There are scattered areas of  fibroglandular density.  FINDINGS:  No suspicious mass, malignant type microcalcifications or distortion  detected.  Mammographic images were processed with CAD.  IMPRESSION:  No evidence of malignancy in the right breast.  RECOMMENDATION:  Unilateral right screening mammogram in 1 year is recommended.  I have discussed the findings  and recommendations with the patient.  Results were also provided in writing at the conclusion of the  visit. If applicable, a reminder letter will be sent to the patient  regarding the next appointment.  BI-RADS CATEGORY 1: Negative.  Electronically Signed  By: Lillia Mountain M.D.  On: 05/11/2013 12:06         ASSESSMENT: 80 y.o. [place] man  (1) BRCA-2 positive [phone])  (2) status post right radical mastectomy for breast cancer 1991  (3) chronic moderate thrombocytopenia, stable  (4) atrial fibrillation with a rapid ventricular response, on apixaban/ Eliquis  (5) ischemic cardiomyopathy  (6) stage III chronic kidney injury  (7) mild macrocytic anemia with normal B-12 and folate studies (OCT  2014)  PLAN: Martin [name] is doing fine as far as his breast cancer is concerned, with no evidence of disease recurrence and no new breast cancer developing. He and his family have been very proactive regarding BRCA testing. I am going to check with our genetics counselor to see if it is possible to test his granddaughter using saliva instead of blood, since she has a phobia of needles.  He has a very minimal anemia, and a mild macrocytosis. I checked B12 and folate last year and they were fine. This is nonprogressive. It requires only observation.  So long as his platelet count remains above 50,000 no intervention is needed. He a ready gets his lab work at Dr. Carlyle Lipa at least twice a year. Accordingly he will see me again in a year from now, with lab work a week before that visit. He will continue to have yearly mammography as before.   He knows that we'll be glad to see him before next October if the need arises for any reason.  Chauncey Cruel, MD   01/08/2014 3:40 PM

## 2014-04-29 ENCOUNTER — Other Ambulatory Visit: Payer: Self-pay | Admitting: Geriatric Medicine

## 2014-04-29 ENCOUNTER — Ambulatory Visit
Admission: RE | Admit: 2014-04-29 | Discharge: 2014-04-29 | Disposition: A | Discharge status: Home / Self Care | Payer: Federal, State, Local not specified - PPO | Source: Ambulatory Visit | Attending: Geriatric Medicine | Admitting: Geriatric Medicine

## 2014-04-29 DIAGNOSIS — R05 Cough: Secondary | ICD-10-CM

## 2014-04-29 DIAGNOSIS — R059 Cough, unspecified: Secondary | ICD-10-CM

## 2014-12-24 ENCOUNTER — Encounter: Payer: Self-pay | Admitting: *Deleted

## 2014-12-26 ENCOUNTER — Encounter: Payer: Self-pay | Admitting: Internal Medicine

## 2014-12-26 ENCOUNTER — Ambulatory Visit (INDEPENDENT_AMBULATORY_CARE_PROVIDER_SITE_OTHER): Payer: Federal, State, Local not specified - PPO | Admitting: Internal Medicine

## 2014-12-26 VITALS — BP 120/76 | HR 97 | Ht 67.0 in | Wt 192.4 lb

## 2014-12-26 DIAGNOSIS — I482 Chronic atrial fibrillation, unspecified: Secondary | ICD-10-CM

## 2014-12-26 DIAGNOSIS — I5022 Chronic systolic (congestive) heart failure: Secondary | ICD-10-CM

## 2014-12-26 DIAGNOSIS — Z95810 Presence of automatic (implantable) cardiac defibrillator: Secondary | ICD-10-CM

## 2014-12-26 LAB — CUP PACEART INCLINIC DEVICE CHECK
Battery Voltage: 3.06 V
HighPow Impedance: 53 Ohm
Lead Channel Impedance Value: 332 Ohm
Lead Channel Pacing Threshold Amplitude: 1 V
Lead Channel Sensing Intrinsic Amplitude: 18 mV
Lead Channel Setting Pacing Amplitude: 2.5 V
Lead Channel Setting Sensing Sensitivity: 0.3 mV
MDC IDC MSMT LEADCHNL RV PACING THRESHOLD PULSEWIDTH: 0.4 ms
MDC IDC SESS DTM: 20161006144806
MDC IDC SET LEADCHNL RV PACING PULSEWIDTH: 0.4 ms
MDC IDC SET ZONE DETECTION INTERVAL: 350 ms
MDC IDC STAT BRADY RV PERCENT PACED: 0.09 %
Zone Setting Detection Interval: 300 ms
Zone Setting Detection Interval: 400 ms

## 2014-12-26 NOTE — Assessment & Plan Note (Signed)
His medtronic device is working normally. Will recheck in several months.  

## 2014-12-26 NOTE — Progress Notes (Signed)
HPI [name] returns today for followup. He is a very pleasant 81 year old man with an ischemic cm, and chronic class II systolic heart failure, chronic atrial fibrillation, chronic left ventricular dysfunction, ejection fraction 25%, status post ICD implantation. The patient is s/p hip replacement surgery. He denies chest pain, shortness of breath, peripheral edema, syncope, or recent ICD shock. He has recently sold his house and moved into a condo at [employer].  No Known Allergies   Current Outpatient Prescriptions  Medication Sig Dispense Refill  . apixaban (ELIQUIS) 2.5 MG TABS tablet Take 1 tablet (2.5 mg total) by mouth 2 (two) times daily. 60 tablet 0  . atorvastatin (LIPITOR) 40 MG tablet Take 1 tablet by mouth daily.    . colchicine 0.6 MG tablet Take 0.6 mg by mouth daily.      . ferrous sulfate 325 (65 FE) MG tablet Take 325 mg by mouth daily with breakfast.    . furosemide (LASIX) 40 MG tablet Take 40 mg by mouth 2 (two) times daily.      . metoprolol (LOPRESSOR) 50 MG tablet Take 50 mg by mouth 2 (two) times daily.      . multivitamin (THERAGRAN) per tablet Take 1 tablet by mouth daily.      . potassium chloride SA (K-DUR,KLOR-CON) 20 MEQ tablet Take 20 mEq by mouth daily.      Marland Kitchen terazosin (HYTRIN) 5 MG capsule Take 1 capsule (5 mg total) by mouth at bedtime.    . valsartan (DIOVAN) 80 MG tablet Take 40 mg by mouth daily.    . vitamin C (ASCORBIC ACID) 500 MG tablet Take 500 mg by mouth daily.       No current facility-administered medications for this visit.     Past Medical History  Diagnosis Date  . Gout   . Hypertension   . Old anterior wall myocardial infarction   . Thrombocytopenia, immune   . Atrial fibrillation ([place])   . Hyperlipidemia   . ICD-Medtronic 11/25/2008    09/28/2007  Medtronic Sprint Quattro Secure S model 6935-65 (serial # [id number])                 Medtronic Virtuoso VR model D15VWC (serial # [id number]) single chamber ICD   . CAD (coronary  artery disease)     1985 Anterior MI 1985 with LIMA to LAD [place] PTCA to RCA x2 July 2009 Cath EF 20-25%  Patent LIMA to LAD, occluded LAD, Mild to moderate disease in RCA and Circ July 2014 Lexiscan Myoview  EF 32% anterior MI, no ischemia    . History of breast cancer in male   . Ischemic cardiomyopathy   . Osteoarthritis   . Breast cancer ([place])     dx at age 89  . BRCA2 genetic carrier 2014    ROS:   All systems reviewed and negative except as noted in the HPI.   Past Surgical History  Procedure Laterality Date  . Mastectomy, partial      left breast 1991  . Coronary artery bypass graft      LIMA to LAD in June 1985  . Eye surgery    . Joint replacement      bil  . Hip arthroplasty    . Cardiac catheterization      x4  . Total hip arthroplasty Right 12/29/2012    Procedure: TOTAL HIP ARTHROPLASTY WITH AUTOGRAFT;  Surgeon: Martin Rack., MD;  Location: [facility];  Service: Orthopedics;  Laterality: Right;     Family History  Problem Relation Age of Onset  . Heart attack Father   . Cancer Mother     dx in her 36s, ended up in her bones  . Cancer Brother     ended up in his bones  . Breast cancer Paternal Aunt     died in her late 73s  . Lung cancer Paternal Uncle   . Cancer Maternal Grandmother     ended up in her bones  . Colon cancer Paternal Grandmother   . Congestive Heart Failure Father      Social History   Social History  . Marital Status: Married    Spouse Name: N/A  . Number of Children: 3  . Years of Education: N/A   Occupational History  . Not on file.   Social History Main Topics  . Smoking status: Former Smoker -- 2.00 packs/day for 10 years    Types: Cigarettes    Quit date: 03/22/1964  . Smokeless tobacco: Not on file  . Alcohol Use: Yes  . Drug Use: Not on file  . Sexual Activity: Not on file   Other Topics Concern  . Not on file   Social History Narrative     BP 120/76 mmHg  Pulse 97  Ht _0  (1.702 m)  Wt  192 lb 6.4 oz (87.272 kg)  BMI 30.13 kg/m2  Physical Exam:  Well appearing elderly man, NAD HEENT: Unremarkable Neck:  6 cm JVD, no thyromegally Back:  No CVA tenderness Lungs:  Clear with no wheezes, rales, or rhonchi. Well-healed ICD incision. HEART:  IRegular rate rhythm, no murmurs, no rubs, no clicks Abd:  soft, positive bowel sounds, no organomegally, no rebound, no guarding Ext:  2 plus pulses, no edema, no cyanosis, no clubbing Skin:  No rashes no nodules Neuro:  CN II through XII intact, motor grossly intact  DEVICE  Normal device function.  See PaceArt for details.   Assess/Plan:

## 2014-12-26 NOTE — Assessment & Plan Note (Signed)
His ventricular rate is controlled. He will continue his current meds.  

## 2014-12-26 NOTE — Assessment & Plan Note (Signed)
His symptoms remain class 2. He will continue his current meds. I have encouraged him to increase his physical activity.

## 2014-12-26 NOTE — Patient Instructions (Signed)

## 2015-01-06 ENCOUNTER — Other Ambulatory Visit (HOSPITAL_BASED_OUTPATIENT_CLINIC_OR_DEPARTMENT_OTHER): Payer: Federal, State, Local not specified - PPO

## 2015-01-06 ENCOUNTER — Other Ambulatory Visit: Payer: Self-pay | Admitting: *Deleted

## 2015-01-06 DIAGNOSIS — Z853 Personal history of malignant neoplasm of breast: Secondary | ICD-10-CM | POA: Diagnosis not present

## 2015-01-06 DIAGNOSIS — C50322 Malignant neoplasm of lower-inner quadrant of left male breast: Secondary | ICD-10-CM

## 2015-01-06 DIAGNOSIS — N189 Chronic kidney disease, unspecified: Secondary | ICD-10-CM

## 2015-01-06 DIAGNOSIS — D696 Thrombocytopenia, unspecified: Secondary | ICD-10-CM | POA: Diagnosis not present

## 2015-01-06 LAB — CBC WITH DIFFERENTIAL/PLATELET
BASO%: 0.6 % (ref 0.0–2.0)
BASOS ABS: 0 10*3/uL (ref 0.0–0.1)
EOS%: 2.5 % (ref 0.0–7.0)
Eosinophils Absolute: 0.1 10*3/uL (ref 0.0–0.5)
HEMATOCRIT: 33 % — AB (ref 38.4–49.9)
HEMOGLOBIN: 10.9 g/dL — AB (ref 13.0–17.1)
LYMPH#: 0.5 10*3/uL — AB (ref 0.9–3.3)
LYMPH%: 9.2 % — ABNORMAL LOW (ref 14.0–49.0)
MCH: 33.8 pg — AB (ref 27.2–33.4)
MCHC: 33.1 g/dL (ref 32.0–36.0)
MCV: 102.1 fL — ABNORMAL HIGH (ref 79.3–98.0)
MONO#: 0.6 10*3/uL (ref 0.1–0.9)
MONO%: 11.1 % (ref 0.0–14.0)
NEUT%: 76.6 % — ABNORMAL HIGH (ref 39.0–75.0)
NEUTROS ABS: 4.3 10*3/uL (ref 1.5–6.5)
Platelets: 88 10*3/uL — ABNORMAL LOW (ref 140–400)
RBC: 3.23 10*6/uL — ABNORMAL LOW (ref 4.20–5.82)
RDW: 13.2 % (ref 11.0–14.6)
WBC: 5.7 10*3/uL (ref 4.0–10.3)

## 2015-01-06 LAB — COMPREHENSIVE METABOLIC PANEL (CC13)
ALT: 20 U/L (ref 0–55)
AST: 25 U/L (ref 5–34)
Albumin: 3.8 g/dL (ref 3.5–5.0)
Alkaline Phosphatase: 121 U/L (ref 40–150)
Anion Gap: 10 mEq/L (ref 3–11)
BUN: 36.5 mg/dL — ABNORMAL HIGH (ref 7.0–26.0)
CO2: 26 mEq/L (ref 22–29)
Calcium: 9.2 mg/dL (ref 8.4–10.4)
Chloride: 106 mEq/L (ref 98–109)
Creatinine: 1.8 mg/dL — ABNORMAL HIGH (ref 0.7–1.3)
EGFR: 35 mL/min/{1.73_m2} — ABNORMAL LOW (ref >90)
GLUCOSE: 132 mg/dL (ref 70–140)
POTASSIUM: 4.3 meq/L (ref 3.5–5.1)
SODIUM: 141 meq/L (ref 136–145)
TOTAL PROTEIN: 7.2 g/dL (ref 6.4–8.3)
Total Bilirubin: 1.28 mg/dL — ABNORMAL HIGH (ref 0.20–1.20)

## 2015-01-13 ENCOUNTER — Ambulatory Visit (HOSPITAL_BASED_OUTPATIENT_CLINIC_OR_DEPARTMENT_OTHER): Payer: Federal, State, Local not specified - PPO | Admitting: Oncology

## 2015-01-13 ENCOUNTER — Other Ambulatory Visit: Payer: Federal, State, Local not specified - PPO

## 2015-01-13 VITALS — BP 112/75 | HR 72 | Temp 97.8°F | Resp 18 | Ht 67.0 in | Wt 190.2 lb

## 2015-01-13 DIAGNOSIS — Z9221 Personal history of antineoplastic chemotherapy: Secondary | ICD-10-CM

## 2015-01-13 DIAGNOSIS — D693 Immune thrombocytopenic purpura: Secondary | ICD-10-CM

## 2015-01-13 DIAGNOSIS — Z853 Personal history of malignant neoplasm of breast: Secondary | ICD-10-CM

## 2015-01-13 DIAGNOSIS — C50322 Malignant neoplasm of lower-inner quadrant of left male breast: Secondary | ICD-10-CM

## 2015-01-13 DIAGNOSIS — Z1501 Genetic susceptibility to malignant neoplasm of breast: Secondary | ICD-10-CM

## 2015-01-13 NOTE — Progress Notes (Signed)
[place]  [phone]) [phone] [phone]) [phone]     ID: Martin [name] OB: 03-Jul-1930  Martin#: [id number]  BMW#:[id number]  PCP: Mathews Argyle, MD GYN:   SU:  OTHER MD: Tollie Eth MD  Marguerite Olea MD, Flossie Dibble MD  CHIEF COMPLAINT:  Breast cancer in a man with a BRCA2 gene   CURRENT TREATMENT: Observation  HISTORY OF PRESENT ILLNESS:  from the original consult note: Martin [name] is a 81 y.o.[employer], [place] man  referredfor evaluation of thrombocytopenia. In review, he was initially seen in consultation by Dr. Murriel Hopper at the [facility] on 03/31/2011. He was first told he was thrombocytopenic subsequent to completion of his chemotherapy. Platelet counts had remained stable for 20 years , only to s to fluctuate when he has acute illness.   As far as 2009 his lowest platelet count recorded that year was 92,000. Counts provided by his primary care physician go back as far as 12/29/2009 when platelet count was 88,000. Subsequently the counts have been in the 74-111,000 range except for 1 outlier from 01/28/11 which was recorded as 30,000. Repeat on 11/27 was 74,000. Platelet count in our office on 03/31/11 was 109,000.   Also, he has a prior history of left breast cancer diagnosed in 1991 and treated by Dr. Everardo All. The tumor was lymph node negative. He was treated with a mastectomy and then had chemotherapy followed by tamoxifen therapy. He does not remember which chemotherapy drugs he received.  He has had no problems with multiple surgeries in the past including bilateral knee replacements and a left hip replacement. He did not bruise easily. He had coronary bypass surgery in 1985 which antedated his breast cancer diagnosis and chemotherapy treatments. He denies any history of hepatitis, yellow jaundice, malaria, or mononucleosis. He is on a number of medications that could affect platelet function including chronic colchicine and allopurinol.  He admits to 2 large drinks daily with either bourbon or gin. He has a chronically elevated MCV up to 103. There is no family history of any bleeding disorder. He was on anticoagulation with Pradaxa for chronic atrial fibrillation and is status post a implantable defibrillator. Pradaxa was later changed to Eliquis 5 mg bid.   Dr Beryle Beams concluded that the patient's chronic thrombocytopenia was related to previous chemotherapy treatments for male breast cancer, with a mild macrocytic anemia due to fairly heavy alcohol use. Normal white count and differential was noted. He may have a mild myelodysplastic syndrome related to his previous chemotherapy treatments.Long-term use of both allopurinol and colchicine were addressed (these 2 meds were not discontinued as an outpatient). Bone marrow suppressive effects of alcohol were reviewed. No immune thrombocytopenia was suspected. Observation alone was recommended. He was to be seen as needed.   As outpatient he was recently on conservative treatments with NSAID's and corticosteriod injections for his osteoarthritis. Pain was severe, for which surgery was recommended.   After obtaining cardiology clearance, the patient was admitted on 12/29/2012 for a total right hip replacement with autograft. Status was complicated with postoperative hypotension, requiring pulmonary and critical care medicine involvement. He received 2 units of packed RBCs and 1 unit of platelets, IV fluid and pressors with good response. Of note, the patient has experienced CHF after fluid resuscitation requiring increased Lasix IV which may have affected his platelet count as well.   His subsequent history is as detailed below  INTERVAL HISTORY: Martin [name] for followup of his thrombocytopenia and  BRCA2 positive breast cancer history.  Since the last visit here he moved to [place] in [place]. He had been living in his prior home for 26 years so the move was fairly traumatic  both physically and practically as well as emotionally. He is now "done with that, " and satisfied with the move.   He was supposed to have had a mammogram last year but for some reason that did not happen  REVIEW OF SYSTEMS:  He wears a hearing aid and he has a defibrillator/pcemaker in place.  He "does not have a lot of energy". He has problems with gout and arthritis but these are not more intense or persistent than before. His skin cancer issues are followed by Dr. Nevada Crane. Overall a detailed review of systems [name] was stable  PAST MEDICAL HISTORY: Past Medical History  Diagnosis Date  . Gout   . Hypertension   . Old anterior wall myocardial infarction   . Thrombocytopenia, immune   . Atrial fibrillation ([place])   . Hyperlipidemia   . ICD-Medtronic 11/25/2008    09/28/2007  Medtronic Sprint Quattro Secure S model 6935-65 (serial # [id number])                 Medtronic Virtuoso VR model D15VWC (serial # [id number]) single chamber ICD   . CAD (coronary artery disease)     1985 Anterior MI 1985 with LIMA to LAD [place] PTCA to RCA x2 July 2009 Cath EF 20-25%  Patent LIMA to LAD, occluded LAD, Mild to moderate disease in RCA and Circ July 2014 Lexiscan Myoview  EF 32% anterior MI, no ischemia    . History of breast cancer in male   . Ischemic cardiomyopathy   . Osteoarthritis   . Breast cancer ([place])     dx at age 43  . BRCA2 genetic carrier 2014    PAST SURGICAL HISTORY: Past Surgical History  Procedure Laterality Date  . Mastectomy, partial      left breast 1991  . Coronary artery bypass graft      LIMA to LAD in June 1985  . Eye surgery    . Joint replacement      bil  . Hip arthroplasty    . Cardiac catheterization      x4  . Total hip arthroplasty Right 12/29/2012    Procedure: TOTAL HIP ARTHROPLASTY WITH AUTOGRAFT;  Surgeon: Yvette Rack., MD;  Location: [facility];  Service: Orthopedics;  Laterality: Right;    FAMILY HISTORY Family History  Problem Relation Age  of Onset  . Heart attack Father   . Cancer Mother     dx in her 32s, ended up in her bones  . Cancer Brother     ended up in his bones  . Breast cancer Paternal Aunt     died in her late 62s  . Lung cancer Paternal Uncle   . Cancer Maternal Grandmother     ended up in her bones  . Colon cancer Paternal Grandmother   . Congestive Heart Failure Father   he had one sibling, a brother who died at age 58 of metastatic cancer to bone, unknown primary. Paternal grandmother died with colon cancer. Mother died with lung disease. One uncle died with bone cancer. Maternal grandmother died with breast cancer. No family history of bleeding disorders.  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 He discontinued Tobacco use in 1966. He drinks 2 large bourbon origin drinks daily .  He is married. He has 3 healthy sons, 2 of them in town, several grand and great grand children. The patient's oldest son was tested and is negative for the BRCA gene. Son #2, Liliane Channel, is positive, and has 2 children (a son, negative, and a daughter, test pending). The third son is currently in [place] and  So far has resisted testing He has 2 daughters.     ADVANCED DIRECTIVES: in place   HEALTH MAINTENANCE: Social History  Substance Use Topics  . Smoking status: Former Smoker -- 2.00 packs/day for 10 years    Types: Cigarettes    Quit date: 03/22/1964  . Smokeless tobacco: Not on file  . Alcohol Use: Yes    No Known Allergies  Current Outpatient Prescriptions  Medication Sig Dispense Refill  . apixaban (ELIQUIS) 2.5 MG TABS tablet Take 1 tablet (2.5 mg total) by mouth 2 (two) times daily. 60 tablet 0  . atorvastatin (LIPITOR) 40 MG tablet Take 1 tablet by mouth daily.    . colchicine 0.6 MG tablet Take 0.6 mg by mouth daily.      . ferrous sulfate 325 (65 FE) MG tablet Take 325 mg by mouth daily with breakfast.    . furosemide (LASIX) 40 MG tablet Take 40 mg by mouth 2 (two) times daily.      .  metoprolol (LOPRESSOR) 50 MG tablet Take 50 mg by mouth 2 (two) times daily.      . multivitamin (THERAGRAN) per tablet Take 1 tablet by mouth daily.      . potassium chloride SA (K-DUR,KLOR-CON) 20 MEQ tablet Take 20 mEq by mouth daily.      Marland Kitchen terazosin (HYTRIN) 5 MG capsule Take 1 capsule (5 mg total) by mouth at bedtime.    . valsartan (DIOVAN) 80 MG tablet Take 40 mg by mouth daily.    . vitamin C (ASCORBIC ACID) 500 MG tablet Take 500 mg by mouth daily.       No current facility-administered medications for this visit.    OBJECTIVE: Elderly white male who appears stated age 37 Vitals:   01/13/15 1634  BP: 112/75  Pulse: 72  Temp: 97.8 F (36.6 C)  Resp: 18     Body mass index is 29.78 kg/(m^2).    ECOG FS:1 - Symptomatic but completely ambulatory  Sclerae unicteric, EOMs intact Oropharynx clear, dentition in good repair No cervical or supraclavicular adenopathy Lungs no rales or rhonchi Heart regular rhythm [name], no murmur appreciated Abd soft, nontender, positive bowel sounds , no splenomegaly MSK no focal spinal tenderness, no upper extremity lymphedema Neuro: nonfocal, well oriented, appropriate affect Breasts:  The right breast is unremarkable.  There are no palpable masses and there is no gynecomastia. The right axilla is benign. The left breast is status post mastectomy. There is no evidence of chest wall recurrence. Left axilla is benign   LAB RESULTS:  CMP     Component Value Date/Time   NA 141 01/06/2015 1524   NA 137 01/05/2013 0912   K 4.3 01/06/2015 1524   K 3.3* 01/05/2013 0912   CL 98 01/05/2013 0912   CO2 26 01/06/2015 1524   CO2 27 01/05/2013 0912   GLUCOSE 132 01/06/2015 1524   GLUCOSE 152* 01/05/2013 0912   BUN 36.5* 01/06/2015 1524   BUN 30* 01/05/2013 0912   CREATININE 1.8* 01/06/2015 1524   CREATININE 1.41* 01/05/2013 0912   CALCIUM 9.2 01/06/2015 1524   CALCIUM 8.6 01/05/2013 0912   PROT 7.2 01/06/2015  1524   PROT 8.0 12/21/2012 1124     ALBUMIN 3.8 01/06/2015 1524   ALBUMIN 4.1 12/21/2012 1124   AST 25 01/06/2015 1524   AST 27 12/21/2012 1124   ALT 20 01/06/2015 1524   ALT 23 12/21/2012 1124   ALKPHOS 121 01/06/2015 1524   ALKPHOS 62 12/21/2012 1124   BILITOT 1.28* 01/06/2015 1524   BILITOT 1.0 12/21/2012 1124   GFRNONAA 45* 01/05/2013 0912   GFRAA 53* 01/05/2013 0912    I No results found for: SPEP  Lab Results  Component Value Date   WBC 5.7 01/06/2015   NEUTROABS 4.3 01/06/2015   HGB 10.9* 01/06/2015   HCT 33.0* 01/06/2015   MCV 102.1* 01/06/2015   PLT 88* 01/06/2015      Chemistry      Component Value Date/Time   NA 141 01/06/2015 1524   NA 137 01/05/2013 0912   K 4.3 01/06/2015 1524   K 3.3* 01/05/2013 0912   CL 98 01/05/2013 0912   CO2 26 01/06/2015 1524   CO2 27 01/05/2013 0912   BUN 36.5* 01/06/2015 1524   BUN 30* 01/05/2013 0912   CREATININE 1.8* 01/06/2015 1524   CREATININE 1.41* 01/05/2013 0912      Component Value Date/Time   CALCIUM 9.2 01/06/2015 1524   CALCIUM 8.6 01/05/2013 0912   ALKPHOS 121 01/06/2015 1524   ALKPHOS 62 12/21/2012 1124   AST 25 01/06/2015 1524   AST 27 12/21/2012 1124   ALT 20 01/06/2015 1524   ALT 23 12/21/2012 1124   BILITOT 1.28* 01/06/2015 1524   BILITOT 1.0 12/21/2012 1124       No results found for: LABCA2  No components found for: LABCA125  No results for input(s): INR in the last 168 hours.  Urinalysis    Component Value Date/Time   COLORURINE YELLOW 12/21/2012 1124   APPEARANCEUR CLEAR 12/21/2012 1124   LABSPEC 1.010 12/21/2012 1124   PHURINE 7.0 12/21/2012 1124   GLUCOSEU NEGATIVE 12/21/2012 1124   HGBUR TRACE* 12/21/2012 1124   BILIRUBINUR NEGATIVE 12/21/2012 1124   KETONESUR NEGATIVE 12/21/2012 1124   PROTEINUR NEGATIVE 12/21/2012 1124   UROBILINOGEN 0.2 12/21/2012 1124   NITRITE NEGATIVE 12/21/2012 1124   LEUKOCYTESUR NEGATIVE 12/21/2012 1124    STUDIES: CLINICAL DATA: History of left breast cancer status  post mastectomy. Patient complains of right breast tenderness. He is BRCA2 positive.  EXAM: DIGITAL DIAGNOSTIC UNILATERAL RIGHT MAMMOGRAM WITH TOMOSYNTHESIS AND CAD  COMPARISON: Prior mammograms dated 12/11/2012 and 12/30/2004.  ACR Breast Density Category b: There are scattered areas of fibroglandular density.  FINDINGS: No suspicious mass, malignant type microcalcifications or distortion detected.  Mammographic images were processed with CAD.  IMPRESSION: No evidence of malignancy in the right breast.  RECOMMENDATION: Unilateral right screening mammogram in 1 year is recommended.  I have discussed the findings and recommendations with the patient. Results were also provided in writing at the conclusion of the visit. If applicable, a reminder letter will be sent to the patient regarding the next appointment.  BI-RADS CATEGORY 1: Negative.   Electronically Signed  By: Lillia Mountain M.D.  On: 05/11/2013 12:06   ASSESSMENT: 81 y.o. [place] man  (1) BRCA-2 positive [id number])  (2) status post right radical mastectomy for node negativebreast cancer 1991,  Treated adjuvantly with chemotherapy, then tamoxifen for 5 years  (3) chronic immune thrombocytopenia,  Moderate andstable  (4) atrial fibrillation with a rapid ventricular response, on apixaban/ Eliquis  (5) ischemic cardiomyopathy  (6) stage III chronic kidney  injury  PLAN: Martin. [name] is  Is doing fine from a breast cancer point of view. At this point I feel comfortable releasing him to his primary care physician, Dr. Felipa Eth. All Martin. [name] will need in terms of breast cancer screening is yearly right mammogram and a yearly physician breast and chest wall exam.  I am not sure (neither is Martin. [name]) why he did not have a mammogram this year. We are putting that in for December. He tells me he will be seeing Dr. Felipa Eth in January some time so the results can be reviewed at that  time.  The only one of his sons who has not yet been tested lives in [place] but will be visiting for the Christmas holidays. If Martin [name] would like me to meet with him or just have a meet with one of our genetics counselors while he is in time we can arrange for that  The patient's thrombocytopenia requires no intervention unless the platelet count falls below 50,000 on consecutive determinations.   I will be glad to see Martin. [name] at any point in the future if and when the need arises. As of now however we are making no further routine appointments for him here Chauncey Cruel, MD   01/13/2015 4:44 PM

## 2015-05-08 ENCOUNTER — Ambulatory Visit
Admission: RE | Admit: 2015-05-08 | Discharge: 2015-05-08 | Disposition: A | Discharge status: Home / Self Care | Payer: Federal, State, Local not specified - PPO | Source: Ambulatory Visit | Attending: Oncology | Admitting: Oncology

## 2015-05-08 DIAGNOSIS — C50322 Malignant neoplasm of lower-inner quadrant of left male breast: Secondary | ICD-10-CM

## 2015-07-10 ENCOUNTER — Other Ambulatory Visit: Payer: Self-pay | Admitting: Cardiology

## 2015-07-21 ENCOUNTER — Inpatient Hospital Stay (HOSPITAL_COMMUNITY)
Admission: AD | Admit: 2015-07-21 | Discharge: 2015-08-06 | DRG: 291 | Disposition: A | Discharge status: Skilled Nursing Facility | Payer: Medicare Other | Source: Ambulatory Visit | Attending: Cardiology | Admitting: Cardiology

## 2015-07-21 ENCOUNTER — Other Ambulatory Visit: Payer: Self-pay | Admitting: Cardiology

## 2015-07-21 DIAGNOSIS — Z96653 Presence of artificial knee joint, bilateral: Secondary | ICD-10-CM | POA: Diagnosis present

## 2015-07-21 DIAGNOSIS — R570 Cardiogenic shock: Secondary | ICD-10-CM | POA: Diagnosis not present

## 2015-07-21 DIAGNOSIS — Z7901 Long term (current) use of anticoagulants: Secondary | ICD-10-CM

## 2015-07-21 DIAGNOSIS — I480 Paroxysmal atrial fibrillation: Secondary | ICD-10-CM | POA: Diagnosis not present

## 2015-07-21 DIAGNOSIS — N186 End stage renal disease: Secondary | ICD-10-CM

## 2015-07-21 DIAGNOSIS — I482 Chronic atrial fibrillation: Secondary | ICD-10-CM | POA: Diagnosis present

## 2015-07-21 DIAGNOSIS — M109 Gout, unspecified: Secondary | ICD-10-CM | POA: Diagnosis present

## 2015-07-21 DIAGNOSIS — Z87891 Personal history of nicotine dependence: Secondary | ICD-10-CM | POA: Diagnosis not present

## 2015-07-21 DIAGNOSIS — Z853 Personal history of malignant neoplasm of breast: Secondary | ICD-10-CM

## 2015-07-21 DIAGNOSIS — I255 Ischemic cardiomyopathy: Secondary | ICD-10-CM | POA: Diagnosis present

## 2015-07-21 DIAGNOSIS — D649 Anemia, unspecified: Secondary | ICD-10-CM | POA: Diagnosis present

## 2015-07-21 DIAGNOSIS — Z66 Do not resuscitate: Secondary | ICD-10-CM | POA: Diagnosis present

## 2015-07-21 DIAGNOSIS — Z992 Dependence on renal dialysis: Secondary | ICD-10-CM

## 2015-07-21 DIAGNOSIS — R6521 Severe sepsis with septic shock: Secondary | ICD-10-CM | POA: Diagnosis not present

## 2015-07-21 DIAGNOSIS — I130 Hypertensive heart and chronic kidney disease with heart failure and stage 1 through stage 4 chronic kidney disease, or unspecified chronic kidney disease: Secondary | ICD-10-CM | POA: Diagnosis present

## 2015-07-21 DIAGNOSIS — N170 Acute kidney failure with tubular necrosis: Secondary | ICD-10-CM | POA: Diagnosis not present

## 2015-07-21 DIAGNOSIS — R34 Anuria and oliguria: Secondary | ICD-10-CM | POA: Diagnosis not present

## 2015-07-21 DIAGNOSIS — N189 Chronic kidney disease, unspecified: Secondary | ICD-10-CM | POA: Diagnosis not present

## 2015-07-21 DIAGNOSIS — Z79899 Other long term (current) drug therapy: Secondary | ICD-10-CM | POA: Diagnosis not present

## 2015-07-21 DIAGNOSIS — L8990 Pressure ulcer of unspecified site, unspecified stage: Secondary | ICD-10-CM | POA: Insufficient documentation

## 2015-07-21 DIAGNOSIS — Z951 Presence of aortocoronary bypass graft: Secondary | ICD-10-CM | POA: Diagnosis not present

## 2015-07-21 DIAGNOSIS — I2510 Atherosclerotic heart disease of native coronary artery without angina pectoris: Secondary | ICD-10-CM | POA: Diagnosis present

## 2015-07-21 DIAGNOSIS — Z95810 Presence of automatic (implantable) cardiac defibrillator: Secondary | ICD-10-CM | POA: Diagnosis not present

## 2015-07-21 DIAGNOSIS — R310 Gross hematuria: Secondary | ICD-10-CM | POA: Diagnosis not present

## 2015-07-21 DIAGNOSIS — I5023 Acute on chronic systolic (congestive) heart failure: Secondary | ICD-10-CM | POA: Diagnosis present

## 2015-07-21 DIAGNOSIS — R05 Cough: Secondary | ICD-10-CM

## 2015-07-21 DIAGNOSIS — I509 Heart failure, unspecified: Secondary | ICD-10-CM | POA: Diagnosis not present

## 2015-07-21 DIAGNOSIS — N390 Urinary tract infection, site not specified: Secondary | ICD-10-CM | POA: Diagnosis not present

## 2015-07-21 DIAGNOSIS — E785 Hyperlipidemia, unspecified: Secondary | ICD-10-CM | POA: Diagnosis present

## 2015-07-21 DIAGNOSIS — R0600 Dyspnea, unspecified: Secondary | ICD-10-CM | POA: Diagnosis not present

## 2015-07-21 DIAGNOSIS — J9600 Acute respiratory failure, unspecified whether with hypoxia or hypercapnia: Secondary | ICD-10-CM | POA: Diagnosis not present

## 2015-07-21 DIAGNOSIS — I252 Old myocardial infarction: Secondary | ICD-10-CM | POA: Diagnosis not present

## 2015-07-21 DIAGNOSIS — Z955 Presence of coronary angioplasty implant and graft: Secondary | ICD-10-CM | POA: Diagnosis not present

## 2015-07-21 DIAGNOSIS — E669 Obesity, unspecified: Secondary | ICD-10-CM | POA: Diagnosis present

## 2015-07-21 DIAGNOSIS — R6520 Severe sepsis without septic shock: Secondary | ICD-10-CM | POA: Diagnosis not present

## 2015-07-21 DIAGNOSIS — Z96642 Presence of left artificial hip joint: Secondary | ICD-10-CM | POA: Diagnosis present

## 2015-07-21 DIAGNOSIS — E876 Hypokalemia: Secondary | ICD-10-CM | POA: Diagnosis not present

## 2015-07-21 DIAGNOSIS — A419 Sepsis, unspecified organism: Secondary | ICD-10-CM | POA: Diagnosis not present

## 2015-07-21 DIAGNOSIS — R069 Unspecified abnormalities of breathing: Secondary | ICD-10-CM

## 2015-07-21 DIAGNOSIS — Z6830 Body mass index (BMI) 30.0-30.9, adult: Secondary | ICD-10-CM

## 2015-07-21 DIAGNOSIS — N184 Chronic kidney disease, stage 4 (severe): Secondary | ICD-10-CM | POA: Diagnosis present

## 2015-07-21 DIAGNOSIS — J9601 Acute respiratory failure with hypoxia: Secondary | ICD-10-CM | POA: Diagnosis not present

## 2015-07-21 DIAGNOSIS — R609 Edema, unspecified: Secondary | ICD-10-CM | POA: Diagnosis present

## 2015-07-21 DIAGNOSIS — N289 Disorder of kidney and ureter, unspecified: Secondary | ICD-10-CM

## 2015-07-21 DIAGNOSIS — Y848 Other medical procedures as the cause of abnormal reaction of the patient, or of later complication, without mention of misadventure at the time of the procedure: Secondary | ICD-10-CM | POA: Diagnosis not present

## 2015-07-21 DIAGNOSIS — T8242XA Displacement of vascular dialysis catheter, initial encounter: Secondary | ICD-10-CM | POA: Diagnosis not present

## 2015-07-21 DIAGNOSIS — N19 Unspecified kidney failure: Secondary | ICD-10-CM | POA: Diagnosis not present

## 2015-07-21 DIAGNOSIS — I5021 Acute systolic (congestive) heart failure: Secondary | ICD-10-CM

## 2015-07-21 DIAGNOSIS — D696 Thrombocytopenia, unspecified: Secondary | ICD-10-CM | POA: Diagnosis present

## 2015-07-21 DIAGNOSIS — Z515 Encounter for palliative care: Secondary | ICD-10-CM | POA: Diagnosis not present

## 2015-07-21 DIAGNOSIS — R059 Cough, unspecified: Secondary | ICD-10-CM

## 2015-07-21 DIAGNOSIS — N179 Acute kidney failure, unspecified: Secondary | ICD-10-CM | POA: Diagnosis not present

## 2015-07-21 DIAGNOSIS — Z452 Encounter for adjustment and management of vascular access device: Secondary | ICD-10-CM

## 2015-07-21 HISTORY — DX: Reserved for inherently not codable concepts without codable children: IMO0001

## 2015-07-21 LAB — COMPREHENSIVE METABOLIC PANEL
ALBUMIN: 3.5 g/dL (ref 3.5–5.0)
ALK PHOS: 132 U/L — AB (ref 38–126)
ALT: 25 U/L (ref 17–63)
AST: 33 U/L (ref 15–41)
Anion gap: 13 (ref 5–15)
BILIRUBIN TOTAL: 2 mg/dL — AB (ref 0.3–1.2)
BUN: 51 mg/dL — AB (ref 6–20)
CALCIUM: 9.1 mg/dL (ref 8.9–10.3)
CO2: 22 mmol/L (ref 22–32)
Chloride: 102 mmol/L (ref 101–111)
Creatinine, Ser: 2.25 mg/dL — ABNORMAL HIGH (ref 0.61–1.24)
GFR calc Af Amer: 30 mL/min — ABNORMAL LOW (ref >60)
GFR calc non Af Amer: 25 mL/min — ABNORMAL LOW (ref >60)
GLUCOSE: 125 mg/dL — AB (ref 65–99)
Potassium: 5.2 mmol/L — ABNORMAL HIGH (ref 3.5–5.1)
SODIUM: 137 mmol/L (ref 135–145)
TOTAL PROTEIN: 7.7 g/dL (ref 6.5–8.1)

## 2015-07-21 LAB — BRAIN NATRIURETIC PEPTIDE: B Natriuretic Peptide: 1660.5 pg/mL — ABNORMAL HIGH (ref 0.0–100.0)

## 2015-07-21 LAB — CBC WITH DIFFERENTIAL/PLATELET
BASOS PCT: 0 %
Basophils Absolute: 0 10*3/uL (ref 0.0–0.1)
EOS ABS: 0 10*3/uL (ref 0.0–0.7)
Eosinophils Relative: 0 %
HEMATOCRIT: 31.3 % — AB (ref 39.0–52.0)
HEMOGLOBIN: 10.1 g/dL — AB (ref 13.0–17.0)
Lymphocytes Relative: 4 %
Lymphs Abs: 0.4 10*3/uL — ABNORMAL LOW (ref 0.7–4.0)
MCH: 33.4 pg (ref 26.0–34.0)
MCHC: 32.3 g/dL (ref 30.0–36.0)
MCV: 103.6 fL — ABNORMAL HIGH (ref 78.0–100.0)
MONOS PCT: 13 %
Monocytes Absolute: 1.2 10*3/uL — ABNORMAL HIGH (ref 0.1–1.0)
NEUTROS ABS: 7.5 10*3/uL (ref 1.7–7.7)
NEUTROS PCT: 83 %
Platelets: 93 10*3/uL — ABNORMAL LOW (ref 150–400)
RBC: 3.02 MIL/uL — AB (ref 4.22–5.81)
RDW: 13.9 % (ref 11.5–15.5)
WBC: 9.1 10*3/uL (ref 4.0–10.5)

## 2015-07-21 LAB — TSH: TSH: 0.514 u[IU]/mL (ref 0.350–4.500)

## 2015-07-21 MED ORDER — SODIUM CHLORIDE 0.9% FLUSH: 3.0000 mL | INTRAVENOUS | Status: DC | PRN

## 2015-07-21 MED ORDER — METOPROLOL SUCCINATE ER 50 MG PO TB24
50.0000 mg | ORAL_TABLET | Freq: Every day | ORAL | Status: DC
  Administered 2015-07-22 – 2015-07-23 (×2): 50 mg via ORAL
  Filled 2015-07-21 (×2): qty 1

## 2015-07-21 MED ORDER — APIXABAN 2.5 MG PO TABS
2.5000 mg | ORAL_TABLET | Freq: Two times a day (BID) | ORAL | Status: DC
  Administered 2015-07-21 – 2015-07-25 (×9): 2.5 mg via ORAL
  Filled 2015-07-21 (×9): qty 1

## 2015-07-21 MED ORDER — TERAZOSIN HCL 5 MG PO CAPS
5.0000 mg | ORAL_CAPSULE | Freq: Every day | ORAL | Status: DC
  Administered 2015-07-21 – 2015-07-23 (×2): 5 mg via ORAL
  Filled 2015-07-21 (×4): qty 1

## 2015-07-21 MED ORDER — ACETAMINOPHEN 325 MG PO TABS
650.0000 mg | ORAL_TABLET | ORAL | Status: DC | PRN
  Administered 2015-07-21 – 2015-07-22 (×4): 650 mg via ORAL
  Filled 2015-07-21 (×4): qty 2

## 2015-07-21 MED ORDER — SODIUM CHLORIDE 0.9 % IV SOLN
250.0000 mL | INTRAVENOUS | Status: DC | PRN
  Administered 2015-07-30: 250 mL via INTRAVENOUS

## 2015-07-21 MED ORDER — FERROUS SULFATE 325 (65 FE) MG PO TABS
325.0000 mg | ORAL_TABLET | Freq: Every day | ORAL | Status: DC
  Administered 2015-07-22 – 2015-08-05 (×15): 325 mg via ORAL
  Filled 2015-07-21 (×15): qty 1

## 2015-07-21 MED ORDER — DEXTROSE 5 % IV SOLN
160.0000 mg | Freq: Two times a day (BID) | INTRAVENOUS | Status: DC
  Administered 2015-07-21 – 2015-07-23 (×4): 160 mg via INTRAVENOUS
  Filled 2015-07-21 (×6): qty 16

## 2015-07-21 MED ORDER — ONDANSETRON HCL 4 MG/2ML IJ SOLN: 4.0000 mg | Freq: Four times a day (QID) | INTRAMUSCULAR | Status: DC | PRN

## 2015-07-21 MED ORDER — SODIUM CHLORIDE 0.9% FLUSH
3.0000 mL | Freq: Two times a day (BID) | INTRAVENOUS | Status: DC
  Administered 2015-07-21 – 2015-08-04 (×14): 3 mL via INTRAVENOUS

## 2015-07-21 MED ORDER — IRBESARTAN 75 MG PO TABS
37.5000 mg | ORAL_TABLET | Freq: Every day | ORAL | Status: DC
  Filled 2015-07-21 (×2): qty 0.5

## 2015-07-21 NOTE — H&P (Signed)
Martin [name]  Date of visit:  07/21/2015 DOB:  1930-10-03    Age:  82 yrs. Medical record number:  [id number]     Account number:  [id number] Primary Care Provider: Felipa Eth, HAL T ____________________________ CURRENT DIAGNOSES  1. Acute on chronic systolic (congestive) heart failure  2. Chronic atrial fibrillation  3. CAD Native without angina  4. Essential (primary) hypertension  5. Long term (current) use of anticoagulants  6. Hyperlipidemia  7. Obesity  8. Chronic kidney disease, stage 3 (moderate)  9. Presence of automatic (implantable) cardiac defibrillator  10. Personal history of malignant neoplasm of breast  11. Presence of aortocoronary bypass graft  12. Retinal Artery branch occlusion  13. Thrombocytopenia, unspecified  14. Gout, unspecified  15. Old myocardial infarction ____________________________ ALLERGIES  heparin, Intolerance-unknown  Tikosyn, Intolerance-unknown ____________________________ MEDICATIONS  1. Vitamin C 500 mg tablet, Daily  2. Multivitamins Tab, 1 p.o. daily  3. ferrous sulfate 325 mg (65 mg iron) tablet, 1 p.o. daily  4. nitroglycerin 0.4 mg sublingual tablet, PRN  5. metoprolol succinate ER 100 mg tablet,extended release 24 hr, 1 p.o. daily  6. valsartan 80 mg tablet, 1/2 tab daily  7. atorvastatin 40 mg tablet, 1 p.o. daily  8. Eliquis 2.5 mg tablet, BID  9. Klor-Con M20 mEq tablet,extended release, 2 qd  10. terazosin 5 mg capsule, QHS  11. furosemide 80 mg tablet, take one po BID ____________________________ CHIEF COMPLAINTS  Dyspnea and edema  Followup of Chronic systolic (congestive) heart failure ____________________________ HISTORY OF PRESENT ILLNESS This 82 year old male is admitted to the hospital for treatment of acute on chronic systolic heart failure. Past history of coronary artery disease with previous anterior infarction will and previous bypass grafting as well as percutaneous intervention to the right coronary artery. He  has chronic atrial fibrillation and has been on anticoagulation. He recently had become ill complaining of about 3 weeks and some diarrhea, changes in his hair and then began to develop worsening pedal edema. His furosemide was increased but he developed some increasing creatinine. I saw him in the office about 10 days ago and he gained additional weight was edematous and felt very poor. Colchicine was stopped and he continued on higher dose Lasix. He has improved somewhat but has gained additional weight since he was here. He comes into the office today barely able to walk more than half a block without stopping to rest. He does not have PND orthopnea. He also complained of numbness in his hands as well as arm pain and was having difficulty sleeping at night. His neck veins are elevated today and he clearly has decompensated despite increasing diuretics as an outpatient. In echocardiogram in the office today showed marked left atrial enlargement, ejection fraction of 25% with akinesis of the anteroapical segment and mild pulmonary hypertension. He is admitted to the hospital for treatment of worsening congestive heart failure. ___________________________ PAST HISTORY  Past Medical Illnesses:  hyperlipidemia, obesity, carcinoma-breast, hypertension, gout, osteoarthritis, BPH, thrombocytopenia, history of carpal tunnel syndrome, retinal branch occlusion October 2012;  Cardiovascular Illnesses:  CAD, S/P MI-anterior 1985, atrial fibrillation with failed cardioversion x 2 and failure on Tikosyn and amiodarone;  Surgical Procedures:  CABG w LIMA to LAD 1985 in [place], knee replacement-rt, breast mastectomy left, tonsillectomy/adenoids, knee replacement-left, hip replacement-left December 2006;  NYHA Classification:  II;  Canadian Angina Classification:  Class 0: Asymptomatic;  Cardiology Procedures-Invasive:  PTCA of the RCA December 1995, cardioversion January and April 2009, cardiac cath (left) July 2009,  Medtronic AICD implant July 2009;  Cardiology Procedures-Noninvasive:  adenosine cardiolite November 2006, echocardiogram February 2011, echocardiogram July 2013;  Cardiac Cath Results:  normal Left main, 90% stenosis proximal Diag 1, occluded LAD, no significant disease CFX, 90% stenosis proximal RCA, 90% stenosis distal RCA;  LVEF of 25% documented via echocardiogram on 07/21/2015,  CHADS Score:  2,  CHA2DS2-VASC Score:  4 ____________________________ CARDIO-PULMONARY TEST DATES EKG Date:  07/09/2014;   Cardiac Cath Date:  09/28/2007;  CABG: 03/23/1983;  Stent Placement Date: 03/16/1994;  Holter/Event Monitor Date: 10/04/2007;  Nuclear Study Date:  02/03/2005;  Echocardiography Date: 07/21/2015;  Chest Xray Date: 07/24/2007;   ____________________________ FAMILY HISTORY Brother -- Brother dead, Cancer Father -- Father dead, Congestive heart failure, Deceased Mother -- Mother dead, CVA, Cancer ____________________________ SOCIAL HISTORY Alcohol Use:  drinks regularly 1-2 per day;  Smoking:  used to smoke but quit Prior to 1980;  Diet:  regular diet without modifications;  Lifestyle:  married;  Exercise:  no regular exercise;  Occupation:  IRS and retired;  Residence:  lives with wife;   ____________________________ REVIEW OF SYSTEMS General:  weight gain of approximately 5 lbs  Integumentary:hair changes Eyes: wears eye glasses/contact lenses, retinal branch occlusion Respiratory: dyspnea with exertion Cardiovascular:  please review HPI Abdominal: intermittent dyspepsia and diarrhea Genitourinary-Male: hesitancy, nocturia  Musculoskeletal:  arms and hands hurt Neurological:  gait disturbance Psychiatric:  denies depression or anxiety Hematological/Immunologic:  thrombocytopenia ____________________________ PHYSICAL EXAMINATION VITAL SIGNS  Blood Pressure:  90/54 Sitting, Left arm, regular cuff  , 90/50 Standing, Left arm and regular cuff   Pulse:  64/min. Weight:  196.00 lbs. Height:  67"BMI:  30  Constitutional:  pleasant white male in no acute distress, chronically ill appearing Skin:  warm and dry to touch, no apparent skin lesions, or masses noted. Head:  normocephalic, balding male hair pattern Eyes:  EOMS Intact, PERRLA, C and S clear, Funduscopic exam not done. ENT:  ears, nose and throat reveal no gross abnormalities.  Dentition good. Neck:  JVD at 45 degrees 4cm Chest:  clear to auscultation, findings consistent with left mastectomy, healed median sternotomy scar, healed ICD incision in the left pectoral area Cardiac:  irregular rhythm, normal S1 and S2, grade 2/6 systolic murmur Abdomen:  abdomen soft,non-tender, no masses, no hepatospenomegaly, or aneurysm noted Peripheral Pulses:  pulses full and equal in all extremities Extremities & Back:  2+ edema, no spinal abnormalities noted. Neurological:  unsteady gait ____________________________ MOST RECENT LIPID PANEL 06/03/15  LDL 45 NM, HDL 44 mg/dl, ALT 27 u/l, ALK PHOS 115 u/l and AST 21 u/l ____________________________ IMPRESSIONS/PLAN 1. Acute on chronic systolic congestive heart failure 2. Hypertensive heart disease 3. Chronic atrial fibrillation 4. Long-term use of anticoagulation 5. Functioning implantable defibrillator 6. History of breast cancer 7. Thrombocytopenia chronic 8. History of gout 9. Old anterior myocardial infarction  Recommendations:  He is brought in for intravenous diuresis. We'll consider changing him to Entresto on this admission. We'll need to follow serial creatinine and check basic labs. He will have his metoprolol dose reduced during this period of decompensation.   ____________________________ Cardiology Physician:  W. Spencer Tilley, Jr. MD FACC     

## 2015-07-22 ENCOUNTER — Inpatient Hospital Stay (HOSPITAL_COMMUNITY): Payer: Medicare Other

## 2015-07-22 ENCOUNTER — Encounter (HOSPITAL_COMMUNITY): Payer: Self-pay | Admitting: General Practice

## 2015-07-22 LAB — IRON AND TIBC
IRON: 32 ug/dL — AB (ref 45–182)
Saturation Ratios: 12 % — ABNORMAL LOW (ref 17.9–39.5)
TIBC: 265 ug/dL (ref 250–450)
UIBC: 233 ug/dL

## 2015-07-22 LAB — BASIC METABOLIC PANEL
ANION GAP: 11 (ref 5–15)
BUN: 55 mg/dL — ABNORMAL HIGH (ref 6–20)
CO2: 23 mmol/L (ref 22–32)
Calcium: 8.9 mg/dL (ref 8.9–10.3)
Chloride: 102 mmol/L (ref 101–111)
Creatinine, Ser: 2.21 mg/dL — ABNORMAL HIGH (ref 0.61–1.24)
GFR calc Af Amer: 30 mL/min — ABNORMAL LOW (ref >60)
GFR calc non Af Amer: 26 mL/min — ABNORMAL LOW (ref >60)
GLUCOSE: 135 mg/dL — AB (ref 65–99)
POTASSIUM: 4.1 mmol/L (ref 3.5–5.1)
Sodium: 136 mmol/L (ref 135–145)

## 2015-07-22 LAB — RETICULOCYTES
RBC.: 2.65 MIL/uL — ABNORMAL LOW (ref 4.22–5.81)
RETIC CT PCT: 0.9 % (ref 0.4–3.1)
Retic Count, Absolute: 23.9 10*3/uL (ref 19.0–186.0)

## 2015-07-22 LAB — FOLATE: Folate: 55.2 ng/mL (ref >5.9)

## 2015-07-22 LAB — VITAMIN B12: VITAMIN B 12: 329 pg/mL (ref 180–914)

## 2015-07-22 LAB — FERRITIN: Ferritin: 400 ng/mL — ABNORMAL HIGH (ref 24–336)

## 2015-07-22 MED ORDER — DIGOXIN 0.25 MG/ML IJ SOLN
0.2500 mg | Freq: Four times a day (QID) | INTRAMUSCULAR | Status: AC
  Administered 2015-07-22 (×2): 0.25 mg via INTRAVENOUS
  Filled 2015-07-22 (×2): qty 2

## 2015-07-22 MED ORDER — METOLAZONE 5 MG PO TABS
5.0000 mg | ORAL_TABLET | Freq: Every day | ORAL | Status: AC
  Administered 2015-07-22 – 2015-07-23 (×2): 5 mg via ORAL
  Filled 2015-07-22 (×2): qty 1

## 2015-07-22 MED ORDER — SACUBITRIL-VALSARTAN 24-26 MG PO TABS
1.0000 | ORAL_TABLET | Freq: Two times a day (BID) | ORAL | Status: DC
  Administered 2015-07-22 (×2): 1 via ORAL
  Filled 2015-07-22 (×3): qty 1

## 2015-07-22 NOTE — Progress Notes (Signed)
Subjective:  Still dyspneic and doesn't feel well  Objective:  Vital Signs in the last 24 hours: BP 116/77 mmHg  Pulse 104  Temp(Src) 98.3 F (36.8 C) (Oral)  Resp 18  Ht 5\' 7"  (1.702 m)  Wt 87.363 kg (192 lb 9.6 oz)  BMI 30.16 kg/m2  SpO2 91%  Physical Exam: Elderly male in with mild dyspnea at rest  Neck veins elevated at 30 with some V waves Lungs:  Reduced breath sounds at the right base Cardiac:  Irregular rhythm rhythm, normal S1 and S2, no S3 Abdomen:  Soft, nontender, no masses Extremities:  2+ edema present  Intake/Output from previous day: 05/01 0701 - 05/02 0700 In: 350 [P.O.:350] Out: 900 [Urine:900] Weight Filed Weights   07/21/15 1719 07/22/15 0701  Weight: 87.68 kg (193 lb 4.8 oz) 87.363 kg (192 lb 9.6 oz)    Lab Results: Basic Metabolic Panel:  Recent Labs  07/21/15 1838 07/22/15 0333  NA 137 136  K 5.2* 4.1  CL 102 102  CO2 22 23  GLUCOSE 125* 135*  BUN 51* 55*  CREATININE 2.25* 2.21*    CBC:  Recent Labs  07/21/15 1838  WBC 9.1  NEUTROABS 7.5  HGB 10.1*  HCT 31.3*  MCV 103.6*  PLT 93*    BNP    Component Value Date/Time   BNP 1660.5* 07/21/2015 1838    PROTIME: Lab Results  Component Value Date   INR 1.50* 01/04/2013   INR 1.37 01/02/2013   INR 1.64* 01/01/2013    Telemetry: Atrial fibrillation with rate somewhat increased today  Assessment/Plan:  1.  Acute on chronic systolic heart failure with minimal diuresis overnight 2.  Atrial fibrillation rate somewhat up this morning 3.  Stage III chronic kidney disease somewhat worse 4.  Anemia 5.  Ischemic cardio myopathy with previous bypass grafting and PCI of the right coronary artery   6.  Long-term use of anticoagulation  Recommendations:  Weight has not gone down much and still edematous and dyspneic with neck vein elevation.  We'll evaluate anemia and also check renal ultrasound.  Add Zaroxolyn to help with diuresis.  Will start Entresto at a low-dose and add  digoxin to help with rate control.     Kerry Hough  MD [facility] Cardiology  07/22/2015, 9:05 AM

## 2015-07-22 NOTE — Research (Signed)
[email or web address]  Informed Consent   Subject Name: Martin [name]  Subject met inclusion and exclusion criteria.  The informed consent form, study requirements and expectations were reviewed with the subject and questions and concerns were addressed prior to the signing of the consent form.  The subject verbalized understanding of the trail requirements.  The subject agreed to participate in the [email or web address]  trial and signed the informed consent.  The informed consent was obtained prior to performance of any protocol-specific procedures for the subject.  A copy of the signed informed consent was given to the subject and a copy was placed in the subject's medical record.  Sandie Ano 07/22/2015, 5:29 PM

## 2015-07-23 ENCOUNTER — Inpatient Hospital Stay (HOSPITAL_COMMUNITY): Payer: Medicare Other

## 2015-07-23 DIAGNOSIS — I5023 Acute on chronic systolic (congestive) heart failure: Secondary | ICD-10-CM

## 2015-07-23 DIAGNOSIS — J9601 Acute respiratory failure with hypoxia: Secondary | ICD-10-CM

## 2015-07-23 DIAGNOSIS — N179 Acute kidney failure, unspecified: Secondary | ICD-10-CM

## 2015-07-23 DIAGNOSIS — I480 Paroxysmal atrial fibrillation: Secondary | ICD-10-CM

## 2015-07-23 LAB — CARBOXYHEMOGLOBIN
CARBOXYHEMOGLOBIN: 2.4 % — AB (ref 0.5–1.5)
Carboxyhemoglobin: 2.6 % — ABNORMAL HIGH (ref 0.5–1.5)
Methemoglobin: 0.9 % (ref 0.0–1.5)
Methemoglobin: 0.7 % (ref 0.0–1.5)
O2 Saturation: 68.4 %
O2 Saturation: 71.6 %
TOTAL HEMOGLOBIN: 10 g/dL — AB (ref 13.5–18.0)
TOTAL HEMOGLOBIN: 8.2 g/dL — AB (ref 13.5–18.0)

## 2015-07-23 LAB — BASIC METABOLIC PANEL
ANION GAP: 14 (ref 5–15)
BUN: 55 mg/dL — AB (ref 6–20)
CALCIUM: 8.9 mg/dL (ref 8.9–10.3)
CO2: 24 mmol/L (ref 22–32)
Chloride: 100 mmol/L — ABNORMAL LOW (ref 101–111)
Creatinine, Ser: 2.42 mg/dL — ABNORMAL HIGH (ref 0.61–1.24)
GFR calc Af Amer: 27 mL/min — ABNORMAL LOW (ref >60)
GFR, EST NON AFRICAN AMERICAN: 23 mL/min — AB (ref >60)
GLUCOSE: 136 mg/dL — AB (ref 65–99)
POTASSIUM: 3.7 mmol/L (ref 3.5–5.1)
Sodium: 138 mmol/L (ref 135–145)

## 2015-07-23 LAB — MRSA PCR SCREENING: MRSA BY PCR: NEGATIVE

## 2015-07-23 MED ORDER — SODIUM CHLORIDE 0.9% FLUSH: 10.0000 mL | INTRAVENOUS | Status: DC | PRN

## 2015-07-23 MED ORDER — METOLAZONE 5 MG PO TABS
5.0000 mg | ORAL_TABLET | Freq: Two times a day (BID) | ORAL | Status: DC
  Administered 2015-07-23 (×2): 5 mg via ORAL
  Filled 2015-07-23 (×2): qty 1

## 2015-07-23 MED ORDER — MILRINONE LACTATE IN DEXTROSE 20-5 MG/100ML-% IV SOLN
0.2500 ug/kg/min | INTRAVENOUS | Status: DC
  Administered 2015-07-23 – 2015-07-24 (×3): 0.25 ug/kg/min via INTRAVENOUS
  Filled 2015-07-23 (×3): qty 100

## 2015-07-23 MED ORDER — AMIODARONE HCL IN DEXTROSE 360-4.14 MG/200ML-% IV SOLN
30.0000 mg/h | INTRAVENOUS | Status: DC
  Administered 2015-07-23: 30 mg/h via INTRAVENOUS
  Administered 2015-07-24 – 2015-07-30 (×20): 60 mg/h via INTRAVENOUS
  Administered 2015-07-31 – 2015-08-05 (×10): 30 mg/h via INTRAVENOUS
  Filled 2015-07-23 (×38): qty 200

## 2015-07-23 MED ORDER — ALBUTEROL SULFATE (2.5 MG/3ML) 0.083% IN NEBU
2.5000 mg | INHALATION_SOLUTION | Freq: Four times a day (QID) | RESPIRATORY_TRACT | Status: DC | PRN
  Filled 2015-07-23: qty 3

## 2015-07-23 MED ORDER — AMIODARONE HCL IN DEXTROSE 360-4.14 MG/200ML-% IV SOLN
60.0000 mg/h | INTRAVENOUS | Status: AC
  Administered 2015-07-23: 60 mg/h via INTRAVENOUS
  Filled 2015-07-23: qty 200

## 2015-07-23 MED ORDER — FUROSEMIDE 10 MG/ML IJ SOLN
20.0000 mg/h | INTRAMUSCULAR | Status: DC
  Administered 2015-07-23 – 2015-07-24 (×2): 20 mg/h via INTRAVENOUS
  Filled 2015-07-23 (×4): qty 25

## 2015-07-23 NOTE — Consult Note (Addendum)
Advanced Heart Failure Team Consult Note    Primary Cardiologist/Referring MD: Dr. Wynonia [name]    HPI:    [name] is an 82 year old male with h/o CAD s/p anterior MI in 1985 followed by CABG and PCI of RCA, chronic systolic HF EF 02%, chronic AF and CKD followed by Dr. Wynonia [name]. We have been asked to consult for possible low output HF.   Over past few weeks has been struggling with progressive volume overload, fatigue and DOE. Saw Dr. Wynonia [name] in the office and was refractory to titration of oral diuretics so was admitted for IV diuresis. Has not responded well to IV lasix. Creatinine  2.2-> 2.4. Toprol cur back due to low output but AF rate went up. Given 2 doses IV digoxin 0.25.  Remains edematous and markedly SOB at rest. No CP. Trying to climb out of bed when I saw him.   Review of Systems: [y] = yes, _0  = no   General: Weight gain Blue.Reese ]; Weight loss _1 ; Anorexia Blue.Reese ]; Fatigue [ y]; Fever _2 ; Chills _3 ; Weakness Blue.Reese ]  Cardiac: Chest pain/pressure _4 ; Resting SOB Blue.Reese ]; Exertional SOB [ y]; Orthopnea Blue.Reese ]; Pedal Edema Blue.Reese ]; Palpitations _5 ; Syncope _6 ; Presyncope _7 ; Paroxysmal nocturnal dyspnea[y ]  Pulmonary: Cough _8 ; Wheezing_9 ; Hemoptysis_10 ; Sputum _11 ; Snoring _12   GI: Vomiting_13 ; Dysphagia_14 ; Melena_15 ; Hematochezia _16 ; Heartburn_17 ; Abdominal pain _18 ; Constipation _19 ; Diarrhea _20 ; BRBPR _21   GU: Hematuria_22 ; Dysuria _23 ; Nocturia_24   Vascular: Pain in legs with walking _25 ; Pain in feet with lying flat _26 ; Non-healing sores _27 ; Stroke _28 ; TIA _29 ; Slurred speech _30 ;  Neuro: Headaches_31 ; Vertigo_32 ; Seizures_33 ; Paresthesias_34 ;Blurred vision _35 ; Diplopia _36 ; Vision changes _37   Ortho/Skin: Arthritis y_38 ; Joint pain Blue.Reese ]; Muscle pain _39 ; Joint swelling _40 ; Back Pain _41 ; Rash _42   Psych: Depression_43 ; Anxiety_44   Heme: Bleeding problems _45 ; Clotting disorders _46 ; Anemia _47   Endocrine: Diabetes _48 ; Thyroid dysfunction_49   Home Medications Prior to  Admission medications   Medication Sig Start Date End Date Taking? Authorizing Provider  apixaban (ELIQUIS) 2.5 MG TABS tablet Take 1 tablet (2.5 mg total) by mouth 2 (two) times daily. Patient taking differently: Take 2.5 mg by mouth 2 (two) times daily.  01/04/13  Yes Joshua Chadwell, PA-C  atorvastatin (LIPITOR) 40 MG tablet Take 40 mg by mouth daily.   Yes Historical Provider, MD  ferrous sulfate 325 (65 FE) MG tablet Take 325 mg by mouth daily with breakfast.   Yes Historical Provider, MD  furosemide (LASIX) 80 MG tablet Take 80 mg by mouth 2 (two) times daily.   Yes Historical Provider, MD  metoprolol (LOPRESSOR) 50 MG tablet Take 50 mg by mouth 2 (two) times daily.     Yes Historical Provider, MD  multivitamin [employer]) per tablet Take 1 tablet by mouth daily.     Yes Historical Provider, MD  potassium chloride SA (K-DUR,KLOR-CON) 20 MEQ tablet Take 20 mEq by mouth daily.     Yes Historical Provider, MD  terazosin (HYTRIN) 5 MG capsule Take 1 capsule (5 mg total) by mouth at bedtime. 01/05/13  Yes Joshua Chadwell, PA-C  valsartan (DIOVAN) 80 MG tablet Take 40 mg by mouth daily. 01/05/13  Yes Chriss Czar, PA-C  vitamin C (ASCORBIC ACID) 500 MG tablet Take 500 mg by mouth daily.     Yes Historical Provider, MD    Past Medical History: Past Medical History  Diagnosis Date  . Gout   . Hypertension   . Old anterior wall myocardial infarction   . Thrombocytopenia, immune   . Atrial fibrillation ([place])   . Hyperlipidemia   . ICD-Medtronic 11/25/2008    09/28/2007  Medtronic Sprint Quattro Secure S model 6935-65 (serial # [id number])                 Medtronic Virtuoso VR model D15VWC (serial # [id number]) single chamber ICD   . CAD (coronary artery disease)     1985 Anterior MI 1985 with LIMA to LAD [place] PTCA to RCA x2 July 2009 Cath EF 20-25%  Patent LIMA to LAD, occluded LAD, Mild to moderate disease in RCA and Circ July 2014 Lexiscan Myoview  EF 32% anterior MI, no  ischemia    . History of breast cancer in male   . Ischemic cardiomyopathy   . Osteoarthritis   . Breast cancer ([place])     dx at age 64  . BRCA2 genetic carrier 2014  . CHF (congestive heart failure) ([place])   . Shortness of breath dyspnea     Past Surgical History: Past Surgical History  Procedure Laterality Date  . Mastectomy, partial      left breast 1991  . Coronary artery bypass graft      LIMA to LAD in June 1985  . Eye surgery    . Joint replacement      bil  . Hip arthroplasty    . Cardiac catheterization      x4  . Total hip arthroplasty Right 12/29/2012    Procedure: TOTAL HIP ARTHROPLASTY WITH AUTOGRAFT;  Surgeon: Yvette Rack., MD;  Location: [facility];  Service: Orthopedics;  Laterality: Right;    Family History: Family History  Problem Relation Age of Onset  . Heart attack Father   . Cancer Mother     dx in her 12s, ended up in her bones  . Cancer Brother     ended up in his bones  . Breast cancer Paternal Aunt     died in her late 23s  . Lung cancer Paternal Uncle   . Cancer Maternal Grandmother     ended up in her bones  . Colon cancer Paternal Grandmother   . Congestive Heart Failure Father     Social History: Social History   Social History  . Marital Status: Married    Spouse Name: N/A  . Number of Children: 3  . Years of Education: N/A   Social History Main Topics  . Smoking status: Former Smoker -- 2.00 packs/day for 10 years    Types: Cigarettes    Quit date: 03/22/1964  . Smokeless tobacco: Never Used  . Alcohol Use: Yes  . Drug Use: No  . Sexual Activity: Not Asked   Other Topics Concern  . None   Social History Narrative    Allergies:  Allergies  Allergen Reactions  . Heparin Other (See Comments)    Unknown reaction.    Objective:    Vital Signs:   Temp:  [98 F (36.7 C)-98.9 F (37.2 C)] 98.8 F (37.1 C) (05/03 0851) Pulse Rate:  [74-104] 95 (05/03 0851) Resp:  [16-18] 18 (05/03 0851) BP: (97-121)/(53-72) 105/54  mmHg (05/03 0851) SpO2:  [87 %-97 %] 94 % (05/03  0086) Weight:  [86.229 kg (190 lb 1.6 oz)] 86.229 kg (190 lb 1.6 oz) (05/03 0414) Last BM Date: 07/20/15 Filed Weights   07/21/15 1719 07/22/15 0701 07/23/15 0414  Weight: 87.68 kg (193 lb 4.8 oz) 87.363 kg (192 lb 9.6 oz) 86.229 kg (190 lb 1.6 oz)    Physical Exam: General:  Elderly male. Trying to climb out of bed. Dyspneic at rest.  HEENT: normal Neck: supple. JVP ear. Carotids 2+ bilat; no bruits. No lymphadenopathy or thryomegaly appreciated. Cor: PMI laterally displaced. IRR iRR +s3. Tachy Lungs: basilar crackles Abdomen: soft, nontender, + distended. No hepatosplenomegaly. No bruits or masses. Good bowel sounds. Extremities: no cyanosis, clubbing, rash, 2-3+ edema Neuro: alert & orientedx3, cranial nerves grossly intact. moves all 4 extremities w/o difficulty. Affect pleasant  Telemetry:  AF  Labs: Basic Metabolic Panel:  Recent Labs Lab 07/21/15 1838 07/22/15 0333 07/23/15 0308  NA 137 136 138  K 5.2* 4.1 3.7  CL 102 102 100*  CO2 _0 GLUCOSE 125* 135* 136*  BUN 51* 55* 55*  CREATININE 2.25* 2.21* 2.42*  CALCIUM 9.1 8.9 8.9    Liver Function Tests:  Recent Labs Lab 07/21/15 1838  AST 33  ALT 25  ALKPHOS 132*  BILITOT 2.0*  PROT 7.7  ALBUMIN 3.5   No results for input(s): LIPASE, AMYLASE in the last 168 hours. No results for input(s): AMMONIA in the last 168 hours.  CBC:  Recent Labs Lab 07/21/15 1838  WBC 9.1  NEUTROABS 7.5  HGB 10.1*  HCT 31.3*  MCV 103.6*  PLT 93*    Cardiac Enzymes: No results for input(s): CKTOTAL, CKMB, CKMBINDEX, TROPONINI in the last 168 hours.  BNP: BNP (last 3 results)  Recent Labs  07/21/15 1838  BNP 1660.5*    ProBNP (last 3 results) No results for input(s): PROBNP in the last 8760 hours.   CBG: No results for input(s): GLUCAP in the last 168 hours.  Coagulation Studies: No results for input(s): LABPROT, INR in the last 72 hours.  Other  results: EKG: AF RBBB anterolateral Qs  Imaging: Dg Chest 2 View  07/22/2015  CLINICAL DATA:  Shortness of breath for 1 day EXAM: CHEST  2 VIEW COMPARISON:  April 29, 2014 FINDINGS: There is a right pleural effusion with right base atelectasis. There may be superimposed pneumonia in the right base as well. On the left, there is no edema or consolidation. There is mild scarring along the left heart border, stable. There is a calcified granuloma in the right mid lung, stable. Heart is borderline enlarged, stable. Pacemaker lead tip is attached to the right ventricle. Patient is status post internal mammary bypass grafting. No adenopathy. No apparent bone lesions. IMPRESSION: Right pleural effusion with right base atelectasis and questionable superimposed pneumonia in the right base. Left lung clear except for scarring at the left heart border. Stable cardiac prominence. No pneumothorax. Electronically Signed   By: Lowella Grip III M.D.   On: 07/22/2015 07:45   US Renal  07/22/2015  CLINICAL DATA:  CHF. Acute on chronic renal failure. History hypertension. EXAM: RENAL / URINARY TRACT ULTRASOUND COMPLETE COMPARISON:  03/18/2006 FINDINGS: Right Kidney: Length: 11.7 cm. There is thin renal parenchyma. No hydronephrosis or focal renal mass. Left Kidney: Length: 12.4 cm. Thin renal parenchyma. No hydronephrosis or focal renal mass. Bladder: Appears normal for degree of bladder distention. Additional: Moderate ascites. Right pleural effusion. Calcifications throughout the spleen. IMPRESSION: 1. Bilateral renal parenchymal thinning.  No hydronephrosis. 2.  Ascites. 3. Right pleural effusion. 4. Splenic granulomata. Electronically Signed   By: Nolon Nations M.D.   On: 07/22/2015 13:00         Assessment:   1. Acute on chronic systolic HF with low output 2. Chronic AF now with RVR 3. Ischemic CM EF 20-25% 4. Acute on chronic renal failure, stage IV 5. CAD s/p anterior MI followed by CABG and PCI  RCA 6. Acute respiratory failure   Plan/Discussion:    He has marked volume overload with low output HF and worsening renal function and significant resting dyspnea. Will place PICC and start IV milrinone. Follow co-ox. May need to transfer to SDU for closer monitoring and BIPAP.   AF rate remains fast. Will continue reduced dose Toprol for now but may need to switch to [facility] for rate control. Continue Eliquis. Will hold Entresto with worsening renal function.   We will follow closely.   Length of Stay: 2   Glori Bickers MD 07/23/2015, 9:42 AM Advanced Heart Failure Team Pager [phone] (M-F; 7a - 4p)  Please contact [facility] for night-coverage after hours (4p -7a ) and weekends on [email or web address]

## 2015-07-23 NOTE — Progress Notes (Signed)
Pt noted with audible wheezing.  Notified A. Lamar, [place].  Will continue to monitor.  Karie Kirks, Therapist, sports.

## 2015-07-23 NOTE — Progress Notes (Signed)
Heart Failure Navigator Consult Note  Presentation: Martin [name] is a 82 year old male is admitted to the hospital for treatment of acute on chronic systolic heart failure. Past history of coronary artery disease with previous anterior infarction will and previous bypass grafting as well as percutaneous intervention to the right coronary artery. He has chronic atrial fibrillation and has been on anticoagulation. He recently had become ill complaining of about 3 weeks and some diarrhea, changes in his hair and then began to develop worsening pedal edema. His furosemide was increased but he developed some increasing creatinine. I saw him in the office about 10 days ago and he gained additional weight was edematous and felt very poor. Colchicine was stopped and he continued on higher dose Lasix. He has improved somewhat but has gained additional weight since he was here. He comes into the office today barely able to walk more than half a block without stopping to rest. He does not have PND orthopnea. He also complained of numbness in his hands as well as arm pain and was having difficulty sleeping at night. His neck veins are elevated today and he clearly has decompensated despite increasing diuretics as an outpatient. In echocardiogram in the office today showed marked left atrial enlargement, ejection fraction of 25% with akinesis of the anteroapical segment and mild pulmonary hypertension. He is admitted to the hospital for treatment of worsening congestive heart failure.  Past Medical History  Diagnosis Date  . Gout   . Hypertension   . Old anterior wall myocardial infarction   . Thrombocytopenia, immune   . Atrial fibrillation ([place])   . Hyperlipidemia   . ICD-Medtronic 11/25/2008    09/28/2007  Medtronic Sprint Quattro Secure S model 6935-65 (serial # [id number])                 Medtronic Virtuoso VR model D15VWC (serial # [id number]) single chamber ICD   . CAD (coronary artery disease)     1985  Anterior MI 1985 with LIMA to LAD [place] PTCA to RCA x2 July 2009 Cath EF 20-25%  Patent LIMA to LAD, occluded LAD, Mild to moderate disease in RCA and Circ July 2014 Lexiscan Myoview  EF 32% anterior MI, no ischemia    . History of breast cancer in male   . Ischemic cardiomyopathy   . Osteoarthritis   . Breast cancer ([place])     dx at age 36  . BRCA2 genetic carrier 2014  . CHF (congestive heart failure) ([place])   . Shortness of breath dyspnea     Social History   Social History  . Marital Status: Married    Spouse Name: N/A  . Number of Children: 3  . Years of Education: N/A   Social History Main Topics  . Smoking status: Former Smoker -- 2.00 packs/day for 10 years    Types: Cigarettes    Quit date: 03/22/1964  . Smokeless tobacco: Never Used  . Alcohol Use: Yes  . Drug Use: No  . Sexual Activity: Not Asked   Other Topics Concern  . None   Social History Narrative    ECHO: new pending  BNP    Component Value Date/Time   BNP 1660.5* 07/21/2015 1838    ProBNP    Component Value Date/Time   PROBNP 12411.0* 12/31/2012 0358     Education Assessment and Provision:  Detailed education and instructions provided on heart failure disease management including the following:  Signs and symptoms of Heart Failure When  to call the physician Importance of daily weights Low sodium diet Fluid restriction Medication management Anticipated future follow-up appointments  Patient education given on each of the above topics.  Patient, wife and son acknowledge understanding and acceptance of all instructions.  I spoke at length with Martin [name] regarding his HF.  He tells me that he has known his heart was "weak".  He does weigh daily and we reviewed the importance of daily weights as well as when to call the physician related to signs and symptoms.  I briefly reviewed a low sodium diet and high sodium foods to avoid.  I also reviewed a 2L fluid restriction.  He denies  any issue with getting or taking prescribed medications.  Of note he is SOB while speaking to me.   He lives with his wife in [place] in an independent living community.  He will follow with AHF Clinic at discharge.   Education Materials:  "Living Better With Heart Failure" Booklet, Daily Weight Tracker Tool   High Risk Criteria for Readmission and/or Poor Patient Outcomes:  (Recommend Follow-up with Advanced Heart Failure Clinic)--yes he would benefit from inpatient HF Consult and outpatient follow-up   EF <30%- pending  2 or more admissions in 6 months- No  Difficult social situation- No  Demonstrates medication noncompliance- Denies    Barriers of Care:  Knowledge and compliance  Discharge Planning:   Plans to return to home with wife to [place].

## 2015-07-23 NOTE — Progress Notes (Signed)
   Poor urine output on high dose lasix and milrinone. Adequate mixed venous saturation. Remains in A fib RVR.   Stop intermittent lasix and start lasix drip at 20 mg per hour + 5 mg metolazone twice a daily. He remains in A fib but not rate controlled. Will add amio drip for rate control.    Will need to move to stepdown for closer monitoring and CVPs.   Dr Haroldine Laws aware and agrees with plan.   Amy Clegg  NP-C   3:20 PM

## 2015-07-23 NOTE — Progress Notes (Signed)
Report  Called to [place], Therapist, sports.  Pt  With order to be transferred.  Karie Kirks, Therapist, sports.

## 2015-07-23 NOTE — Progress Notes (Signed)
Peripherally Inserted Central Catheter/Midline Placement  The IV Nurse has discussed with the patient and/or persons authorized to consent for the patient, the purpose of this procedure and the potential benefits and risks involved with this procedure.  The benefits include less needle sticks, lab draws from the catheter and patient may be discharged home with the catheter.  Risks include, but not limited to, infection, bleeding, blood clot (thrombus formation), and puncture of an artery; nerve damage and irregular heat beat.  Alternatives to this procedure were also discussed.  PICC/Midline Placement Documentation        Martin [name] 07/23/2015, 1:10 PM Consent obtained by Jule Economy, RN

## 2015-07-23 NOTE — Progress Notes (Signed)
Subjective:  Says that he feels better today.  He was sleeping when I walked in the room and still appears to Neck and somewhat tremulous.  Felt hot to me.  Weight down 2 pounds.  Some diuresis since yesterday.  Atrial fibrillation rate is still somewhat increased.  Objective:  Vital Signs in the last 24 hours: BP 117/58 mmHg  Pulse 95  Temp(Src) 98 F (36.7 C) (Oral)  Resp 18  Ht 5\' 7"  (1.702 m)  Wt 86.229 kg (190 lb 1.6 oz)  BMI 29.77 kg/m2  SpO2 96%  Physical Exam: Elderly male in with mild dyspnea at rest  Neck veins elevated at 30 with some V waves Lungs:  Reduced breath sounds at the right base Cardiac:  Irregular rhythm rhythm, normal S1 and S2, no S3 Abdomen:  Soft, nontender, no masses Extremities:  1-2 + edema present  Intake/Output from previous day: 05/02 0701 - 05/03 0700 In: 968 [P.O.:770; IV Piggyback:198] Out: 1850 [Urine:1850] Weight Filed Weights   07/21/15 1719 07/22/15 0701 07/23/15 0414  Weight: 87.68 kg (193 lb 4.8 oz) 87.363 kg (192 lb 9.6 oz) 86.229 kg (190 lb 1.6 oz)    Lab Results: Basic Metabolic Panel:  Recent Labs  07/22/15 0333 07/23/15 0308  NA 136 138  K 4.1 3.7  CL 102 100*  CO2 23 24  GLUCOSE 135* 136*  BUN 55* 55*  CREATININE 2.21* 2.42*    CBC:  Recent Labs  07/21/15 1838  WBC 9.1  NEUTROABS 7.5  HGB 10.1*  HCT 31.3*  MCV 103.6*  PLT 93*    BNP    Component Value Date/Time   BNP 1660.5* 07/21/2015 1838    PROTIME: Lab Results  Component Value Date   INR 1.50* 01/04/2013   INR 1.37 01/02/2013   INR 1.64* 01/01/2013    Telemetry: Atrial fibrillation with rate somewhat increased today  Assessment/Plan:  1.  Acute on chronic systolic heart failure with some diuresis.  Since yesterday and some weight loss-clinically says that he feels better today. 2.  Atrial fibrillation rate somewhat up this morning (was given 2 doses of intravenous digoxin yesterday as metoprolol was reduced because of his  decompensation ( 3.  Stage III chronic kidney disease somewhat worse 4.  Anemia 5.  Ischemic cardio myopathy with previous bypass grafting and PCI of the right coronary artery   6.  Long-term use of anticoagulation  Recommendations:  To get additional Zaroxolyn today.  Creatinine increased somewhat.  Have asked the advanced heart failure team to see patient and assist with management and offer further suggestions.  The patient has a grandson who is graduating on Monday of next week and would like to be out of the hospital by the weekend if possible.  I will be away after today and they will see patient in my absence.      Kerry Hough  MD [facility]  07/23/2015, 8:48 AM

## 2015-07-23 NOTE — Progress Notes (Signed)
Transferred- in from 3east by bed awake and alert, PIV came out from right ac bleeding noted in mod. amount controlled with pressure dressing. Continue to monitor.

## 2015-07-23 NOTE — Progress Notes (Signed)
Notified IV team pt with order to draw carboxhyl and place picc line.  Karie Kirks, Therapist, sports.

## 2015-07-24 DIAGNOSIS — N189 Chronic kidney disease, unspecified: Secondary | ICD-10-CM

## 2015-07-24 DIAGNOSIS — E876 Hypokalemia: Secondary | ICD-10-CM

## 2015-07-24 LAB — URINALYSIS, ROUTINE W REFLEX MICROSCOPIC
GLUCOSE, UA: NEGATIVE mg/dL
Ketones, ur: 15 mg/dL — AB
Nitrite: NEGATIVE
PH: 5 (ref 5.0–8.0)
Protein, ur: 30 mg/dL — AB
SPECIFIC GRAVITY, URINE: 1.017 (ref 1.005–1.030)

## 2015-07-24 LAB — BASIC METABOLIC PANEL
ANION GAP: 15 (ref 5–15)
Anion gap: 14 (ref 5–15)
BUN: 67 mg/dL — AB (ref 6–20)
BUN: 70 mg/dL — ABNORMAL HIGH (ref 6–20)
CALCIUM: 8.3 mg/dL — AB (ref 8.9–10.3)
CALCIUM: 8 mg/dL — AB (ref 8.9–10.3)
CO2: 23 mmol/L (ref 22–32)
CO2: 22 mmol/L (ref 22–32)
CREATININE: 2.91 mg/dL — AB (ref 0.61–1.24)
Chloride: 97 mmol/L — ABNORMAL LOW (ref 101–111)
Chloride: 94 mmol/L — ABNORMAL LOW (ref 101–111)
Creatinine, Ser: 3.05 mg/dL — ABNORMAL HIGH (ref 0.61–1.24)
GFR calc Af Amer: 22 mL/min — ABNORMAL LOW (ref >60)
GFR, EST AFRICAN AMERICAN: 20 mL/min — AB (ref >60)
GFR, EST NON AFRICAN AMERICAN: 19 mL/min — AB (ref >60)
GFR, EST NON AFRICAN AMERICAN: 18 mL/min — AB (ref >60)
GLUCOSE: 338 mg/dL — AB (ref 65–99)
Glucose, Bld: 163 mg/dL — ABNORMAL HIGH (ref 65–99)
POTASSIUM: 3.3 mmol/L — AB (ref 3.5–5.1)
Potassium: 3.5 mmol/L (ref 3.5–5.1)
SODIUM: 134 mmol/L — AB (ref 135–145)
Sodium: 131 mmol/L — ABNORMAL LOW (ref 135–145)

## 2015-07-24 LAB — CBC WITH DIFFERENTIAL/PLATELET
Basophils Absolute: 0 10*3/uL (ref 0.0–0.1)
Basophils Relative: 0 %
EOS PCT: 0 %
Eosinophils Absolute: 0 10*3/uL (ref 0.0–0.7)
HEMATOCRIT: 24.6 % — AB (ref 39.0–52.0)
Hemoglobin: 8.1 g/dL — ABNORMAL LOW (ref 13.0–17.0)
LYMPHS ABS: 0.5 10*3/uL — AB (ref 0.7–4.0)
LYMPHS PCT: 4 %
MCH: 33.3 pg (ref 26.0–34.0)
MCHC: 32.9 g/dL (ref 30.0–36.0)
MCV: 101.2 fL — AB (ref 78.0–100.0)
MONO ABS: 0.7 10*3/uL (ref 0.1–1.0)
Monocytes Relative: 6 %
NEUTROS ABS: 10.5 10*3/uL — AB (ref 1.7–7.7)
Neutrophils Relative %: 90 %
PLATELETS: 75 10*3/uL — AB (ref 150–400)
RBC: 2.43 MIL/uL — ABNORMAL LOW (ref 4.22–5.81)
RDW: 13.2 % (ref 11.5–15.5)
WBC: 11.8 10*3/uL — ABNORMAL HIGH (ref 4.0–10.5)

## 2015-07-24 LAB — PROCALCITONIN: Procalcitonin: 18.4 ng/mL

## 2015-07-24 LAB — URINE MICROSCOPIC-ADD ON

## 2015-07-24 LAB — URIC ACID: Uric Acid, Serum: 11.9 mg/dL — ABNORMAL HIGH (ref 4.4–7.6)

## 2015-07-24 LAB — CARBOXYHEMOGLOBIN
Carboxyhemoglobin: 1.9 % — ABNORMAL HIGH (ref 0.5–1.5)
Methemoglobin: 0.9 % (ref 0.0–1.5)
O2 Saturation: 77.1 %
TOTAL HEMOGLOBIN: 8.1 g/dL — AB (ref 13.5–18.0)

## 2015-07-24 MED ORDER — NOREPINEPHRINE BITARTRATE 1 MG/ML IV SOLN
10.0000 ug/min | INTRAVENOUS | Status: DC
  Administered 2015-07-24 (×2): 10 ug/min via INTRAVENOUS
  Filled 2015-07-24 (×4): qty 4

## 2015-07-24 MED ORDER — TRAMADOL HCL 50 MG PO TABS
50.0000 mg | ORAL_TABLET | Freq: Two times a day (BID) | ORAL | Status: DC | PRN
  Administered 2015-07-24: 50 mg via ORAL
  Filled 2015-07-24: qty 1

## 2015-07-24 MED ORDER — INSULIN ASPART 100 UNIT/ML ~~LOC~~ SOLN
0.0000 [IU] | Freq: Three times a day (TID) | SUBCUTANEOUS | Status: DC
  Administered 2015-07-24: 3 [IU] via SUBCUTANEOUS
  Administered 2015-07-25 (×2): 5 [IU] via SUBCUTANEOUS
  Administered 2015-07-25 – 2015-07-26 (×2): 3 [IU] via SUBCUTANEOUS
  Administered 2015-07-26 – 2015-07-27 (×3): 5 [IU] via SUBCUTANEOUS
  Administered 2015-07-27: 3 [IU] via SUBCUTANEOUS
  Administered 2015-07-27: 5 [IU] via SUBCUTANEOUS
  Administered 2015-07-28: 3 [IU] via SUBCUTANEOUS
  Administered 2015-07-28 (×2): 5 [IU] via SUBCUTANEOUS
  Administered 2015-07-29 (×2): 3 [IU] via SUBCUTANEOUS
  Administered 2015-07-29: 5 [IU] via SUBCUTANEOUS
  Administered 2015-07-30 (×3): 3 [IU] via SUBCUTANEOUS
  Administered 2015-07-31: 2 [IU] via SUBCUTANEOUS
  Administered 2015-07-31: 5 [IU] via SUBCUTANEOUS
  Administered 2015-07-31: 8 [IU] via SUBCUTANEOUS
  Administered 2015-08-01 (×2): 3 [IU] via SUBCUTANEOUS
  Administered 2015-08-02 (×3): 2 [IU] via SUBCUTANEOUS
  Administered 2015-08-03: 8 [IU] via SUBCUTANEOUS
  Administered 2015-08-03: 3 [IU] via SUBCUTANEOUS
  Administered 2015-08-03 – 2015-08-05 (×4): 2 [IU] via SUBCUTANEOUS

## 2015-07-24 MED ORDER — POTASSIUM CHLORIDE CRYS ER 20 MEQ PO TBCR
40.0000 meq | EXTENDED_RELEASE_TABLET | Freq: Once | ORAL | Status: AC
  Administered 2015-07-24: 40 meq via ORAL
  Filled 2015-07-24: qty 2

## 2015-07-24 MED ORDER — DEXTROSE 5 % IV SOLN
1.0000 g | Freq: Two times a day (BID) | INTRAVENOUS | Status: DC
  Administered 2015-07-24 (×2): 1 g via INTRAVENOUS
  Filled 2015-07-24 (×4): qty 1

## 2015-07-24 MED ORDER — VANCOMYCIN HCL 10 G IV SOLR
1250.0000 mg | INTRAVENOUS | Status: DC
  Administered 2015-07-24: 1250 mg via INTRAVENOUS
  Filled 2015-07-24 (×2): qty 1250

## 2015-07-24 MED ORDER — INSULIN ASPART 100 UNIT/ML ~~LOC~~ SOLN
0.0000 [IU] | Freq: Every day | SUBCUTANEOUS | Status: DC
  Administered 2015-07-25: 2 [IU] via SUBCUTANEOUS
  Administered 2015-07-26: 3 [IU] via SUBCUTANEOUS
  Administered 2015-07-27 – 2015-08-02 (×2): 2 [IU] via SUBCUTANEOUS

## 2015-07-24 MED ORDER — VASOPRESSIN 20 UNIT/ML IV SOLN
0.0300 [IU]/min | INTRAVENOUS | Status: DC
  Administered 2015-07-26 – 2015-07-31 (×6): 0.03 [IU]/min via INTRAVENOUS
  Filled 2015-07-24 (×7): qty 2

## 2015-07-24 MED ORDER — PREDNISONE 20 MG PO TABS
40.0000 mg | ORAL_TABLET | Freq: Every day | ORAL | Status: AC
  Administered 2015-07-24 – 2015-07-26 (×3): 40 mg via ORAL
  Filled 2015-07-24 (×3): qty 2

## 2015-07-24 MED ORDER — ATORVASTATIN CALCIUM 40 MG PO TABS
40.0000 mg | ORAL_TABLET | Freq: Every day | ORAL | Status: DC
  Administered 2015-07-24 – 2015-08-04 (×12): 40 mg via ORAL
  Filled 2015-07-24 (×12): qty 1

## 2015-07-24 NOTE — Progress Notes (Signed)
   07/24/15 0739  Vitals  BP (!) 68/46 mmHg  MAP (mmHg) 54  Pulse Rate (!) 101  ECG Heart Rate (!) 103  Resp (!) 24  Oxygen Therapy  SpO2 98 %  Amy/NP at [facility] stated will cont to monitor

## 2015-07-24 NOTE — Progress Notes (Signed)
Pharmacy Antibiotic Note  Martin [name] is a 82 y.o. male admitted on 07/21/2015 with HF exacerbation and was diuresed.  He has dropped his BP, WBC elevated 9>11.8, Tm 99.9, UA has bacteria, low epithelial cells - Ucx IP, PCT 18 Cxr RLL atelectisis.  Pharmacy has been consulted for vancomycin and ceftazidime dosing.  Plan: Vancomycin 1250mg  q24 Ceftazidime 1gm q12  Height: 5\' 7"  (170.2 cm) Weight: 192 lb 9.6 oz (87.363 kg) IBW/kg (Calculated) : 66.1  Temp (24hrs), Avg:98.7 F (37.1 C), Min:97.9 F (36.6 C), Max:99.9 F (37.7 C)   Recent Labs Lab 07/21/15 1838 07/22/15 0333 07/23/15 0308 07/24/15 0400 07/24/15 1129  WBC 9.1  --   --   --  11.8*  CREATININE 2.25* 2.21* 2.42* 2.91*  --     Estimated Creatinine Clearance: 20.7 mL/min (by C-G formula based on Cr of 2.91).    Allergies  Allergen Reactions  . Heparin Other (See Comments)    Unknown reaction.    Antimicrobials this admission: 5/4 vanc>> 5/4 ceftaz>>  Dose adjustments this admission:   Microbiology results: 5/4 Ucx >> IP 5/4 Bcx >> IP   Bonnita Nasuti Pharm.D. CPP, BCPS Clinical Pharmacist [phone] 07/24/2015 1:57 PM

## 2015-07-24 NOTE — Progress Notes (Signed)
Hypotensive earlier today and started on norepi drip.  Blood cultures obtained.  Procalcitonin 18.40  UA---> many bacteria. Hold for urine culture.   Start fortaz and vancomycin.   Amy Clegg NP-C  1:46 PM   Agree.  Jerrol Helmers,MD 11:48 AM

## 2015-07-24 NOTE — Progress Notes (Addendum)
Advanced Heart Failure Rounding Note   Subjective:    Martin [name] is an 82 year old male with h/o CAD s/p anterior MI in 1985 followed by CABG and PCI of RCA, chronic systolic HF EF 123456, chronic AF and CKD followed by Dr. Wynonia Lawman. We have been asked to consult for possible low output HF.   Over past few weeks has been struggling with progressive volume overload, fatigue and DOE. Saw Dr. Wynonia Lawman in the office and was refractory to titration of oral diuretics so was admitted for IV diuresis. Did not respond well to IV lasix. Creatinine 2.2-> 2.4.  Moved to CCU 5/3 due to worsening resp status and poor diuresis. Started on milrinone and lasix gtt.  Co-ox 77.1 CVP now 8-9 Creatinine continues to climb now 2.9. SBP 60-70s. Urine extremely concentrated.   On amio for AF rate control  Objective:   Weight Range:  Vital Signs:   Temp:  [97.8 F (36.6 C)-98.9 F (37.2 C)] 98 F (36.7 C) (05/04 0400) Pulse Rate:  [89-103] 103 (05/04 0400) Resp:  [18-28] 21 (05/04 0400) BP: (84-128)/(37-66) 85/48 mmHg (05/04 0400) SpO2:  [94 %-100 %] 98 % (05/04 0400) Weight:  [87.363 kg (192 lb 9.6 oz)] 87.363 kg (192 lb 9.6 oz) (05/04 0407) Last BM Date: 07/23/15  Weight change: Filed Weights   07/22/15 0701 07/23/15 0414 07/24/15 0407  Weight: 87.363 kg (192 lb 9.6 oz) 86.229 kg (190 lb 1.6 oz) 87.363 kg (192 lb 9.6 oz)    Intake/Output:   Intake/Output Summary (Last 24 hours) at 07/24/15 0734 Last data filed at 07/24/15 0500  Gross per 24 hour  Intake 1044.77 ml  Output   1200 ml  Net -155.23 ml     Physical Exam: General: Elderly male. Lying in bed NAD HEENT: normal Neck: supple. JVP 7-8  Carotids 2+ bilat; no bruits. No lymphadenopathy or thryomegaly appreciated. Cor: PMI laterally displaced. IRR iRR Tachy Lungs: basilar crackles Abdomen: soft, nontender, NT. No hepatosplenomegaly. No bruits or masses. Good bowel sounds. Extremities: no cyanosis, clubbing, rash, trace-1+ edema Neuro:  alert & orientedx3, cranial nerves grossly intact. moves all 4 extremities w/o difficulty. Affect pleasant  Telemetry: AF 100-110  Labs: Basic Metabolic Panel:  Recent Labs Lab 07/21/15 1838 07/22/15 0333 07/23/15 0308 07/24/15 0400  NA 137 136 138 134*  K 5.2* 4.1 3.7 3.3*  CL 102 102 100* 97*  CO2 22 23 24 23   GLUCOSE 125* 135* 136* 163*  BUN 51* 55* 55* 67*  CREATININE 2.25* 2.21* 2.42* 2.91*  CALCIUM 9.1 8.9 8.9 8.3*    Liver Function Tests:  Recent Labs Lab 07/21/15 1838  AST 33  ALT 25  ALKPHOS 132*  BILITOT 2.0*  PROT 7.7  ALBUMIN 3.5   No results for input(s): LIPASE, AMYLASE in the last 168 hours. No results for input(s): AMMONIA in the last 168 hours.  CBC:  Recent Labs Lab 07/21/15 1838  WBC 9.1  NEUTROABS 7.5  HGB 10.1*  HCT 31.3*  MCV 103.6*  PLT 93*    Cardiac Enzymes: No results for input(s): CKTOTAL, CKMB, CKMBINDEX, TROPONINI in the last 168 hours.  BNP: BNP (last 3 results)  Recent Labs  07/21/15 1838  BNP 1660.5*    ProBNP (last 3 results) No results for input(s): PROBNP in the last 8760 hours.    Other results:  Imaging: US Renal  07/22/2015  CLINICAL DATA:  CHF. Acute on chronic renal failure. History hypertension. EXAM: RENAL / URINARY TRACT ULTRASOUND COMPLETE COMPARISON:  03/18/2006 FINDINGS: Right Kidney: Length: 11.7 cm. There is thin renal parenchyma. No hydronephrosis or focal renal mass. Left Kidney: Length: 12.4 cm. Thin renal parenchyma. No hydronephrosis or focal renal mass. Bladder: Appears normal for degree of bladder distention. Additional: Moderate ascites. Right pleural effusion. Calcifications throughout the spleen. IMPRESSION: 1. Bilateral renal parenchymal thinning.  No hydronephrosis. 2. Ascites. 3. Right pleural effusion. 4. Splenic granulomata. Electronically Signed   By: Nolon Nations M.D.   On: 07/22/2015 13:00   Dg Chest Port 1 View  07/23/2015  CLINICAL DATA:  Bedside PICC placement. Current  history of acute on chronic systolic congestive heart failure. EXAM: PORTABLE CHEST 1 VIEW COMPARISON:  07/22/2015 and earlier. FINDINGS: Right arm PICC tip projects over the lower SVC. Left subclavian single lead transvenous pacemaker unchanged. Prior sternotomy. Cardiac silhouette moderately enlarged, unchanged, allowing differences in technique. Moderate size right pleural effusion and associated mild passive atelectasis in the right lower lobe, unchanged. Pulmonary venous hypertension without overt edema currently. No new pulmonary parenchymal abnormality. IMPRESSION: 1. Right arm PICC tip projects over the lower SVC. 2. Stable right pleural effusion and associated passive atelectasis in the right lower lobe. 3. Pulmonary venous hypertension without overt edema currently. 4. No new abnormalities. Electronically Signed   By: Evangeline Dakin M.D.   On: 07/23/2015 14:07      Medications:     Scheduled Medications: . apixaban  2.5 mg Oral BID  . ferrous sulfate  325 mg Oral Q breakfast  . metolazone  5 mg Oral BID  . sodium chloride flush  3 mL Intravenous Q12H  . terazosin  5 mg Oral QHS     Infusions: . amiodarone 30 mg/hr (07/23/15 2247)  . furosemide (LASIX) infusion 20 mg/hr (07/24/15 0601)  . milrinone 0.25 mcg/kg/min (07/24/15 0601)     PRN Medications:  sodium chloride, acetaminophen, albuterol, ondansetron (ZOFRAN) IV, sodium chloride flush, sodium chloride flush   Assessment:   1. Acute on chronic systolic HF with low output 2. Chronic AF now with RVR 3. Ischemic CM EF 20-25% 4. Acute on chronic renal failure, stage IV 5. CAD s/p anterior MI followed by CABG and PCI RCA 6. Acute respiratory failure 7. Hypotension 8. Hypokalemia  Plan/Discussion:    He is hypotensive with worsening renal function. Volume status seems to be much better despite limited diuresis. Co-ox ok. Will stop milrinone. Start norepi for BP support. Stop lasix and metolazone. Will u/s kidneys. I  am very worried that his renal failure will continue to worsen. Supp K gently.   Possibility of sepsis. Will check BCX and CBC. Low threshold for abx.  Check PCT.    Length of Stay: 3   Glori Bickers MD 07/24/2015, 7:34 AM  Advanced Heart Failure Team Pager [phone] (M-F; [place])  Please contact [facility] for night-coverage after hours (4p -7a ) and weekends on [email or web address]

## 2015-07-25 ENCOUNTER — Inpatient Hospital Stay (HOSPITAL_COMMUNITY): Payer: Medicare Other

## 2015-07-25 DIAGNOSIS — R6520 Severe sepsis without septic shock: Secondary | ICD-10-CM

## 2015-07-25 DIAGNOSIS — I509 Heart failure, unspecified: Secondary | ICD-10-CM

## 2015-07-25 DIAGNOSIS — A419 Sepsis, unspecified organism: Secondary | ICD-10-CM

## 2015-07-25 LAB — CBC WITH DIFFERENTIAL/PLATELET
BASOS PCT: 0 %
Basophils Absolute: 0 10*3/uL (ref 0.0–0.1)
EOS ABS: 0 10*3/uL (ref 0.0–0.7)
Eosinophils Relative: 0 %
HEMATOCRIT: 27 % — AB (ref 39.0–52.0)
HEMOGLOBIN: 8.8 g/dL — AB (ref 13.0–17.0)
LYMPHS ABS: 0.2 10*3/uL — AB (ref 0.7–4.0)
Lymphocytes Relative: 2 %
MCH: 33.2 pg (ref 26.0–34.0)
MCHC: 32.6 g/dL (ref 30.0–36.0)
MCV: 101.9 fL — ABNORMAL HIGH (ref 78.0–100.0)
MONO ABS: 0.7 10*3/uL (ref 0.1–1.0)
MONOS PCT: 4 %
NEUTROS PCT: 94 %
Neutro Abs: 14.1 10*3/uL — ABNORMAL HIGH (ref 1.7–7.7)
Platelets: 97 10*3/uL — ABNORMAL LOW (ref 150–400)
RBC: 2.65 MIL/uL — ABNORMAL LOW (ref 4.22–5.81)
RDW: 13.4 % (ref 11.5–15.5)
WBC: 15 10*3/uL — ABNORMAL HIGH (ref 4.0–10.5)

## 2015-07-25 LAB — BASIC METABOLIC PANEL
Anion gap: 14 (ref 5–15)
BUN: 80 mg/dL — AB (ref 6–20)
CALCIUM: 8.1 mg/dL — AB (ref 8.9–10.3)
CO2: 22 mmol/L (ref 22–32)
Chloride: 92 mmol/L — ABNORMAL LOW (ref 101–111)
Creatinine, Ser: 3.69 mg/dL — ABNORMAL HIGH (ref 0.61–1.24)
GFR calc Af Amer: 16 mL/min — ABNORMAL LOW (ref >60)
GFR calc non Af Amer: 14 mL/min — ABNORMAL LOW (ref >60)
GLUCOSE: 313 mg/dL — AB (ref 65–99)
Potassium: 4.1 mmol/L (ref 3.5–5.1)
Sodium: 128 mmol/L — ABNORMAL LOW (ref 135–145)

## 2015-07-25 LAB — PROCALCITONIN: Procalcitonin: 27.37 ng/mL

## 2015-07-25 LAB — URINE CULTURE

## 2015-07-25 LAB — CARBOXYHEMOGLOBIN
CARBOXYHEMOGLOBIN: 1 % (ref 0.5–1.5)
METHEMOGLOBIN: 0.8 % (ref 0.0–1.5)
O2 Saturation: 81.4 %
TOTAL HEMOGLOBIN: 10.7 g/dL — AB (ref 13.5–18.0)

## 2015-07-25 LAB — GLUCOSE, CAPILLARY
GLUCOSE-CAPILLARY: 182 mg/dL — AB (ref 65–99)
GLUCOSE-CAPILLARY: 187 mg/dL — AB (ref 65–99)
GLUCOSE-CAPILLARY: 231 mg/dL — AB (ref 65–99)
GLUCOSE-CAPILLARY: 200 mg/dL — AB (ref 65–99)
Glucose-Capillary: 206 mg/dL — ABNORMAL HIGH (ref 65–99)
Glucose-Capillary: 237 mg/dL — ABNORMAL HIGH (ref 65–99)

## 2015-07-25 LAB — ECHOCARDIOGRAM COMPLETE
Height: 67 in
WEIGHTICAEL: 3238.12 [oz_av]

## 2015-07-25 MED ORDER — TRAMADOL HCL 50 MG PO TABS
50.0000 mg | ORAL_TABLET | Freq: Three times a day (TID) | ORAL | Status: DC | PRN
  Administered 2015-07-25 – 2015-08-04 (×13): 50 mg via ORAL
  Filled 2015-07-25 (×14): qty 1

## 2015-07-25 MED ORDER — LEVALBUTEROL HCL 0.63 MG/3ML IN NEBU: 0.6300 mg | INHALATION_SOLUTION | Freq: Four times a day (QID) | RESPIRATORY_TRACT | Status: DC | PRN

## 2015-07-25 MED ORDER — VANCOMYCIN HCL IN DEXTROSE 1-5 GM/200ML-% IV SOLN
1000.0000 mg | INTRAVENOUS | Status: DC
  Filled 2015-07-25: qty 200

## 2015-07-25 MED ORDER — SODIUM CHLORIDE 0.9% FLUSH
10.0000 mL | Freq: Two times a day (BID) | INTRAVENOUS | Status: DC
  Administered 2015-07-25 – 2015-08-05 (×14): 10 mL

## 2015-07-25 MED ORDER — GUAIFENESIN 100 MG/5ML PO SOLN
5.0000 mL | ORAL | Status: DC | PRN
  Administered 2015-07-25 – 2015-07-29 (×3): 100 mg via ORAL
  Filled 2015-07-25 (×3): qty 5

## 2015-07-25 MED ORDER — FUROSEMIDE 10 MG/ML IJ SOLN
160.0000 mg | Freq: Four times a day (QID) | INTRAVENOUS | Status: DC
  Administered 2015-07-25 – 2015-07-26 (×4): 160 mg via INTRAVENOUS
  Filled 2015-07-25 (×5): qty 16

## 2015-07-25 MED ORDER — PIPERACILLIN-TAZOBACTAM IN DEX 2-0.25 GM/50ML IV SOLN
2.2500 g | Freq: Three times a day (TID) | INTRAVENOUS | Status: DC
  Administered 2015-07-25 – 2015-07-26 (×4): 2.25 g via INTRAVENOUS
  Filled 2015-07-25 (×5): qty 50

## 2015-07-25 MED ORDER — SODIUM CHLORIDE 0.9% FLUSH: 10.0000 mL | INTRAVENOUS | Status: DC | PRN

## 2015-07-25 MED ORDER — NOREPINEPHRINE BITARTRATE 1 MG/ML IV SOLN
0.0000 ug/min | INTRAVENOUS | Status: DC
  Administered 2015-07-25: 15 ug/min via INTRAVENOUS
  Administered 2015-07-25: 23 ug/min via INTRAVENOUS
  Administered 2015-07-26: 37 ug/min via INTRAVENOUS
  Administered 2015-07-26: 38 ug/min via INTRAVENOUS
  Administered 2015-07-26: 35 ug/min via INTRAVENOUS
  Administered 2015-07-27: 16 ug/min via INTRAVENOUS
  Administered 2015-07-27: 30 ug/min via INTRAVENOUS
  Administered 2015-07-28: 14 ug/min via INTRAVENOUS
  Administered 2015-07-29: 12 ug/min via INTRAVENOUS
  Administered 2015-07-30: 6 ug/min via INTRAVENOUS
  Administered 2015-08-01: 8 ug/min via INTRAVENOUS
  Administered 2015-08-03: 3.8 ug/min via INTRAVENOUS
  Administered 2015-08-05: 7 ug/min via INTRAVENOUS
  Filled 2015-07-25 (×15): qty 16

## 2015-07-25 MED ORDER — DEXTROSE 5 % IV SOLN: 2.0000 g | INTRAVENOUS | Status: DC

## 2015-07-25 NOTE — Progress Notes (Signed)
Inpatient Diabetes Program Recommendations  AACE/ADA: New Consensus Statement on Inpatient Glycemic Control (2015)  Target Ranges:  Prepandial:   less than 140 mg/dL      Peak postprandial:   less than 180 mg/dL (1-2 hours)      Critically ill patients:  140 - 180 mg/dL  Results for Martin [name], [place] (MRN [id number]) as of 07/25/2015 11:31  Ref. Range 07/24/2015 18:08 07/24/2015 22:00 07/25/2015 08:00  Glucose-Capillary Latest Ref Range: 65-99 mg/dL 182 (H) 187 (H) 231 (H)   Review of Glycemic Control  Diabetes history: No Outpatient Diabetes medications: NA Current orders for Inpatient glycemic control: Novolog 0-15 units TID with meals, Novolog 0-5 units QHS  Inpatient Diabetes Program Recommendations: HgbA1C: Patient does not have a history of diabetes. Ordered steroids which are likely cause of hyperglycemia. Please consider ordering an A1C to evaluate glycemic control over the past 2-3 months.  Thanks, Barnie Alderman, RN, MSN, CDE Diabetes Coordinator Inpatient Diabetes Program [phone] (Team Pager from [place] to [place]) [phone] ([facility] office) [phone] [facility] office) [phone] [facility] office)

## 2015-07-25 NOTE — Progress Notes (Signed)
  Echocardiogram 2D Echocardiogram has been performed.  Martin [name] 07/25/2015, 4:29 PM

## 2015-07-25 NOTE — Progress Notes (Signed)
Pharmacy Antibiotic Note  Martin [name] is a 82 y.o. male started on vancomycin and ceftazidime yesterday for possible sepsis 2/2 UTI vs HCAP. PCT has increased from 18.4 to 27.37 and WBC is trending up 11.8 to 15. Ceftazidime will be changed to zosyn to cover anaerobes. Renal function is also worsening sCr 2.2 > 2.4 > 2.9 > 3.05 > 3.69 - nephrology consulted. Vancomycin dose will be adjusted.  Plan: 1) Change vancomycin to 1g IV q48 2) Change to zosyn 2.25g IV q8 3) Continue to follow renal function, cultures  Height: 5\' 7"  (170.2 cm) Weight: 202 lb 6.1 oz (91.8 kg) IBW/kg (Calculated) : 66.1  Temp (24hrs), Avg:98.5 F (36.9 C), Min:98.2 F (36.8 C), Max:99 F (37.2 C)   Recent Labs Lab 07/21/15 1838 07/22/15 0333 07/23/15 0308 07/24/15 0400 07/24/15 1129 07/24/15 1324 07/25/15 0410  WBC 9.1  --   --   --  11.8*  --  15.0*  CREATININE 2.25* 2.21* 2.42* 2.91*  --  3.05* 3.69*    Estimated Creatinine Clearance: 16.7 mL/min (by C-G formula based on Cr of 3.69).    Allergies  Allergen Reactions  . Heparin Other (See Comments)    Unknown reaction.    Antimicrobials this admission: 5/4 Vancomycin >> 5/4 Ceftazidime >> 5/5 5/5 Zosyn >>  Dose adjustments this admission: n/a  Microbiology results: 5/4 UA >> many bacteria, WBC too numerous, small leukocytes 5/4 urine>> 5/4 blood>>   Thank you for allowing pharmacy to be a part of this patient's care.  Deboraha Sprang 07/25/2015 11:42 AM

## 2015-07-25 NOTE — Progress Notes (Signed)
Advanced Heart Failure Rounding Note   Subjective:    Mr. Caffrey is an 82 year old male with h/o CAD s/p anterior MI in 1985 followed by CABG and PCI of RCA, chronic systolic HF EF 123456, chronic AF and CKD followed by Dr. Wynonia Lawman. We have been asked to consult for possible low output HF.   Over past few weeks has been struggling with progressive volume overload, fatigue and DOE. Saw Dr. Wynonia Lawman in the office and was refractory to titration of oral diuretics so was admitted for IV diuresis. Did not respond well to IV lasix. Creatinine 2.2-> 2.4.  Moved to ICU due to hypotension. Urine with many bacteria. Milrinone stopped norepi started. Diuretics stopped.Minimal urine output. On amio for AF rate control. Pro calcitonin trending up 18>27. Uric Acid 11.9.  Denies SOB. Increased cough.   UA- many bacteria. Culture pending  Blood Cultures- NGTD   Objective:   Weight Range:  Vital Signs:   Temp:  [97.9 F (36.6 C)-99.9 F (37.7 C)] 98.2 F (36.8 C) (05/05 0400) Pulse Rate:  [95-137] 105 (05/04 1730) Resp:  [18-37] 18 (05/05 0600) BP: (68-116)/(37-66) 93/55 mmHg (05/05 0600) SpO2:  [93 %-100 %] 96 % (05/05 0600) Weight:  [202 lb 6.1 oz (91.8 kg)] 202 lb 6.1 oz (91.8 kg) (05/05 0500) Last BM Date: 07/24/15  Weight change: Filed Weights   07/23/15 0414 07/24/15 0407 07/25/15 0500  Weight: 190 lb 1.6 oz (86.229 kg) 192 lb 9.6 oz (87.363 kg) 202 lb 6.1 oz (91.8 kg)    Intake/Output:   Intake/Output Summary (Last 24 hours) at 07/25/15 [id number] Last data filed at 07/25/15 0700  Gross per 24 hour  Intake 2342.63 ml  Output    266 ml  Net 2076.63 ml     Physical Exam: CVP 7-8  General: Elderly male. Lying in bed NAD HEENT: normal Neck: supple. JVP 7-8  Carotids 2+ bilat; no bruits. No lymphadenopathy or thryomegaly appreciated. Cor: PMI laterally displaced. IRR Tachy Lungs: EW. Decreased in the bases. On 2 liters [place] Abdomen: soft, nontender, NT. No hepatosplenomegaly. No  bruits or masses. Good bowel sounds. Extremities: no cyanosis, clubbing, rash, trace 1+ edema Neuro: alert & orientedx3, cranial nerves grossly intact. moves all 4 extremities w/o difficulty. Affect pleasant  Telemetry: AF 110-119  Labs: Basic Metabolic Panel:  Recent Labs Lab 07/22/15 0333 07/23/15 0308 07/24/15 0400 07/24/15 1324 07/25/15 0410  NA 136 138 134* 131* 128*  K 4.1 3.7 3.3* 3.5 4.1  CL 102 100* 97* 94* 92*  CO2 23 24 23 22 22   GLUCOSE 135* 136* 163* 338* 313*  BUN 55* 55* 67* 70* 80*  CREATININE 2.21* 2.42* 2.91* 3.05* 3.69*  CALCIUM 8.9 8.9 8.3* 8.0* 8.1*    Liver Function Tests:  Recent Labs Lab 07/21/15 1838  AST 33  ALT 25  ALKPHOS 132*  BILITOT 2.0*  PROT 7.7  ALBUMIN 3.5   No results for input(s): LIPASE, AMYLASE in the last 168 hours. No results for input(s): AMMONIA in the last 168 hours.  CBC:  Recent Labs Lab 07/21/15 1838 07/24/15 1129  WBC 9.1 11.8*  NEUTROABS 7.5 10.5*  HGB 10.1* 8.1*  HCT 31.3* 24.6*  MCV 103.6* 101.2*  PLT 93* 75*    Cardiac Enzymes: No results for input(s): CKTOTAL, CKMB, CKMBINDEX, TROPONINI in the last 168 hours.  BNP: BNP (last 3 results)  Recent Labs  07/21/15 1838  BNP 1660.5*    ProBNP (last 3 results) No results for input(s): PROBNP in  the last 8760 hours.    Other results:  Imaging: Dg Chest Port 1 View  07/23/2015  CLINICAL DATA:  Bedside PICC placement. Current history of acute on chronic systolic congestive heart failure. EXAM: PORTABLE CHEST 1 VIEW COMPARISON:  07/22/2015 and earlier. FINDINGS: Right arm PICC tip projects over the lower SVC. Left subclavian single lead transvenous pacemaker unchanged. Prior sternotomy. Cardiac silhouette moderately enlarged, unchanged, allowing differences in technique. Moderate size right pleural effusion and associated mild passive atelectasis in the right lower lobe, unchanged. Pulmonary venous hypertension without overt edema currently. No new  pulmonary parenchymal abnormality. IMPRESSION: 1. Right arm PICC tip projects over the lower SVC. 2. Stable right pleural effusion and associated passive atelectasis in the right lower lobe. 3. Pulmonary venous hypertension without overt edema currently. 4. No new abnormalities. Electronically Signed   By: Evangeline Dakin M.D.   On: 07/23/2015 14:07     Medications:     Scheduled Medications: . apixaban  2.5 mg Oral BID  . atorvastatin  40 mg Oral q1800  . cefTAZidime (FORTAZ)  IV  1 g Intravenous Q12H  . ferrous sulfate  325 mg Oral Q breakfast  . insulin aspart  0-15 Units Subcutaneous TID WC  . insulin aspart  0-5 Units Subcutaneous QHS  . predniSONE  40 mg Oral Q breakfast  . sodium chloride flush  10-40 mL Intracatheter Q12H  . sodium chloride flush  3 mL Intravenous Q12H  . vancomycin  1,250 mg Intravenous Q24H    Infusions: . amiodarone 60 mg/hr (07/24/15 2204)  . norepinephrine (LEVOPHED) Adult infusion 15 mcg/min (07/25/15 0300)  . vasopressin (PITRESSIN) infusion - *FOR SHOCK*      PRN Medications: sodium chloride, acetaminophen, albuterol, ondansetron (ZOFRAN) IV, sodium chloride flush, sodium chloride flush, sodium chloride flush, traMADol   Assessment:   1.Septic --Urine  2. Acute on chronic systolic HF  2. Chronic AF now with RVR 3. Ischemic CM EF 20-25% 4. Acute on chronic renal failure, stage IV 5. CAD s/p anterior MI followed by CABG and PCI RCA 6. Acute respiratory failure 7. Hypotension 8. Hypokalemia 9. Acute Gout Flare  10. Cought  Plan/Discussion:   Hypotensive- Urosepsis.- Urine culture - pending. Blood cultures pending.  CO-OX ok but may be falsely elevated with sepsis. Continue norepi and can add vasopressin if needed. On broad spectrum antibiotics. Continue Vanc. Stop Ceftaz and start zosyn as procalcitonin trending up.CXR now. WBC pending. Add Xopenex.   Worsening renal function with minimal urine output. Considered dopamine but concern about  arrhthymias. Per Dr Haroldine Laws consult nephrology for ATN for short term CVVH.  Uric acid elevated. No colchicine with worsening renal funciton. Day 2/3 prednisone burst.     Discussed with him and his wife. They agree to short term dialysis if needed.      Length of Stay: 4   Amy Clegg NP-C  07/25/2015, 7:12 AM  Advanced Heart Failure Team Pager [phone] (M-F; 7a - 4p)  Please contact [facility] for night-coverage after hours (4p -7a ) and weekends on [email or web address]  Patient seen and examined with Darrick Grinder, NP. We discussed all aspects of the encounter. I agree with the assessment and plan as stated above.   Has severe sepsis with worsening renal failure.  Moved to ICU. BP support with norepi. Now on vancomycin and zosyn. Awaiting cultures. Remains on amio for rate control of chronic AF.   Will consult Renal. He is ok with CVVHD if needed but with LV dysfunction would not  be candidate fr long-term HD.   The patient is critically ill with multiple organ systems failure and requires high complexity decision making for assessment and support, frequent evaluation and titration of therapies, application of advanced monitoring technologies and extensive interpretation of multiple databases.   Critical Care Time devoted to patient care services described in this note is 35 Minutes.  Gene Glazebrook,MD 11:51 AM

## 2015-07-25 NOTE — Consult Note (Signed)
[facility] Renal Consultation Note  Requesting MD: Wynonia Lawman Indication for Consultation: Worsening renal function in setting of sepsis  HPI:  Martin [name] is a 82 y.o. male with h/o CAD s/p anterior MI in 1985 followed by CABG and PCI of RCA, chronic systolic HF EF 26%, chronic AF and CKD admitted with acute on chronic heart failure for IV diuresis with inadequate response to IV lasix. There is also concern for  UTI sepsis, currently being treated with Vanco/zosyn. Urine Cx shows small bacteria, blood cultures pending. In setting of septic picture the patient has had hypotension with pressor requirement and worsening renal function with fall in GFR and urine output.  At this time the patient denies shortness of breath, chest pain.   CREATININE  Date/Time Value Ref Range Status  01/06/2015 03:24 PM 1.8* 0.7 - 1.3 mg/dL Final  01/08/2014 02:58 PM 1.7* 0.7 - 1.3 mg/dL Final   CREATININE, SER  Date/Time Value Ref Range Status  07/25/2015 04:10 AM 3.69* 0.61 - 1.24 mg/dL Final  07/24/2015 01:24 PM 3.05* 0.61 - 1.24 mg/dL Final  07/24/2015 04:00 AM 2.91* 0.61 - 1.24 mg/dL Final  07/23/2015 03:08 AM 2.42* 0.61 - 1.24 mg/dL Final  07/22/2015 03:33 AM 2.21* 0.61 - 1.24 mg/dL Final  07/21/2015 06:38 PM 2.25* 0.61 - 1.24 mg/dL Final  01/05/2013 09:12 AM 1.41* 0.50 - 1.35 mg/dL Final  01/04/2013 10:35 AM 1.49* 0.50 - 1.35 mg/dL Final  01/03/2013 05:45 AM 1.71* 0.50 - 1.35 mg/dL Final  01/02/2013 04:25 AM 1.94* 0.50 - 1.35 mg/dL Final  01/01/2013 04:00 AM 2.00* 0.50 - 1.35 mg/dL Final  12/31/2012 03:58 AM 2.03* 0.50 - 1.35 mg/dL Final  12/30/2012 03:00 AM 1.76* 0.50 - 1.35 mg/dL Final  12/29/2012 03:00 PM 1.62* 0.50 - 1.35 mg/dL Final  12/21/2012 11:24 AM 1.57* 0.50 - 1.35 mg/dL Final  03/31/2011 09:46 AM 1.69* 0.50 - 1.35 mg/dL Final  11/15/2007 08:35 AM 1.7* 0.4-1.5 mg/dL Final  07/20/2007 06:50 AM 1.76*  Final  06/01/2007 09:16 AM 1.51*  Final  04/04/2007 03:40 PM 1.46   Final     PMHx:   Past Medical History  Diagnosis Date  . Gout   . Hypertension   . Old anterior wall myocardial infarction   . Thrombocytopenia, immune   . Atrial fibrillation ([place])   . Hyperlipidemia   . ICD-Medtronic 11/25/2008    09/28/2007  Medtronic Sprint Quattro Secure S model 6935-65 (serial # [id number])                 Medtronic Virtuoso VR model D15VWC (serial # [id number]) single chamber ICD   . CAD (coronary artery disease)     1985 Anterior MI 1985 with LIMA to LAD [place] PTCA to RCA x2 July 2009 Cath EF 20-25%  Patent LIMA to LAD, occluded LAD, Mild to moderate disease in RCA and Circ July 2014 Lexiscan Myoview  EF 32% anterior MI, no ischemia    . History of breast cancer in male   . Ischemic cardiomyopathy   . Osteoarthritis   . Breast cancer ([place])     dx at age 64  . BRCA2 genetic carrier 2014  . CHF (congestive heart failure) ([place])   . Shortness of breath dyspnea     Past Surgical History  Procedure Laterality Date  . Mastectomy, partial      left breast 1991  . Coronary artery bypass graft      LIMA to LAD in June 1985  . Eye  surgery    . Joint replacement      bil  . Hip arthroplasty    . Cardiac catheterization      x4  . Total hip arthroplasty Right 12/29/2012    Procedure: TOTAL HIP ARTHROPLASTY WITH AUTOGRAFT;  Surgeon: Yvette Rack., MD;  Location: [facility];  Service: Orthopedics;  Laterality: Right;    Family Hx:  Family History  Problem Relation Age of Onset  . Heart attack Father   . Cancer Mother     dx in her 37s, ended up in her bones  . Cancer Brother     ended up in his bones  . Breast cancer Paternal Aunt     died in her late 37s  . Lung cancer Paternal Uncle   . Cancer Maternal Grandmother     ended up in her bones  . Colon cancer Paternal Grandmother   . Congestive Heart Failure Father     Social History:  reports that he quit smoking about 51 years ago. His smoking use included Cigarettes. He has a 20  pack-year smoking history. He has never used smokeless tobacco. He reports that he drinks alcohol. He reports that he does not use illicit drugs.  Allergies:  Allergies  Allergen Reactions  . Heparin Other (See Comments)    Unknown reaction.    Medications: Prior to Admission medications   Medication Sig Start Date End Date Taking? Authorizing Provider  apixaban (ELIQUIS) 2.5 MG TABS tablet Take 1 tablet (2.5 mg total) by mouth 2 (two) times daily. Patient taking differently: Take 2.5 mg by mouth 2 (two) times daily.  01/04/13  Yes Joshua Chadwell, PA-C  atorvastatin (LIPITOR) 40 MG tablet Take 40 mg by mouth daily.   Yes Historical Provider, MD  ferrous sulfate 325 (65 FE) MG tablet Take 325 mg by mouth daily with breakfast.   Yes Historical Provider, MD  furosemide (LASIX) 80 MG tablet Take 80 mg by mouth 2 (two) times daily.   Yes Historical Provider, MD  metoprolol (LOPRESSOR) 50 MG tablet Take 50 mg by mouth 2 (two) times daily.     Yes Historical Provider, MD  multivitamin [facility]) per tablet Take 1 tablet by mouth daily.     Yes Historical Provider, MD  potassium chloride SA (K-DUR,KLOR-CON) 20 MEQ tablet Take 20 mEq by mouth daily.     Yes Historical Provider, MD  terazosin (HYTRIN) 5 MG capsule Take 1 capsule (5 mg total) by mouth at bedtime. 01/05/13  Yes Joshua Chadwell, PA-C  valsartan (DIOVAN) 80 MG tablet Take 40 mg by mouth daily. 01/05/13  Yes Joshua Chadwell, PA-C  vitamin C (ASCORBIC ACID) 500 MG tablet Take 500 mg by mouth daily.     Yes Historical Provider, MD    I have reviewed the patient's current medications.  Labs:  Results for orders placed or performed during the hospital encounter of 07/21/15 (from the past 48 hour(s))  Carboxyhemoglobin     Status: Abnormal   Collection Time: 07/23/15  2:20 PM  Result Value Ref Range   Total hemoglobin 10.0 (L) 13.5 - 18.0 g/dL   O2 Saturation 68.4 %   Carboxyhemoglobin 2.4 (H) 0.5 - 1.5 %   Methemoglobin 0.9 0.0 -  1.5 %  MRSA PCR Screening     Status: None   Collection Time: 07/23/15  4:41 PM  Result Value Ref Range   MRSA by PCR NEGATIVE NEGATIVE    Comment:        The  GeneXpert MRSA Assay (FDA approved for NASAL specimens only), is one component of a comprehensive MRSA colonization surveillance program. It is not intended to diagnose MRSA infection nor to guide or monitor treatment for MRSA infections.   Carboxyhemoglobin     Status: Abnormal   Collection Time: 07/23/15  5:20 PM  Result Value Ref Range   Total hemoglobin 8.2 (L) 13.5 - 18.0 g/dL   O2 Saturation 71.6 %   Carboxyhemoglobin 2.6 (H) 0.5 - 1.5 %   Methemoglobin 0.7 0.0 - 1.5 %  Basic metabolic panel     Status: Abnormal   Collection Time: 07/24/15  4:00 AM  Result Value Ref Range   Sodium 134 (L) 135 - 145 mmol/L   Potassium 3.3 (L) 3.5 - 5.1 mmol/L   Chloride 97 (L) 101 - 111 mmol/L   CO2 23 22 - 32 mmol/L   Glucose, Bld 163 (H) 65 - 99 mg/dL   BUN 67 (H) 6 - 20 mg/dL   Creatinine, Ser 2.91 (H) 0.61 - 1.24 mg/dL   Calcium 8.3 (L) 8.9 - 10.3 mg/dL   GFR calc non Af Amer 19 (L) >60 mL/min   GFR calc Af Amer 22 (L) >60 mL/min    Comment: (NOTE) The eGFR has been calculated using the CKD EPI equation. This calculation has not been validated in all clinical situations. eGFR's persistently <60 mL/min signify possible Chronic Kidney Disease.    Anion gap 14 5 - 15  Carboxyhemoglobin     Status: Abnormal   Collection Time: 07/24/15  4:10 AM  Result Value Ref Range   Total hemoglobin 8.1 (L) 13.5 - 18.0 g/dL   O2 Saturation 77.1 %   Carboxyhemoglobin 1.9 (H) 0.5 - 1.5 %   Methemoglobin 0.9 0.0 - 1.5 %  Urinalysis, Routine w reflex microscopic (not at [facility])     Status: Abnormal   Collection Time: 07/24/15 10:35 AM  Result Value Ref Range   Color, Urine BROWN (A) YELLOW    Comment: BIOCHEMICALS MAY BE AFFECTED BY COLOR   APPearance TURBID (A) CLEAR   Specific Gravity, Urine 1.017 1.005 - 1.030   pH 5.0 5.0 - 8.0    Glucose, UA NEGATIVE NEGATIVE mg/dL   Hgb urine dipstick LARGE (A) NEGATIVE   Bilirubin Urine SMALL (A) NEGATIVE   Ketones, ur 15 (A) NEGATIVE mg/dL   Protein, ur 30 (A) NEGATIVE mg/dL   Nitrite NEGATIVE NEGATIVE   Leukocytes, UA SMALL (A) NEGATIVE  Urine microscopic-add on     Status: Abnormal   Collection Time: 07/24/15 10:35 AM  Result Value Ref Range   Squamous Epithelial / LPF 0-5 (A) NONE SEEN   WBC, UA TOO NUMEROUS TO COUNT 0 - 5 WBC/hpf   RBC / HPF TOO NUMEROUS TO COUNT 0 - 5 RBC/hpf   Bacteria, UA MANY (A) NONE SEEN   Casts GRANULAR CAST (A) NEGATIVE   Urine-Other URINALYSIS PERFORMED ON SUPERNATANT   CBC with Differential/Platelet     Status: Abnormal   Collection Time: 07/24/15 11:29 AM  Result Value Ref Range   WBC 11.8 (H) 4.0 - 10.5 K/uL   RBC 2.43 (L) 4.22 - 5.81 MIL/uL   Hemoglobin 8.1 (L) 13.0 - 17.0 g/dL   HCT 24.6 (L) 39.0 - 52.0 %   MCV 101.2 (H) 78.0 - 100.0 fL   MCH 33.3 26.0 - 34.0 pg   MCHC 32.9 30.0 - 36.0 g/dL   RDW 13.2 11.5 - 15.5 %   Platelets 75 (L) 150 -  400 K/uL    Comment: CONSISTENT WITH PREVIOUS RESULT   Neutrophils Relative % 90 %   Neutro Abs 10.5 (H) 1.7 - 7.7 K/uL   Lymphocytes Relative 4 %   Lymphs Abs 0.5 (L) 0.7 - 4.0 K/uL   Monocytes Relative 6 %   Monocytes Absolute 0.7 0.1 - 1.0 K/uL   Eosinophils Relative 0 %   Eosinophils Absolute 0.0 0.0 - 0.7 K/uL   Basophils Relative 0 %   Basophils Absolute 0.0 0.0 - 0.1 K/uL  Procalcitonin - Baseline     Status: None   Collection Time: 07/24/15 11:29 AM  Result Value Ref Range   Procalcitonin 18.40 ng/mL    Comment:        Interpretation: PCT >= 10 ng/mL: Important systemic inflammatory response, almost exclusively due to severe bacterial sepsis or septic shock. (NOTE)         ICU PCT Algorithm               Non ICU PCT Algorithm    ----------------------------     ------------------------------         PCT < 0.25 ng/mL                 PCT < 0.1 ng/mL     Stopping of antibiotics             Stopping of antibiotics       strongly encouraged.               strongly encouraged.    ----------------------------     ------------------------------       PCT level decrease by               PCT < 0.25 ng/mL       >= 80% from peak PCT       OR PCT 0.25 - 0.5 ng/mL          Stopping of antibiotics                                             encouraged.     Stopping of antibiotics           encouraged.    ----------------------------     ------------------------------       PCT level decrease by              PCT >= 0.25 ng/mL       < 80% from peak PCT        AND PCT >= 0.5 ng/mL             Continuing antibiotics                                              encouraged.       Continuing antibiotics            encouraged.    ----------------------------     ------------------------------     PCT level increase compared          PCT > 0.5 ng/mL         with peak PCT AND          PCT >= 0.5 ng/mL  Escalation of antibiotics                                          strongly encouraged.      Escalation of antibiotics        strongly encouraged.   Basic metabolic panel     Status: Abnormal   Collection Time: 07/24/15  1:24 PM  Result Value Ref Range   Sodium 131 (L) 135 - 145 mmol/L   Potassium 3.5 3.5 - 5.1 mmol/L   Chloride 94 (L) 101 - 111 mmol/L   CO2 22 22 - 32 mmol/L   Glucose, Bld 338 (H) 65 - 99 mg/dL   BUN 70 (H) 6 - 20 mg/dL   Creatinine, Ser 3.05 (H) 0.61 - 1.24 mg/dL   Calcium 8.0 (L) 8.9 - 10.3 mg/dL   GFR calc non Af Amer 18 (L) >60 mL/min   GFR calc Af Amer 20 (L) >60 mL/min    Comment: (NOTE) The eGFR has been calculated using the CKD EPI equation. This calculation has not been validated in all clinical situations. eGFR's persistently <60 mL/min signify possible Chronic Kidney Disease.    Anion gap 15 5 - 15  Uric acid     Status: Abnormal   Collection Time: 07/24/15  1:25 PM  Result Value Ref Range   Uric Acid, Serum 11.9 (H) 4.4 - 7.6  mg/dL  Glucose, capillary     Status: Abnormal   Collection Time: 07/24/15  6:08 PM  Result Value Ref Range   Glucose-Capillary 182 (H) 65 - 99 mg/dL  Glucose, capillary     Status: Abnormal   Collection Time: 07/24/15 10:00 PM  Result Value Ref Range   Glucose-Capillary 187 (H) 65 - 99 mg/dL   Comment 1 Capillary Specimen   Carboxyhemoglobin     Status: Abnormal   Collection Time: 07/25/15  4:10 AM  Result Value Ref Range   Total hemoglobin 10.7 (L) 13.5 - 18.0 g/dL   O2 Saturation 81.4 %   Carboxyhemoglobin 1.0 0.5 - 1.5 %   Methemoglobin 0.8 0.0 - 1.5 %  Procalcitonin     Status: None   Collection Time: 07/25/15  4:10 AM  Result Value Ref Range   Procalcitonin 27.37 ng/mL    Comment:        Interpretation: PCT >= 10 ng/mL: Important systemic inflammatory response, almost exclusively due to severe bacterial sepsis or septic shock. (NOTE)         ICU PCT Algorithm               Non ICU PCT Algorithm    ----------------------------     ------------------------------         PCT < 0.25 ng/mL                 PCT < 0.1 ng/mL     Stopping of antibiotics            Stopping of antibiotics       strongly encouraged.               strongly encouraged.    ----------------------------     ------------------------------       PCT level decrease by               PCT < 0.25 ng/mL       >= 80% from peak PCT  OR PCT 0.25 - 0.5 ng/mL          Stopping of antibiotics                                             encouraged.     Stopping of antibiotics           encouraged.    ----------------------------     ------------------------------       PCT level decrease by              PCT >= 0.25 ng/mL       < 80% from peak PCT        AND PCT >= 0.5 ng/mL             Continuing antibiotics                                              encouraged.       Continuing antibiotics            encouraged.    ----------------------------     ------------------------------     PCT level increase  compared          PCT > 0.5 ng/mL         with peak PCT AND          PCT >= 0.5 ng/mL             Escalation of antibiotics                                          strongly encouraged.      Escalation of antibiotics        strongly encouraged.   Basic metabolic panel     Status: Abnormal   Collection Time: 07/25/15  4:10 AM  Result Value Ref Range   Sodium 128 (L) 135 - 145 mmol/L   Potassium 4.1 3.5 - 5.1 mmol/L   Chloride 92 (L) 101 - 111 mmol/L   CO2 22 22 - 32 mmol/L   Glucose, Bld 313 (H) 65 - 99 mg/dL   BUN 80 (H) 6 - 20 mg/dL   Creatinine, Ser 3.69 (H) 0.61 - 1.24 mg/dL   Calcium 8.1 (L) 8.9 - 10.3 mg/dL   GFR calc non Af Amer 14 (L) >60 mL/min   GFR calc Af Amer 16 (L) >60 mL/min    Comment: (NOTE) The eGFR has been calculated using the CKD EPI equation. This calculation has not been validated in all clinical situations. eGFR's persistently <60 mL/min signify possible Chronic Kidney Disease.    Anion gap 14 5 - 15  CBC with Differential/Platelet     Status: Abnormal   Collection Time: 07/25/15  4:10 AM  Result Value Ref Range   WBC 15.0 (H) 4.0 - 10.5 K/uL   RBC 2.65 (L) 4.22 - 5.81 MIL/uL   Hemoglobin 8.8 (L) 13.0 - 17.0 g/dL   HCT 27.0 (L) 39.0 - 52.0 %   MCV 101.9 (H) 78.0 - 100.0 fL   MCH 33.2 26.0 - 34.0 pg   MCHC 32.6 30.0 - 36.0 g/dL   RDW 13.4 11.5 -  15.5 %   Platelets 97 (L) 150 - 400 K/uL    Comment: CONSISTENT WITH PREVIOUS RESULT   Neutrophils Relative % 94 %   Neutro Abs 14.1 (H) 1.7 - 7.7 K/uL   Lymphocytes Relative 2 %   Lymphs Abs 0.2 (L) 0.7 - 4.0 K/uL   Monocytes Relative 4 %   Monocytes Absolute 0.7 0.1 - 1.0 K/uL   Eosinophils Relative 0 %   Eosinophils Absolute 0.0 0.0 - 0.7 K/uL   Basophils Relative 0 %   Basophils Absolute 0.0 0.0 - 0.1 K/uL  Glucose, capillary     Status: Abnormal   Collection Time: 07/25/15  8:00 AM  Result Value Ref Range   Glucose-Capillary 231 (H) 65 - 99 mg/dL   Comment 1 Venous Specimen   Glucose, capillary      Status: Abnormal   Collection Time: 07/25/15 12:30 PM  Result Value Ref Range   Glucose-Capillary 206 (H) 65 - 99 mg/dL   Comment 1 Venous Specimen      ROS:  Pertinent items are noted in HPI.  Physical Exam: Filed Vitals:   07/25/15 1246 07/25/15 1300  BP:  92/51  Pulse:    Temp: 97.5 F (36.4 C)   Resp:  16     General: Alert, NAD Eyes: EOMI Neck: supple Heart: irregularly, irregular rhythm, tachycardia Lungs: CTAB Abdomen:  Obese, soft nontender Extremities: no LE edema Neuro: no focal deficits  Assessment/Plan: 1.Renal- AoCKD baseline creatinine 1.8, now 3.69. UOP decreasing to minimal output today. Concern that renal injury due to potential reduced cardiac output in setting of CHF with afib with RVR and sepsis. 2. Hypertension/volume  - Hypotensive with pressor dependence likely multi-factorial from CHF and sepsis. There concern for volume overload with increasing weight to 202 lb today from 193 on 5/1 in setting of CHF exacerbation with falling renal function. Lasix 160 q6 and will assess response. At this time risk of dialysis would outweigh the potential benefits 4. Anemia  - Hgb 8.8, stable    Haney,Alyssa 07/25/2015, 1:57 PM   Renal Attending: Pt has known CHF, radiographic evidence and clinical findings, low BP and question of sepsis.  He failed a lasix drip at 34m/hr. Renal asked to assist with volume removal.  Will give trial of high dose furosemide.  Will evaluate for CRRT if fails to diurese and maintains a BP to support use of CRRT. Daleon Willinger C.

## 2015-07-26 ENCOUNTER — Inpatient Hospital Stay (HOSPITAL_COMMUNITY): Payer: Medicare Other

## 2015-07-26 DIAGNOSIS — N19 Unspecified kidney failure: Secondary | ICD-10-CM

## 2015-07-26 LAB — GLUCOSE, CAPILLARY
GLUCOSE-CAPILLARY: 245 mg/dL — AB (ref 65–99)
GLUCOSE-CAPILLARY: 189 mg/dL — AB (ref 65–99)
GLUCOSE-CAPILLARY: 275 mg/dL — AB (ref 65–99)
Glucose-Capillary: 224 mg/dL — ABNORMAL HIGH (ref 65–99)

## 2015-07-26 LAB — RENAL FUNCTION PANEL
ALBUMIN: 2.1 g/dL — AB (ref 3.5–5.0)
Anion gap: 20 — ABNORMAL HIGH (ref 5–15)
BUN: 103 mg/dL — ABNORMAL HIGH (ref 6–20)
CALCIUM: 8.3 mg/dL — AB (ref 8.9–10.3)
CO2: 18 mmol/L — AB (ref 22–32)
CREATININE: 5.1 mg/dL — AB (ref 0.61–1.24)
Chloride: 87 mmol/L — ABNORMAL LOW (ref 101–111)
GFR calc non Af Amer: 9 mL/min — ABNORMAL LOW (ref >60)
GFR, EST AFRICAN AMERICAN: 11 mL/min — AB (ref >60)
GLUCOSE: 231 mg/dL — AB (ref 65–99)
PHOSPHORUS: 6.6 mg/dL — AB (ref 2.5–4.6)
Potassium: 5 mmol/L (ref 3.5–5.1)
SODIUM: 125 mmol/L — AB (ref 135–145)

## 2015-07-26 LAB — CBC
HEMATOCRIT: 31.7 % — AB (ref 39.0–52.0)
HEMOGLOBIN: 10.8 g/dL — AB (ref 13.0–17.0)
MCH: 34.1 pg — ABNORMAL HIGH (ref 26.0–34.0)
MCHC: 34.1 g/dL (ref 30.0–36.0)
MCV: 100 fL (ref 78.0–100.0)
Platelets: 200 10*3/uL (ref 150–400)
RBC: 3.17 MIL/uL — ABNORMAL LOW (ref 4.22–5.81)
RDW: 13.1 % (ref 11.5–15.5)
WBC: 18.8 10*3/uL — ABNORMAL HIGH (ref 4.0–10.5)

## 2015-07-26 LAB — BASIC METABOLIC PANEL
ANION GAP: 18 — AB (ref 5–15)
BUN: 100 mg/dL — ABNORMAL HIGH (ref 6–20)
CALCIUM: 8.2 mg/dL — AB (ref 8.9–10.3)
CO2: 21 mmol/L — AB (ref 22–32)
Chloride: 88 mmol/L — ABNORMAL LOW (ref 101–111)
Creatinine, Ser: 4.79 mg/dL — ABNORMAL HIGH (ref 0.61–1.24)
GFR, EST AFRICAN AMERICAN: 12 mL/min — AB (ref >60)
GFR, EST NON AFRICAN AMERICAN: 10 mL/min — AB (ref >60)
Glucose, Bld: 221 mg/dL — ABNORMAL HIGH (ref 65–99)
POTASSIUM: 4.7 mmol/L (ref 3.5–5.1)
Sodium: 127 mmol/L — ABNORMAL LOW (ref 135–145)

## 2015-07-26 LAB — APTT: aPTT: 45 seconds — ABNORMAL HIGH (ref 24–37)

## 2015-07-26 LAB — HEPARIN LEVEL (UNFRACTIONATED): Heparin Unfractionated: >2.2 IU/mL — ABNORMAL HIGH (ref 0.30–0.70)

## 2015-07-26 LAB — CARBOXYHEMOGLOBIN
Carboxyhemoglobin: 1 % (ref 0.5–1.5)
METHEMOGLOBIN: 1 % (ref 0.0–1.5)
O2 Saturation: 82.3 %
Total hemoglobin: 13.3 g/dL — ABNORMAL LOW (ref 13.5–18.0)

## 2015-07-26 LAB — PROCALCITONIN: PROCALCITONIN: 30.91 ng/mL

## 2015-07-26 MED ORDER — PRISMASOL BGK 4/2.5 32-4-2.5 MEQ/L IV SOLN
INTRAVENOUS | Status: DC
  Filled 2015-07-26 (×2): qty 5000

## 2015-07-26 MED ORDER — PRISMASOL BGK 4/2.5 32-4-2.5 MEQ/L IV SOLN
INTRAVENOUS | Status: DC
  Administered 2015-07-26 – 2015-08-05 (×16): via INTRAVENOUS_CENTRAL
  Filled 2015-07-26 (×21): qty 5000

## 2015-07-26 MED ORDER — PRISMASOL BGK 4/2.5 32-4-2.5 MEQ/L IV SOLN
INTRAVENOUS | Status: DC
  Filled 2015-07-26 (×9): qty 5000

## 2015-07-26 MED ORDER — PRISMASOL BGK 4/2.5 32-4-2.5 MEQ/L IV SOLN
INTRAVENOUS | Status: DC
Start: 2015-07-26 — End: 2015-08-05
  Administered 2015-07-26 – 2015-08-05 (×12): via INTRAVENOUS_CENTRAL
  Filled 2015-07-26 (×11): qty 5000

## 2015-07-26 MED ORDER — ALTEPLASE 2 MG IJ SOLR: 2.0000 mg | Freq: Once | INTRAMUSCULAR | Status: DC | PRN

## 2015-07-26 MED ORDER — PRISMASOL BGK 4/2.5 32-4-2.5 MEQ/L IV SOLN
INTRAVENOUS | Status: DC
  Administered 2015-07-26 – 2015-08-05 (×38): via INTRAVENOUS_CENTRAL
  Filled 2015-07-26 (×51): qty 5000

## 2015-07-26 MED ORDER — VANCOMYCIN HCL IN DEXTROSE 1-5 GM/200ML-% IV SOLN
1000.0000 mg | INTRAVENOUS | Status: DC
  Administered 2015-07-26 – 2015-07-28 (×3): 1000 mg via INTRAVENOUS
  Filled 2015-07-26 (×4): qty 200

## 2015-07-26 MED ORDER — FENTANYL CITRATE (PF) 100 MCG/2ML IJ SOLN
50.0000 ug | INTRAMUSCULAR | Status: DC | PRN
  Administered 2015-07-26 – 2015-08-05 (×5): 50 ug via INTRAVENOUS
  Filled 2015-07-26 (×5): qty 2

## 2015-07-26 MED ORDER — HEPARIN (PORCINE) IN NACL 100-0.45 UNIT/ML-% IJ SOLN
1050.0000 [IU]/h | INTRAMUSCULAR | Status: DC
  Administered 2015-07-26: 1150 [IU]/h via INTRAVENOUS
  Administered 2015-07-27 – 2015-07-29 (×4): 950 [IU]/h via INTRAVENOUS
  Administered 2015-07-30: 1050 [IU]/h via INTRAVENOUS
  Filled 2015-07-26 (×7): qty 250

## 2015-07-26 MED ORDER — CETYLPYRIDINIUM CHLORIDE 0.05 % MT LIQD
7.0000 mL | Freq: Two times a day (BID) | OROMUCOSAL | Status: DC
  Administered 2015-07-26 – 2015-08-05 (×20): 7 mL via OROMUCOSAL

## 2015-07-26 MED ORDER — HEPARIN (PORCINE) 2000 UNITS/L FOR CRRT
INTRAVENOUS_CENTRAL | Status: DC | PRN
  Administered 2015-07-26: 19:00:00 via INTRAVENOUS_CENTRAL
  Filled 2015-07-26 (×2): qty 1000

## 2015-07-26 MED ORDER — SODIUM CHLORIDE 0.9 % FOR CRRT
INTRAVENOUS_CENTRAL | Status: DC | PRN
  Filled 2015-07-26: qty 1000

## 2015-07-26 MED ORDER — HEPARIN SODIUM (PORCINE) 1000 UNIT/ML DIALYSIS
1000.0000 [IU] | INTRAMUSCULAR | Status: DC | PRN
  Administered 2015-07-26 – 2015-08-05 (×2): 2800 [IU] via INTRAVENOUS_CENTRAL
  Filled 2015-07-26 (×2): qty 6
  Filled 2015-07-26: qty 4
  Filled 2015-07-26 (×2): qty 6

## 2015-07-26 MED ORDER — PREDNISONE 20 MG PO TABS
40.0000 mg | ORAL_TABLET | Freq: Every day | ORAL | Status: AC
  Administered 2015-07-27 – 2015-07-28 (×2): 40 mg via ORAL
  Filled 2015-07-26 (×2): qty 2

## 2015-07-26 MED ORDER — PIPERACILLIN-TAZOBACTAM IN DEX 2-0.25 GM/50ML IV SOLN
2.2500 g | Freq: Four times a day (QID) | INTRAVENOUS | Status: DC
  Administered 2015-07-26 – 2015-07-30 (×16): 2.25 g via INTRAVENOUS
  Filled 2015-07-26 (×19): qty 50

## 2015-07-26 MED ORDER — HEPARIN SODIUM (PORCINE) 1000 UNIT/ML DIALYSIS
1000.0000 [IU] | INTRAMUSCULAR | Status: DC | PRN
  Filled 2015-07-26: qty 6
  Filled 2015-07-26: qty 4

## 2015-07-26 NOTE — Progress Notes (Signed)
ANTICOAGULATION CONSULT NOTE - Initial Consult  Pharmacy Consult for apixaban --> Heparin Indication: atrial fibrillation  Allergies  Allergen Reactions  . Heparin Other (See Comments)    Unknown reaction. HIT Panel negative in 2014.    Patient Measurements: Height: 5' 7"  (170.2 cm) Weight: 201 lb 11.5 oz (91.5 kg) IBW/kg (Calculated) : 66.1 Heparin Dosing Weight: 84.1 kg  Vital Signs: Temp: 97.6 F (36.4 C) (05/06 1200) Temp Source: Oral (05/06 1200) BP: 115/62 mmHg (05/06 1400)  Labs:  Recent Labs  07/24/15 1129 07/24/15 1324 07/25/15 0410 07/26/15 0439 07/26/15 1200  HGB 8.1*  --  8.8* 10.8*  --   HCT 24.6*  --  27.0* 31.7*  --   PLT 75*  --  97* 200  --   APTT  --   --   --   --  45*  HEPARINUNFRC  --   --   --   --  >2.20*  CREATININE  --  3.05* 3.69* 4.79*  --     Estimated Creatinine Clearance: 12.8 mL/min (by C-G formula based on Cr of 4.79).   Medical History: Past Medical History  Diagnosis Date  . Gout   . Hypertension   . Old anterior wall myocardial infarction   . Thrombocytopenia, immune   . Atrial fibrillation ([place])   . Hyperlipidemia   . ICD-Medtronic 11/25/2008    09/28/2007  Medtronic Sprint Quattro Secure S model 6935-65 (serial # [id number])                 Medtronic Virtuoso VR model D15VWC (serial # [id number]) single chamber ICD   . CAD (coronary artery disease)     1985 Anterior MI 1985 with LIMA to LAD [place] PTCA to RCA x2 July 2009 Cath EF 20-25%  Patent LIMA to LAD, occluded LAD, Mild to moderate disease in RCA and Circ July 2014 Lexiscan Myoview  EF 32% anterior MI, no ischemia    . History of breast cancer in male   . Ischemic cardiomyopathy   . Osteoarthritis   . Breast cancer ([place])     dx at age 32  . BRCA2 genetic carrier 2014  . CHF (congestive heart failure) ([place])   . Shortness of breath dyspnea     Medications:  Prescriptions prior to admission  Medication Sig Dispense Refill Last Dose  . apixaban  (ELIQUIS) 2.5 MG TABS tablet Take 1 tablet (2.5 mg total) by mouth 2 (two) times daily. (Patient taking differently: Take 2.5 mg by mouth 2 (two) times daily. ) 60 tablet 0 07/21/2015 at Unknown time  . atorvastatin (LIPITOR) 40 MG tablet Take 40 mg by mouth daily.   07/21/2015 at Unknown time  . ferrous sulfate 325 (65 FE) MG tablet Take 325 mg by mouth daily with breakfast.   07/21/2015 at Unknown time  . furosemide (LASIX) 80 MG tablet Take 80 mg by mouth 2 (two) times daily.   07/21/2015 at Unknown time  . metoprolol (LOPRESSOR) 50 MG tablet Take 50 mg by mouth 2 (two) times daily.     07/21/2015 at 1330  . multivitamin (THERAGRAN) per tablet Take 1 tablet by mouth daily.     07/21/2015 at Unknown time  . potassium chloride SA (K-DUR,KLOR-CON) 20 MEQ tablet Take 20 mEq by mouth daily.     07/21/2015 at Unknown time  . terazosin (HYTRIN) 5 MG capsule Take 1 capsule (5 mg total) by mouth at bedtime.   07/21/2015 at Unknown time  . valsartan (  DIOVAN) 80 MG tablet Take 40 mg by mouth daily.   07/21/2015 at Unknown time  . vitamin C (ASCORBIC ACID) 500 MG tablet Take 500 mg by mouth daily.     07/21/2015 at Unknown time    Assessment: 82 yo M admitted with low output HF. Pt has chronic afib and was taking apixaban PTA. He is now to be initiated on CVVHD due to acute renal failure, so apixaban is being discontinued and switched to heparin.  Baseline HL >2.20, aPTT 45 sec, Hgb 10.8, Plt 200  We will dose heparin based on aPTT for now until the HL begins to correlate.  Goal of Therapy:  aPTT 66-102 seconds Monitor platelets by anticoagulation protocol: Yes   Plan:  --Start heparin infusion at 1150 units/hr (~14 units/kg/hr - no bolus) --check aPTT in 8 hr --Daily aPTT/HL/CBC --monitor for bleeding --f/u resumption of apixaban when able  Governor Specking, PharmD Clinical Pharmacy Resident Pager: [phone] 07/26/2015,3:49 PM

## 2015-07-26 NOTE — Progress Notes (Signed)
Pharmacy Antibiotic Note  Martin [name] is a 81 y.o. male on vancomycin and Zosyn for possible sepsis 2/2 UTI vs HCAP.   Pt remains afebrile with WBC 18.8 and PCT 30.91, both trending up. Renal function has continued to worsen with SCr 2.2 > 2.4 > 2.9 > 3.05 > 3.69 > 4.79. Nephrology is to begin CVVHD today.   Plan: --Change vancomycin to 1 g q24h for CVVHD --Vancomycin trough when appropriate --Change to Zosyn 2.25 g q6h for CVVHD  Height: 5\' 7"  (170.2 cm) Weight: 201 lb 11.5 oz (91.5 kg) IBW/kg (Calculated) : 66.1  Temp (24hrs), Avg:97.7 F (36.5 C), Min:97.5 F (36.4 C), Max:98 F (36.7 C)   Recent Labs Lab 07/21/15 1838  07/23/15 0308 07/24/15 0400 07/24/15 1129 07/24/15 1324 07/25/15 0410 07/26/15 0439  WBC 9.1  --   --   --  11.8*  --  15.0* 18.8*  CREATININE 2.25*  < > 2.42* 2.91*  --  3.05* 3.69* 4.79*  < > = values in this interval not displayed.  Estimated Creatinine Clearance: 12.8 mL/min (by C-G formula based on Cr of 4.79).    Allergies  Allergen Reactions  . Heparin Other (See Comments)    Unknown reaction. HIT Panel negative in 2014.    Antimicrobials this admission: 5/4 Vancomycin >> 5/4 Ceftazidime >> 5/5 5/5 Zosyn >>  Dose adjustments this admission: 5/6 Vancomycin 1g q24h for CVVHD 5/6 Zosyn 2.25 g q6h for CVVHD  Microbiology results: 5/4 UCx>> 7K colonies, insignificant 5/4 BCx>> NGx1D 5/3 MRSA PCR neg  Thank you for allowing pharmacy to be a part of this patient's care.  Governor Specking, PharmD Clinical Pharmacy Resident Pager: [phone] 07/26/2015 3:59 PM

## 2015-07-26 NOTE — Progress Notes (Signed)
Assessment/Plan: 1.Renal- AoCKD baseline creatinine 1.8.  Pt has failed higher dose trial of diuretics.  2. Hypertension/volume - Hypotensive with pressor dependence likely multi-factorial from CHF and sepsis.   Plan: Will try CVVHD and see if volume status and hemodynamics can be improved.  Subjective: Interval History: No significant UOP  Objective: Vital signs in last 24 hours: Temp:  [97.5 F (36.4 C)-98 F (36.7 C)] 97.6 F (36.4 C) (05/06 1200) Resp:  [14-22] 22 (05/06 1200) BP: (77-110)/(42-64) 109/63 mmHg (05/06 1200) SpO2:  [95 %-99 %] 98 % (05/06 1200) Weight:  [91.5 kg (201 lb 11.5 oz)] 91.5 kg (201 lb 11.5 oz) (05/06 0500) Weight change: -0.3 kg (-10.6 oz)  Intake/Output from previous day: 05/05 0701 - 05/06 0700 In: 2090.6 [P.O.:440; I.V.:1302.6; IV Piggyback:348] Out: 10 [Urine:10] Intake/Output this shift: Total I/O In: 614.4 [P.O.:150; I.V.:348.4; IV Piggyback:116] Out: 0   General appearance: alert and cooperative Resp: rales bilaterally Extremities: edema 1-2+  Lab Results:  Recent Labs  07/25/15 0410 07/26/15 0439  WBC 15.0* 18.8*  HGB 8.8* 10.8*  HCT 27.0* 31.7*  PLT 97* 200   BMET:  Recent Labs  07/25/15 0410 07/26/15 0439  NA 128* 127*  K 4.1 4.7  CL 92* 88*  CO2 22 21*  GLUCOSE 313* 221*  BUN 80* 100*  CREATININE 3.69* 4.79*  CALCIUM 8.1* 8.2*   No results for input(s): PTH in the last 72 hours. Iron Studies: No results for input(s): IRON, TIBC, TRANSFERRIN, FERRITIN in the last 72 hours. Studies/Results: Dg Chest Port 1 View  07/25/2015  CLINICAL DATA:  Shortness of breath. EXAM: PORTABLE CHEST 1 VIEW COMPARISON:  07/23/2015. FINDINGS: Right PICC line stable position. Prior CABG. Cardiac pacer with lead tip projected right ventricle. Cardiomegaly with pulmonary vascular prominence and bilateral pulmonary infiltrates, right side greater left consistent congestive heart failure. Right side pleural effusion. No pneumothorax  IMPRESSION: 1.  Right PICC line stable position. 2. Prior CABG. Congestive heart failure with pulmonary edema, right side greater than left. Right pleural effusion. Findings have progressed from prior exam . Electronically Signed   By: [place]   On: 07/25/2015 09:54    Scheduled: . antiseptic oral rinse  7 mL Mouth Rinse BID  . atorvastatin  40 mg Oral q1800  . ferrous sulfate  325 mg Oral Q breakfast  . furosemide  160 mg Intravenous Q6H  . insulin aspart  0-15 Units Subcutaneous TID WC  . insulin aspart  0-5 Units Subcutaneous QHS  . piperacillin-tazobactam (ZOSYN)  IV  2.25 g Intravenous Q6H  . [START ON 07/27/2015] predniSONE  40 mg Oral Q breakfast  . sodium chloride flush  10-40 mL Intracatheter Q12H  . sodium chloride flush  3 mL Intravenous Q12H  . vancomycin  1,000 mg Intravenous Q24H       LOS: 5 days   Miyuki Rzasa C 07/26/2015,1:08 PM

## 2015-07-26 NOTE — Procedures (Signed)
Central Venous Catheter Insertion Procedure Note Martin [name] [id number] 1930-08-16  Procedure: Insertion of Central Venous Catheter Indications: for dialysis  Procedure Details Consent: Risks of procedure as well as the alternatives and risks of each were explained to the (patient/caregiver).  Consent for procedure obtained. Time Out: Verified patient identification, verified procedure, site/side was marked, verified correct patient position, special equipment/implants available, medications/allergies/relevent history reviewed, required imaging and test results available.  Performed  Maximum sterile technique was used including antiseptics, cap, gloves, gown, hand hygiene, mask and sheet. Skin prep: Chlorhexidine; local anesthetic administered A triple lumen HD catheter was placed in the right internal jugular vein using the Seldinger technique under real time US guidance  Evaluation Blood flow good Complications: No apparent complications Patient did tolerate procedure well. Chest X-ray ordered to verify placement.  CXR: pending.   Martin Apo, MD, PhD 07/26/2015, 1:36 PM [name] [phone] or if no answer [phone]

## 2015-07-26 NOTE — Progress Notes (Addendum)
Advanced Heart Failure Rounding Note   Subjective:    Martin [name] is an 82 year old male with h/o CAD s/p anterior MI in 1985 followed by CABG and PCI of RCA, chronic systolic HF EF [id number], chronic AF and CKD followed by Dr. Wynonia Lawman. We have been asked to consult for possible low output HF.   Over past few weeks has been struggling with progressive volume overload, fatigue and DOE. Saw Dr. Wynonia Lawman in the office and was refractory to titration of oral diuretics so was admitted for IV diuresis. Did not respond well to IV lasix. Developed renal failure.  Moved to ICU due to hypotension. Urine with many bacteria. Developed urosepsis. Milrinone stopped norepi started.   Feels fine. SBP still 80-90s on norepi 38. Denies dyspnes. Anuric. Creatinine continues to wrosen.    UA- many bacteria. No growth  Blood Cultures- NGTD   Objective:   Weight Range:  Vital Signs:   Temp:  [97.5 F (36.4 C)-98 F (36.7 C)] 97.7 F (36.5 C) (05/06 0800) Pulse Rate:  [125] 125 (05/05 1200) Resp:  [14-22] 22 (05/06 1000) BP: (77-110)/(42-64) 89/50 mmHg (05/06 1000) SpO2:  [95 %-98 %] 96 % (05/06 1000) Weight:  [91.5 kg (201 lb 11.5 oz)] 91.5 kg (201 lb 11.5 oz) (05/06 0500) Last BM Date: 07/25/15  Weight change: Filed Weights   07/24/15 0407 07/25/15 0500 07/26/15 0500  Weight: 87.363 kg (192 lb 9.6 oz) 91.8 kg (202 lb 6.1 oz) 91.5 kg (201 lb 11.5 oz)    Intake/Output:   Intake/Output Summary (Last 24 hours) at 07/26/15 1020 Last data filed at 07/26/15 0815  Gross per 24 hour  Intake 1828.21 ml  Output     10 ml  Net 1818.21 ml     Physical Exam: CVP 9-10 General: Elderly male. Lying in bed NAD HEENT: normal Neck: supple. JVP 10  Carotids 2+ bilat; no bruits. No lymphadenopathy or thryomegaly appreciated. Cor: PMI laterally displaced. IRR Tachy Lungs: EW. Decreased in the bases. On 2 liters [place] Abdomen: soft, nontender, NT. No hepatosplenomegaly. No bruits or masses. Good bowel  sounds. Extremities: no cyanosis, clubbing, rash, trace 1+ edema Neuro: alert & orientedx3, cranial nerves grossly intact. moves all 4 extremities w/o difficulty. Affect pleasant  Telemetry: AF 100-115  Labs: Basic Metabolic Panel:  Recent Labs Lab 07/23/15 0308 07/24/15 0400 07/24/15 1324 07/25/15 0410 07/26/15 0439  NA 138 134* 131* 128* 127*  K 3.7 3.3* 3.5 4.1 4.7  CL 100* 97* 94* 92* 88*  CO2 24 23 22 22  21*  GLUCOSE 136* 163* 338* 313* 221*  BUN 55* 67* 70* 80* 100*  CREATININE 2.42* 2.91* 3.05* 3.69* 4.79*  CALCIUM 8.9 8.3* 8.0* 8.1* 8.2*    Liver Function Tests:  Recent Labs Lab 07/21/15 1838  AST 33  ALT 25  ALKPHOS 132*  BILITOT 2.0*  PROT 7.7  ALBUMIN 3.5   No results for input(s): LIPASE, AMYLASE in the last 168 hours. No results for input(s): AMMONIA in the last 168 hours.  CBC:  Recent Labs Lab 07/21/15 1838 07/24/15 1129 07/25/15 0410 07/26/15 0439  WBC 9.1 11.8* 15.0* 18.8*  NEUTROABS 7.5 10.5* 14.1*  --   HGB 10.1* 8.1* 8.8* 10.8*  HCT 31.3* 24.6* 27.0* 31.7*  MCV 103.6* 101.2* 101.9* 100.0  PLT 93* 75* 97* 200    Cardiac Enzymes: No results for input(s): CKTOTAL, CKMB, CKMBINDEX, TROPONINI in the last 168 hours.  BNP: BNP (last 3 results)  Recent Labs  07/21/15 1838  BNP 1660.5*    ProBNP (last 3 results) No results for input(s): PROBNP in the last 8760 hours.    Other results:  Imaging: Dg Chest Port 1 View  07/25/2015  CLINICAL DATA:  Shortness of breath. EXAM: PORTABLE CHEST 1 VIEW COMPARISON:  07/23/2015. FINDINGS: Right PICC line stable position. Prior CABG. Cardiac pacer with lead tip projected right ventricle. Cardiomegaly with pulmonary vascular prominence and bilateral pulmonary infiltrates, right side greater left consistent congestive heart failure. Right side pleural effusion. No pneumothorax IMPRESSION: 1.  Right PICC line stable position. 2. Prior CABG. Congestive heart failure with pulmonary edema, right  side greater than left. Right pleural effusion. Findings have progressed from prior exam . Electronically Signed   By: [place]   On: 07/25/2015 09:54     Medications:     Scheduled Medications: . antiseptic oral rinse  7 mL Mouth Rinse BID  . apixaban  2.5 mg Oral BID  . atorvastatin  40 mg Oral q1800  . ferrous sulfate  325 mg Oral Q breakfast  . furosemide  160 mg Intravenous Q6H  . insulin aspart  0-15 Units Subcutaneous TID WC  . insulin aspart  0-5 Units Subcutaneous QHS  . piperacillin-tazobactam (ZOSYN)  IV  2.25 g Intravenous Q8H  . sodium chloride flush  10-40 mL Intracatheter Q12H  . sodium chloride flush  3 mL Intravenous Q12H  . vancomycin  1,000 mg Intravenous Q48H    Infusions: . amiodarone 60 mg/hr (07/26/15 0818)  . norepinephrine (LEVOPHED) Adult infusion 38 mcg/min (07/26/15 0831)  . vasopressin (PITRESSIN) infusion - *FOR SHOCK*      PRN Medications: sodium chloride, acetaminophen, guaiFENesin, levalbuterol, ondansetron (ZOFRAN) IV, sodium chloride flush, sodium chloride flush, sodium chloride flush, traMADol   Assessment:   1.Urosepsis  2. Acute on chronic systolic HF  2. Chronic AF now with RVR 3. Ischemic CM EF 20-25% 4. Acute on chronic renal failure, stage IV 5. CAD s/p anterior MI followed by CABG and PCI RCA 6. Acute respiratory failure 7. Hypotension 8. Hypokalemia 9. Acute Gout Flare  10. Cought  Plan/Discussion:    Has severe sepsis with worsening renal failure. Agree with Dr. Florene Glen that he will need CVVHD. Discussed with family about time course of possible renal recovery. If no renal recover in 1-2 weeks will need Hospice. He is not candidate for long-term HD with severe HF.   Continue BP support with norepi. Add vasopressin to keep systolics > 90.Continue vancomycin and zosyn. Cultures negative so far. PCT plateauing. Remains on amio for rate control of chronic AF but rate still fast.   Stop abixaban. Start heparin.   The  patient is critically ill with multiple organ systems failure and requires high complexity decision making for assessment and support, frequent evaluation and titration of therapies, application of advanced monitoring technologies and extensive interpretation of multiple databases.   Critical Care Time devoted to patient care services described in this note is 35 Minutes.  Tamberlyn Midgley,MD 10:20 AM Advanced Heart Failure Team Pager [phone] (M-F; 7a - 4p)  Please contact [facility] for night-coverage after hours (4p -7a ) and weekends on [email or web address]

## 2015-07-27 DIAGNOSIS — I482 Chronic atrial fibrillation: Secondary | ICD-10-CM

## 2015-07-27 DIAGNOSIS — R570 Cardiogenic shock: Secondary | ICD-10-CM

## 2015-07-27 LAB — GLUCOSE, CAPILLARY
GLUCOSE-CAPILLARY: 233 mg/dL — AB (ref 65–99)
Glucose-Capillary: 197 mg/dL — ABNORMAL HIGH (ref 65–99)
Glucose-Capillary: 216 mg/dL — ABNORMAL HIGH (ref 65–99)
Glucose-Capillary: 226 mg/dL — ABNORMAL HIGH (ref 65–99)

## 2015-07-27 LAB — CBC
HCT: 28.2 % — ABNORMAL LOW (ref 39.0–52.0)
HEMOGLOBIN: 9.9 g/dL — AB (ref 13.0–17.0)
MCH: 33.2 pg (ref 26.0–34.0)
MCHC: 35.1 g/dL (ref 30.0–36.0)
MCV: 94.6 fL (ref 78.0–100.0)
Platelets: 188 10*3/uL (ref 150–400)
RBC: 2.98 MIL/uL — AB (ref 4.22–5.81)
RDW: 13.2 % (ref 11.5–15.5)
WBC: 11.6 10*3/uL — ABNORMAL HIGH (ref 4.0–10.5)

## 2015-07-27 LAB — RENAL FUNCTION PANEL
ALBUMIN: 2.1 g/dL — AB (ref 3.5–5.0)
ANION GAP: 17 — AB (ref 5–15)
ANION GAP: 17 — AB (ref 5–15)
Albumin: 2.1 g/dL — ABNORMAL LOW (ref 3.5–5.0)
BUN: 80 mg/dL — ABNORMAL HIGH (ref 6–20)
BUN: 61 mg/dL — AB (ref 6–20)
CHLORIDE: 91 mmol/L — AB (ref 101–111)
CO2: 19 mmol/L — AB (ref 22–32)
CO2: 21 mmol/L — ABNORMAL LOW (ref 22–32)
Calcium: 8.1 mg/dL — ABNORMAL LOW (ref 8.9–10.3)
Calcium: 8.1 mg/dL — ABNORMAL LOW (ref 8.9–10.3)
Chloride: 92 mmol/L — ABNORMAL LOW (ref 101–111)
Creatinine, Ser: 4.09 mg/dL — ABNORMAL HIGH (ref 0.61–1.24)
Creatinine, Ser: 3.21 mg/dL — ABNORMAL HIGH (ref 0.61–1.24)
GFR calc non Af Amer: 12 mL/min — ABNORMAL LOW (ref >60)
GFR, EST AFRICAN AMERICAN: 14 mL/min — AB (ref >60)
GFR, EST AFRICAN AMERICAN: 19 mL/min — AB (ref >60)
GFR, EST NON AFRICAN AMERICAN: 17 mL/min — AB (ref >60)
Glucose, Bld: 238 mg/dL — ABNORMAL HIGH (ref 65–99)
Glucose, Bld: 231 mg/dL — ABNORMAL HIGH (ref 65–99)
PHOSPHORUS: 3.3 mg/dL (ref 2.5–4.6)
POTASSIUM: 4.5 mmol/L (ref 3.5–5.1)
POTASSIUM: 4.4 mmol/L (ref 3.5–5.1)
Phosphorus: 4.7 mg/dL — ABNORMAL HIGH (ref 2.5–4.6)
Sodium: 127 mmol/L — ABNORMAL LOW (ref 135–145)
Sodium: 130 mmol/L — ABNORMAL LOW (ref 135–145)

## 2015-07-27 LAB — HEPARIN LEVEL (UNFRACTIONATED): Heparin Unfractionated: >2.2 IU/mL — ABNORMAL HIGH (ref 0.30–0.70)

## 2015-07-27 LAB — APTT
APTT: 120 s — AB (ref 24–37)
APTT: 90 s — AB (ref 24–37)
APTT: 95 s — AB (ref 24–37)

## 2015-07-27 LAB — BASIC METABOLIC PANEL
ANION GAP: 17 — AB (ref 5–15)
BUN: 80 mg/dL — ABNORMAL HIGH (ref 6–20)
CO2: 19 mmol/L — AB (ref 22–32)
Calcium: 8.1 mg/dL — ABNORMAL LOW (ref 8.9–10.3)
Chloride: 91 mmol/L — ABNORMAL LOW (ref 101–111)
Creatinine, Ser: 4.03 mg/dL — ABNORMAL HIGH (ref 0.61–1.24)
GFR, EST AFRICAN AMERICAN: 15 mL/min — AB (ref >60)
GFR, EST NON AFRICAN AMERICAN: 13 mL/min — AB (ref >60)
Glucose, Bld: 236 mg/dL — ABNORMAL HIGH (ref 65–99)
POTASSIUM: 4.5 mmol/L (ref 3.5–5.1)
Sodium: 127 mmol/L — ABNORMAL LOW (ref 135–145)

## 2015-07-27 LAB — CARBOXYHEMOGLOBIN
CARBOXYHEMOGLOBIN: 1.6 % — AB (ref 0.5–1.5)
Methemoglobin: 0.7 % (ref 0.0–1.5)
O2 Saturation: 79.1 %
Total hemoglobin: 10.4 g/dL — ABNORMAL LOW (ref 13.5–18.0)

## 2015-07-27 LAB — MAGNESIUM: Magnesium: 2.3 mg/dL (ref 1.7–2.4)

## 2015-07-27 MED ORDER — SODIUM CHLORIDE 0.9 % IV SOLN
INTRAVENOUS | Status: DC
  Administered 2015-07-27 – 2015-07-28 (×3): via INTRAVENOUS

## 2015-07-27 NOTE — Progress Notes (Signed)
ANTICOAGULATION CONSULT NOTE - [place] for Heparin Indication: atrial fibrillation  Allergies  Allergen Reactions  . Heparin Other (See Comments)    Unknown reaction. HIT Panel negative in 2014.    Patient Measurements: Height: 5' 7"  (170.2 cm) Weight: 200 lb 9.9 oz (91 kg) IBW/kg (Calculated) : 66.1 Heparin Dosing Weight: 84.1 kg  Vital Signs: Temp: 97.5 F (36.4 C) (05/07 0800) Temp Source: Oral (05/07 0800) BP: 107/60 mmHg (05/07 0800)  Labs:  Recent Labs  07/25/15 0410 07/26/15 0439 07/26/15 1200 07/26/15 1616 07/27/15 07/27/15 0001 07/27/15 0425 07/27/15 0652  HGB 8.8* 10.8*  --   --   --   --  9.9*  --   HCT 27.0* 31.7*  --   --   --   --  28.2*  --   PLT 97* 200  --   --   --   --  188  --   APTT  --   --  45*  --   --  95*  --  120*  HEPARINUNFRC  --   --  >2.20*  --  >2.20*  --   --   --   CREATININE 3.69* 4.79*  --  5.10*  --   --  4.03*  4.09*  --     Estimated Creatinine Clearance: 15.2 mL/min (by C-G formula based on Cr of 4.03).   Medical History: Past Medical History  Diagnosis Date  . Gout   . Hypertension   . Old anterior wall myocardial infarction   . Thrombocytopenia, immune   . Atrial fibrillation ([place])   . Hyperlipidemia   . ICD-Med[employer]onic 11/25/2008    09/28/2007  Medtronic Sprint Quattro Secure S model 6935-65 (serial # [id number])                 Medtronic Virtuoso VR model D15VWC (serial # [id number]) single chamber ICD   . CAD (coronary artery disease)     1985 Anterior MI 1985 with LIMA to LAD [place] PTCA to RCA x2 July 2009 Cath EF 20-25%  Patent LIMA to LAD, occluded LAD, Mild to moderate disease in RCA and Circ July 2014 Lexiscan Myoview  EF 32% anterior MI, no ischemia    . History of breast cancer in male   . Ischemic cardiomyopathy   . Osteoarthritis   . Breast cancer ([place])     dx at age 80  . BRCA2 genetic carrier 2014  . CHF (congestive heart failure) ([place])   . Shortness of breath  dyspnea     Medications:  Prescriptions prior to admission  Medication Sig Dispense Refill Last Dose  . apixaban (ELIQUIS) 2.5 MG TABS tablet Take 1 tablet (2.5 mg total) by mouth 2 (two) times daily. (Patient taking differently: Take 2.5 mg by mouth 2 (two) times daily. ) 60 tablet 0 07/21/2015 at Unknown time  . atorvastatin (LIPITOR) 40 MG tablet Take 40 mg by mouth daily.   07/21/2015 at Unknown time  . ferrous sulfate 325 (65 FE) MG tablet Take 325 mg by mouth daily with breakfast.   07/21/2015 at Unknown time  . furosemide (LASIX) 80 MG tablet Take 80 mg by mouth 2 (two) times daily.   07/21/2015 at Unknown time  . metoprolol (LOPRESSOR) 50 MG tablet Take 50 mg by mouth 2 (two) times daily.     07/21/2015 at 1330  . multivitamin (THERAGRAN) per tablet Take 1 tablet by mouth daily.     07/21/2015 at  Unknown time  . potassium chloride SA (K-DUR,KLOR-CON) 20 MEQ tablet Take 20 mEq by mouth daily.     07/21/2015 at Unknown time  . terazosin (HYTRIN) 5 MG capsule Take 1 capsule (5 mg total) by mouth at bedtime.   07/21/2015 at Unknown time  . valsartan (DIOVAN) 80 MG tablet Take 40 mg by mouth daily.   07/21/2015 at Unknown time  . vitamin C (ASCORBIC ACID) 500 MG tablet Take 500 mg by mouth daily.     07/21/2015 at Unknown time    Assessment: 82 yo M admitted with low output HF. Pt has chronic afib and was taking apixaban PTA. Apixaban was initially continued during admission, but it was stopped and changed to heparin on 5/6 when the decision was made to initiate CVVHD due to acute renal failure.  HL still >2.20 today, so we will continue to use aPTTs to monitor until apixaban has washed out.  aPTT elevated today at 120 seconds after being therapeutic x2 on 1150 units/hr.  Slight drop in Hgb 10.8 > 9.9 and platelets 200>188, but no bleeding noted. Likely reflective of consumption in CVVHD circuit.  Spoke with nurse regarding patient's documentation of heparin allergy. Nurse reports no signs of allergic  reaction (no swelling, no breathing issues). Patient had a HIT panel drawn in 2014, which was negative. My guess is that the allergy was added pending the panel result and was never removed.  Goal of Therapy:  aPTT 66-102 seconds Monitor platelets by anticoagulation protocol: Yes   Plan:  --reduce heparin infusion to 950 units/hr  --check aPTT in 8 hr --Daily aPTT/HL/CBC --monitor for bleeding  --f/u resumption of apixaban when able; patient will need education --Remove heparin allergy tomorrow if patient continues to have no issues while on the drip  Governor Specking, PharmD Clinical Pharmacy Resident Pager: [phone] 07/27/2015,9:40 AM

## 2015-07-27 NOTE — Progress Notes (Signed)
Assessment/Plan: 1.AKI, hemodynamically mediated- AoCKD baseline creatinine 1.8. 2. Hypertension/volume - Hypotensive with pressor dependence likely multi-factorial from CHF and sepsis.   Plan: Will continue with CRRT and see what  Happens.  Subjective: Interval History: appears more stable  Objective: Vital signs in last 24 hours: Temp:  [97.1 F (36.2 C)-98 F (36.7 C)] 97.6 F (36.4 C) (05/07 1200) Resp:  [16-26] 17 (05/07 1200) BP: (95-115)/(45-73) 98/50 mmHg (05/07 1200) SpO2:  [94 %-99 %] 97 % (05/07 1200) Weight:  [91 kg (200 lb 9.9 oz)] 91 kg (200 lb 9.9 oz) (05/07 0444) Weight change: -0.5 kg (-1 lb 1.6 oz)  Intake/Output from previous day: 05/06 0701 - 05/07 0700 In: 3034.8 [P.O.:510; I.V.:2058.8; IV Piggyback:466] Out: 2011  Intake/Output this shift: Total I/O In: 755.3 [P.O.:180; I.V.:525.3; IV Piggyback:50] Out: 1218 [Other:1218]  General appearance: alert and cooperative Resp: diminished breath sounds bilaterally Cardio: regular rate and rhythm, S1, S2 normal, no murmur, click, rub or gallop Extremities: edema 1-2+  Lab Results:  Recent Labs  07/26/15 0439 07/27/15 0425  WBC 18.8* 11.6*  HGB 10.8* 9.9*  HCT 31.7* 28.2*  PLT 200 188   BMET:  Recent Labs  07/26/15 1616 07/27/15 0425  NA 125* 127*  127*  K 5.0 4.5  4.5  CL 87* 91*  91*  CO2 18* 19*  19*  GLUCOSE 231* 236*  238*  BUN 103* 80*  80*  CREATININE 5.10* 4.03*  4.09*  CALCIUM 8.3* 8.1*  8.1*   No results for input(s): PTH in the last 72 hours. Iron Studies: No results for input(s): IRON, TIBC, TRANSFERRIN, FERRITIN in the last 72 hours. Studies/Results: Dg Chest Port 1 View  07/26/2015  CLINICAL DATA:  Central line placement. Patient with sepsis and renal failure. EXAM: PORTABLE CHEST 1 VIEW COMPARISON:  07/25/2015 and prior exams FINDINGS: Cardiomegaly, CABG changes, left ICD and right PICC line with tip overlying the lower SVC again noted. A right IJ central venous  catheter is now identified with tip overlying the mid SVC. There is no evidence of pneumothorax. Pulmonary vascular congestion, mild interstitial edema, right pleural effusion and right basilar atelectasis again identified. IMPRESSION: Right IJ central venous catheter placement with tip overlying the mid SVC - no evidence of pneumothorax. No other significant changes. Electronically Signed   By: Margarette [place] M.D.   On: 07/26/2015 14:01    Scheduled: . antiseptic oral rinse  7 mL Mouth Rinse BID  . atorvastatin  40 mg Oral q1800  . ferrous sulfate  325 mg Oral Q breakfast  . insulin aspart  0-15 Units Subcutaneous TID WC  . insulin aspart  0-5 Units Subcutaneous QHS  . piperacillin-tazobactam (ZOSYN)  IV  2.25 g Intravenous Q6H  . predniSONE  40 mg Oral Q breakfast  . sodium chloride flush  10-40 mL Intracatheter Q12H  . sodium chloride flush  3 mL Intravenous Q12H  . vancomycin  1,000 mg Intravenous Q24H    LOS: 6 days   Jayline Kilburg C 07/27/2015,1:42 PM

## 2015-07-27 NOTE — Progress Notes (Signed)
ANTICOAGULATION CONSULT NOTE - Follow-up Consult  Pharmacy Consult for apixaban --> Heparin Indication: atrial fibrillation  Allergies  Allergen Reactions  . Heparin Other (See Comments)    Unknown reaction. HIT Panel negative in 2014.    Patient Measurements: Height: 5\' 7"  (170.2 cm) Weight: 201 lb 11.5 oz (91.5 kg) IBW/kg (Calculated) : 66.1 Heparin Dosing Weight: 84.1 kg  Vital Signs: Temp: 97.5 F (36.4 C) (05/07 0003) Temp Source: Oral (05/07 0003) BP: 102/60 mmHg (05/07 0030)  Labs:  Recent Labs  07/24/15 1129  07/25/15 0410 07/26/15 0439 07/26/15 1200 07/26/15 1616 07/27/15 07/27/15 0001  HGB 8.1*  --  8.8* 10.8*  --   --   --   --   HCT 24.6*  --  27.0* 31.7*  --   --   --   --   PLT 75*  --  97* 200  --   --   --   --   APTT  --   --   --   --  45*  --   --  95*  HEPARINUNFRC  --   --   --   --  >2.20*  --  >2.20*  --   CREATININE  --   < > 3.69* 4.79*  --  5.10*  --   --   < > = values in this interval not displayed.  Estimated Creatinine Clearance: 12.1 mL/min (by C-G formula based on Cr of 5.1).   Assessment: 82 yo M admitted with low output HF. Pt has chronic afib and was taking apixaban PTA. He is now to be initiated on CVVHD due to acute renal failure, so apixaban is being discontinued and switched to heparin. Heparin level remains elevated >2.2 due to apixaban. Utilizing aPTT to monitor heparin. APTT therapeutic on 1150 units/hr. No bleeding noted.  Goal of Therapy:  aPTT 66-102 seconds Monitor platelets by anticoagulation protocol: Yes   Plan:  Continue heparin infusion at 1150 units/hr F/u aPTT in 6 hours to confirm therapeutic Daily aPTT/HL/CBC  Sherlon Handing, PharmD, BCPS Clinical pharmacist, pager [phone] 07/27/2015,12:55 AM

## 2015-07-27 NOTE — Progress Notes (Signed)
Advanced Heart Failure Rounding Note   Subjective:    [name] is an 82 year old male with h/o CAD s/p anterior MI in 1985 followed by CABG and PCI of RCA, chronic systolic HF EF 123456, chronic AF and CKD followed by Dr. Wynonia Lawman. We have been asked to consult for possible low output HF.   Over past few weeks has been struggling with progressive volume overload, fatigue and DOE. Saw Dr. Wynonia Lawman in the office and was refractory to titration of oral diuretics so was admitted for IV diuresis. Did not respond well to IV lasix. Developed renal failure.  Moved to ICU due to hypotension. Urine with many bacteria. Developed urosepsis. Milrinone stopped norepi started.   Started on CVVHD last night. Looks mugh better. On norepi 23 (down from 38) and vasopressin. SBP 90-110. Denies SOB. On heparin for AF and CVVHD. Remains anuric. HR down a bit.    UA- many bacteria. No growth  Blood Cultures- NGTD   Objective:   Weight Range:  Vital Signs:   Temp:  [97.1 F (36.2 C)-98 F (36.7 C)] 97.5 F (36.4 C) (05/07 0800) Resp:  [16-26] 20 (05/07 1100) BP: (95-115)/(45-73) 105/61 mmHg (05/07 1100) SpO2:  [94 %-99 %] 97 % (05/07 1100) Weight:  [91 kg (200 lb 9.9 oz)] 91 kg (200 lb 9.9 oz) (05/07 0444) Last BM Date: 07/25/15  Weight change: Filed Weights   07/25/15 0500 07/26/15 0500 07/27/15 0444  Weight: 91.8 kg (202 lb 6.1 oz) 91.5 kg (201 lb 11.5 oz) 91 kg (200 lb 9.9 oz)    Intake/Output:   Intake/Output Summary (Last 24 hours) at 07/27/15 1114 Last data filed at 07/27/15 1100  Gross per 24 hour  Intake 3147.31 ml  Output   2853 ml  Net 294.31 ml     Physical Exam: CVP 10 General: Elderly male. Lying in bed NAD HEENT: normal Neck: supple. RIJ trialysis cath. JVP 10  Carotids 2+ bilat; no bruits. No lymphadenopathy or thryomegaly appreciated. Cor: PMI laterally displaced. IRR  Lungs:Clear anteriorly. On 2 liters  Abdomen: soft, nontender, NT. No hepatosplenomegaly. No bruits  or masses. Good bowel sounds. Extremities: no cyanosis, clubbing, rash, 1-2+ edema Neuro: alert & orientedx3, cranial nerves grossly intact. moves all 4 extremities w/o difficulty. Affect pleasant  Telemetry: AF 90-105  Labs: Basic Metabolic Panel:  Recent Labs Lab 07/24/15 1324 07/25/15 0410 07/26/15 0439 07/26/15 1616 07/27/15 0425  NA 131* 128* 127* 125* 127*  127*  K 3.5 4.1 4.7 5.0 4.5  4.5  CL 94* 92* 88* 87* 91*  91*  CO2 22 22 21* 18* 19*  19*  GLUCOSE 338* 313* 221* 231* 236*  238*  BUN 70* 80* 100* 103* 80*  80*  CREATININE 3.05* 3.69* 4.79* 5.10* 4.03*  4.09*  CALCIUM 8.0* 8.1* 8.2* 8.3* 8.1*  8.1*  MG  --   --   --   --  2.3  PHOS  --   --   --  6.6* 4.7*    Liver Function Tests:  Recent Labs Lab 07/21/15 1838 07/26/15 1616 07/27/15 0425  AST 33  --   --   ALT 25  --   --   ALKPHOS 132*  --   --   BILITOT 2.0*  --   --   PROT 7.7  --   --   ALBUMIN 3.5 2.1* 2.1*   No results for input(s): LIPASE, AMYLASE in the last 168 hours. No results for input(s): AMMONIA in the  last 168 hours.  CBC:  Recent Labs Lab 07/21/15 1838 07/24/15 1129 07/25/15 0410 07/26/15 0439 07/27/15 0425  WBC 9.1 11.8* 15.0* 18.8* 11.6*  NEUTROABS 7.5 10.5* 14.1*  --   --   HGB 10.1* 8.1* 8.8* 10.8* 9.9*  HCT 31.3* 24.6* 27.0* 31.7* 28.2*  MCV 103.6* 101.2* 101.9* 100.0 94.6  PLT 93* 75* 97* 200 188    Cardiac Enzymes: No results for input(s): CKTOTAL, CKMB, CKMBINDEX, TROPONINI in the last 168 hours.  BNP: BNP (last 3 results)  Recent Labs  07/21/15 1838  BNP 1660.5*    ProBNP (last 3 results) No results for input(s): PROBNP in the last 8760 hours.    Other results:  Imaging: Dg Chest Port 1 View  07/26/2015  CLINICAL DATA:  Central line placement. Patient with sepsis and renal failure. EXAM: PORTABLE CHEST 1 VIEW COMPARISON:  07/25/2015 and prior exams FINDINGS: Cardiomegaly, CABG changes, left ICD and right PICC line with tip overlying the  lower SVC again noted. A right IJ central venous catheter is now identified with tip overlying the mid SVC. There is no evidence of pneumothorax. Pulmonary vascular congestion, mild interstitial edema, right pleural effusion and right basilar atelectasis again identified. IMPRESSION: Right IJ central venous catheter placement with tip overlying the mid SVC - no evidence of pneumothorax. No other significant changes. Electronically Signed   By: Margarette Canada M.D.   On: 07/26/2015 14:01     Medications:     Scheduled Medications: . antiseptic oral rinse  7 mL Mouth Rinse BID  . atorvastatin  40 mg Oral q1800  . ferrous sulfate  325 mg Oral Q breakfast  . insulin aspart  0-15 Units Subcutaneous TID WC  . insulin aspart  0-5 Units Subcutaneous QHS  . piperacillin-tazobactam (ZOSYN)  IV  2.25 g Intravenous Q6H  . predniSONE  40 mg Oral Q breakfast  . sodium chloride flush  10-40 mL Intracatheter Q12H  . sodium chloride flush  3 mL Intravenous Q12H  . vancomycin  1,000 mg Intravenous Q24H    Infusions: . sodium chloride 10 mL/hr at 07/27/15 0834  . amiodarone 60 mg/hr (07/27/15 1004)  . heparin 950 Units/hr (07/27/15 1006)  . norepinephrine (LEVOPHED) Adult infusion 23 mcg/min (07/27/15 1000)  . dialysis replacement fluid (prismasate) 200 mL/hr at 07/26/15 1843  . dialysis replacement fluid (prismasate) 400 mL/hr at 07/27/15 0742  . dialysate (PRISMASATE) 1,000 mL/hr at 07/27/15 1008  . vasopressin (PITRESSIN) infusion - *FOR SHOCK* 0.03 Units/min (07/27/15 1004)    PRN Medications: sodium chloride, acetaminophen, alteplase, fentaNYL (SUBLIMAZE) injection, guaiFENesin, heparin, heparin, heparin, levalbuterol, ondansetron (ZOFRAN) IV, sodium chloride flush, sodium chloride flush, traMADol   Assessment:   1.Urosepsis  2. Acute on chronic systolic HF  2. Chronic AF now with RVR 3. Ischemic CM EF 20-25% 4. Acute on chronic renal failure, stage IV -> CVVHD started 5/6. CAD s/p anterior MI  followed by CABG and PCI RCA 6. Acute respiratory failure 7. Hypotension 8. Hypokalemia 9. Acute Gout Flare    Plan/Discussion:    Has severe sepsis with worsening renal failure. Now anuric on CVVHD. Remains on dual pressors but improving. Will continue to wean norepi as tolerated. Can pull Foley and do daily bladder scans. Discussed with family about time course of possible renal recovery. If no renal recover in 1-2 weeks will need Hospice. He is not candidate for long-term HD with severe HF.   Cultures negative. Continue vancomycin and zosyn. Cultures negative so far  Remains on  amio for rate control of chronic AF. HR improved. Off apixaban for now and on heparin.   HF stable. Co-ox ok. Volume removal with CVVHD.   The patient is critically ill with multiple organ systems failure and requires high complexity decision making for assessment and support, frequent evaluation and titration of therapies, application of advanced monitoring technologies and extensive interpretation of multiple databases.   Critical Care Time devoted to patient care services described in this note is 35 Minutes.  Presleigh Feldstein,MD 11:14 AM Advanced Heart Failure Team Pager [phone] (M-F; 7a - 4p)  Please contact [facility] for night-coverage after hours (4p -7a ) and weekends on [email or web address]

## 2015-07-27 NOTE — Progress Notes (Signed)
ANTICOAGULATION CONSULT NOTE - [place] for Heparin Indication: atrial fibrillation  Allergies  Allergen Reactions  . Heparin Other (See Comments)    Unknown reaction. HIT Panel negative in 2014.    Patient Measurements: Height: 5' 7"  (170.2 cm) Weight: 200 lb 9.9 oz (91 kg) IBW/kg (Calculated) : 66.1 Heparin Dosing Weight: 84.1 kg  Vital Signs: Temp: 97.5 F (36.4 C) (05/07 1600) Temp Source: Oral (05/07 1600) BP: 100/64 mmHg (05/07 1600)  Labs:  Recent Labs  07/25/15 0410 07/26/15 0439  07/26/15 1200 07/26/15 1616 07/27/15 07/27/15 0001 07/27/15 0425 07/27/15 0652 07/27/15 1618  HGB 8.8* 10.8*  --   --   --   --   --  9.9*  --   --   HCT 27.0* 31.7*  --   --   --   --   --  28.2*  --   --   PLT 97* 200  --   --   --   --   --  188  --   --   APTT  --   --   < > 45*  --   --  95*  --  120* 90*  HEPARINUNFRC  --   --   --  >2.20*  --  >2.20*  --   --   --   --   CREATININE 3.69* 4.79*  --   --  5.10*  --   --  4.03*  4.09*  --  3.21*  < > = values in this interval not displayed.  Estimated Creatinine Clearance: 19.1 mL/min (by C-G formula based on Cr of 3.21).   Medical History: Past Medical History  Diagnosis Date  . Gout   . Hypertension   . Old anterior wall myocardial infarction   . Thrombocytopenia, immune   . Atrial fibrillation ([place])   . Hyperlipidemia   . ICD-Medtronic 11/25/2008    09/28/2007  Medtronic Sprint Quattro Secure S model 6935-65 (serial # [id number])                 Medtronic Virtuoso VR model D15VWC (serial # [id number]) single chamber ICD   . CAD (coronary artery disease)     1985 Anterior MI 1985 with LIMA to LAD [place] PTCA to RCA x2 July 2009 Cath EF 20-25%  Patent LIMA to LAD, occluded LAD, Mild to moderate disease in RCA and Circ July 2014 Lexiscan Myoview  EF 32% anterior MI, no ischemia    . History of breast cancer in male   . Ischemic cardiomyopathy   . Osteoarthritis   . Breast cancer  ([place])     dx at age 27  . BRCA2 genetic carrier 2014  . CHF (congestive heart failure) ([place])   . Shortness of breath dyspnea     Medications:  Prescriptions prior to admission  Medication Sig Dispense Refill Last Dose  . apixaban (ELIQUIS) 2.5 MG TABS tablet Take 1 tablet (2.5 mg total) by mouth 2 (two) times daily. (Patient taking differently: Take 2.5 mg by mouth 2 (two) times daily. ) 60 tablet 0 07/21/2015 at Unknown time  . atorvastatin (LIPITOR) 40 MG tablet Take 40 mg by mouth daily.   07/21/2015 at Unknown time  . ferrous sulfate 325 (65 FE) MG tablet Take 325 mg by mouth daily with breakfast.   07/21/2015 at Unknown time  . furosemide (LASIX) 80 MG tablet Take 80 mg by mouth 2 (two) times daily.   07/21/2015 at  Unknown time  . metoprolol (LOPRESSOR) 50 MG tablet Take 50 mg by mouth 2 (two) times daily.     07/21/2015 at 1330  . multivitamin (THERAGRAN) per tablet Take 1 tablet by mouth daily.     07/21/2015 at Unknown time  . potassium chloride SA (K-DUR,KLOR-CON) 20 MEQ tablet Take 20 mEq by mouth daily.     07/21/2015 at Unknown time  . terazosin (HYTRIN) 5 MG capsule Take 1 capsule (5 mg total) by mouth at bedtime.   07/21/2015 at Unknown time  . valsartan (DIOVAN) 80 MG tablet Take 40 mg by mouth daily.   07/21/2015 at Unknown time  . vitamin C (ASCORBIC ACID) 500 MG tablet Take 500 mg by mouth daily.     07/21/2015 at Unknown time    Assessment: 82 yo M admitted with low output HF. Pt has chronic afib and was taking apixaban PTA. Apixaban was initially continued during admission, but it was stopped and changed to heparin on 5/6 when the decision was made to initiate CVVHD due to acute renal failure.  HL still >2.20 today, so we will continue to use aPTTs to monitor until apixaban has washed out.  aPTT therapeutic x1 (90) after rate decrease to 950 units/h. No bleed documented.   Slight drop in Hgb 10.8 > 9.9 and platelets 200>188, but no bleeding noted. Likely reflective of consumption in  CVVHD circuit.  Spoke with nurse regarding patient's documentation of heparin allergy. Nurse reports no signs of allergic reaction (no swelling, no breathing issues). Patient had a HIT panel drawn in 2014, which was negative. My guess is that the allergy was added pending the panel result and was never removed.  Goal of Therapy:  aPTT 66-102 seconds Monitor platelets by anticoagulation protocol: Yes   Plan:  --Continue heparin infusion at 950 units/h --check aPTT in 8 hr to confirm --Daily aPTT/HL/CBC --monitor for bleeding  --f/u resumption of apixaban when able; patient will need education --Remove heparin allergy tomorrow if patient continues to have no issues while on the drip  Elicia Lamp, PharmD, [facility] Clinical Pharmacist Pager [phone] 07/27/2015 5:01 PM

## 2015-07-28 ENCOUNTER — Other Ambulatory Visit: Payer: Federal, State, Local not specified - PPO

## 2015-07-28 LAB — RENAL FUNCTION PANEL
ALBUMIN: 2.4 g/dL — AB (ref 3.5–5.0)
ALBUMIN: 2.4 g/dL — AB (ref 3.5–5.0)
ANION GAP: 12 (ref 5–15)
Anion gap: 14 (ref 5–15)
BUN: 48 mg/dL — AB (ref 6–20)
BUN: 38 mg/dL — AB (ref 6–20)
CALCIUM: 8 mg/dL — AB (ref 8.9–10.3)
CALCIUM: 8.1 mg/dL — AB (ref 8.9–10.3)
CO2: 22 mmol/L (ref 22–32)
CO2: 24 mmol/L (ref 22–32)
CREATININE: 2.5 mg/dL — AB (ref 0.61–1.24)
Chloride: 95 mmol/L — ABNORMAL LOW (ref 101–111)
Chloride: 98 mmol/L — ABNORMAL LOW (ref 101–111)
Creatinine, Ser: 2.78 mg/dL — ABNORMAL HIGH (ref 0.61–1.24)
GFR calc Af Amer: 23 mL/min — ABNORMAL LOW (ref >60)
GFR calc Af Amer: 26 mL/min — ABNORMAL LOW (ref >60)
GFR calc non Af Amer: 20 mL/min — ABNORMAL LOW (ref >60)
GFR calc non Af Amer: 22 mL/min — ABNORMAL LOW (ref >60)
GLUCOSE: 240 mg/dL — AB (ref 65–99)
GLUCOSE: 217 mg/dL — AB (ref 65–99)
PHOSPHORUS: 3.2 mg/dL (ref 2.5–4.6)
PHOSPHORUS: 2.9 mg/dL (ref 2.5–4.6)
POTASSIUM: 4.3 mmol/L (ref 3.5–5.1)
Potassium: 4.5 mmol/L (ref 3.5–5.1)
SODIUM: 131 mmol/L — AB (ref 135–145)
SODIUM: 134 mmol/L — AB (ref 135–145)

## 2015-07-28 LAB — BASIC METABOLIC PANEL
Anion gap: 14 (ref 5–15)
BUN: 48 mg/dL — AB (ref 6–20)
CO2: 22 mmol/L (ref 22–32)
CREATININE: 2.78 mg/dL — AB (ref 0.61–1.24)
Calcium: 8.1 mg/dL — ABNORMAL LOW (ref 8.9–10.3)
Chloride: 95 mmol/L — ABNORMAL LOW (ref 101–111)
GFR calc Af Amer: 23 mL/min — ABNORMAL LOW (ref >60)
GFR, EST NON AFRICAN AMERICAN: 20 mL/min — AB (ref >60)
Glucose, Bld: 241 mg/dL — ABNORMAL HIGH (ref 65–99)
Potassium: 4.3 mmol/L (ref 3.5–5.1)
SODIUM: 131 mmol/L — AB (ref 135–145)

## 2015-07-28 LAB — CBC
HCT: 25.9 % — ABNORMAL LOW (ref 39.0–52.0)
Hemoglobin: 9 g/dL — ABNORMAL LOW (ref 13.0–17.0)
MCH: 33 pg (ref 26.0–34.0)
MCHC: 34.7 g/dL (ref 30.0–36.0)
MCV: 94.9 fL (ref 78.0–100.0)
PLATELETS: 122 10*3/uL — AB (ref 150–400)
RBC: 2.73 MIL/uL — ABNORMAL LOW (ref 4.22–5.81)
RDW: 13.9 % (ref 11.5–15.5)
WBC: 10.9 10*3/uL — AB (ref 4.0–10.5)

## 2015-07-28 LAB — GLUCOSE, CAPILLARY
GLUCOSE-CAPILLARY: 186 mg/dL — AB (ref 65–99)
GLUCOSE-CAPILLARY: 220 mg/dL — AB (ref 65–99)
GLUCOSE-CAPILLARY: 168 mg/dL — AB (ref 65–99)
Glucose-Capillary: 216 mg/dL — ABNORMAL HIGH (ref 65–99)

## 2015-07-28 LAB — MAGNESIUM: Magnesium: 2.4 mg/dL (ref 1.7–2.4)

## 2015-07-28 LAB — CARBOXYHEMOGLOBIN
Carboxyhemoglobin: 1.2 % (ref 0.5–1.5)
Methemoglobin: 0.9 % (ref 0.0–1.5)
O2 Saturation: 70.9 %
Total hemoglobin: 9.1 g/dL — ABNORMAL LOW (ref 13.5–18.0)

## 2015-07-28 LAB — HEPARIN LEVEL (UNFRACTIONATED): Heparin Unfractionated: 1.96 IU/mL — ABNORMAL HIGH (ref 0.30–0.70)

## 2015-07-28 LAB — APTT: aPTT: 78 seconds — ABNORMAL HIGH (ref 24–37)

## 2015-07-28 MED ORDER — PHENOL 1.4 % MT LIQD
1.0000 | OROMUCOSAL | Status: DC | PRN
  Filled 2015-07-28: qty 177

## 2015-07-28 NOTE — Progress Notes (Signed)
Advanced Heart Failure Rounding Note   Subjective:    [name] is an 82 year old male with h/o CAD s/p anterior MI in 1985 followed by CABG and PCI of RCA, chronic systolic HF EF 123456, chronic AF and CKD followed by Dr. Wynonia Lawman. We have been asked to consult for possible low output HF.   Over past few weeks has been struggling with progressive volume overload, fatigue and DOE. Saw Dr. Wynonia Lawman in the office and was refractory to titration of oral diuretics so was admitted for IV diuresis. Did not respond well to IV lasix. Developed renal failure.  Moved to ICU due to hypotension. Urine with many bacteria. Developed urosepsis. Milrinone stopped norepi started.   Started on CVVHD 07/26/15.   Continues to improve.  States he is feeling much better today. On norepi 14 (down from peak of 38) and vasopressin 0.03. SBP 120s currently. Denies SOB. On heparin for AF and CVVHD. Remains anuric.   UA- many bacteria. Insignificant growth  Blood Cultures 07/24/15- NGTD    Objective:   Weight Range:  Vital Signs:   Temp:  [97.3 F (36.3 C)-97.6 F (36.4 C)] 97.6 F (36.4 C) (05/08 0400) Pulse Rate:  [90] 90 (05/07 1950) Resp:  [17-23] 17 (05/08 0600) BP: (92-122)/(50-68) 122/66 mmHg (05/08 0500) SpO2:  [96 %-100 %] 99 % (05/08 0600) Weight:  [195 lb 12.3 oz (88.8 kg)] 195 lb 12.3 oz (88.8 kg) (05/08 0500) Last BM Date: 07/25/15  Weight change: Filed Weights   07/26/15 0500 07/27/15 0444 07/28/15 0500  Weight: 201 lb 11.5 oz (91.5 kg) 200 lb 9.9 oz (91 kg) 195 lb 12.3 oz (88.8 kg)    Intake/Output:   Intake/Output Summary (Last 24 hours) at 07/28/15 0713 Last data filed at 07/28/15 0700  Gross per 24 hour  Intake 2688.75 ml  Output   5153 ml  Net -2464.25 ml     Physical Exam: CVP 8-9 General: Elderly male. Lying in bed NAD HEENT: normal Neck: supple. RIJ trialysis cath. JVP 8-9  Carotids 2+ bilat; no bruits. No thyromegaly or nodule noted.  Cor: PMI laterally displaced. IRR    Lungs:CTAB. On 2 liters [place] Abdomen: soft,NT, ND, no HSM. No bruits or masses. +BS  Extremities: no cyanosis, clubbing, rash, trace - 1+ edema Neuro: alert & orientedx3, cranial nerves grossly intact. moves all 4 extremities w/o difficulty. Affect pleasant  Telemetry: Reviewed personally, AF 90s-105  Labs: Basic Metabolic Panel:  Recent Labs Lab 07/26/15 0439 07/26/15 1616 07/27/15 0425 07/27/15 1618 07/28/15 0410  NA 127* 125* 127*  127* 130* 131*  131*  K 4.7 5.0 4.5  4.5 4.4 4.3  4.3  CL 88* 87* 91*  91* 92* 95*  95*  CO2 21* 18* 19*  19* 21* 22  22  GLUCOSE 221* 231* 236*  238* 231* 241*  240*  BUN 100* 103* 80*  80* 61* 48*  48*  CREATININE 4.79* 5.10* 4.03*  4.09* 3.21* 2.78*  2.78*  CALCIUM 8.2* 8.3* 8.1*  8.1* 8.1* 8.1*  8.0*  MG  --   --  2.3  --  2.4  PHOS  --  6.6* 4.7* 3.3 3.2    Liver Function Tests:  Recent Labs Lab 07/21/15 1838 07/26/15 1616 07/27/15 0425 07/27/15 1618 07/28/15 0410  AST 33  --   --   --   --   ALT 25  --   --   --   --   ALKPHOS 132*  --   --   --   --  BILITOT 2.0*  --   --   --   --   PROT 7.7  --   --   --   --   ALBUMIN 3.5 2.1* 2.1* 2.1* 2.4*   No results for input(s): LIPASE, AMYLASE in the last 168 hours. No results for input(s): AMMONIA in the last 168 hours.  CBC:  Recent Labs Lab 07/21/15 1838 07/24/15 1129 07/25/15 0410 07/26/15 0439 07/27/15 0425 07/28/15 0410  WBC 9.1 11.8* 15.0* 18.8* 11.6* 10.9*  NEUTROABS 7.5 10.5* 14.1*  --   --   --   HGB 10.1* 8.1* 8.8* 10.8* 9.9* 9.0*  HCT 31.3* 24.6* 27.0* 31.7* 28.2* 25.9*  MCV 103.6* 101.2* 101.9* 100.0 94.6 94.9  PLT 93* 75* 97* 200 188 122*    Cardiac Enzymes: No results for input(s): CKTOTAL, CKMB, CKMBINDEX, TROPONINI in the last 168 hours.  BNP: BNP (last 3 results)  Recent Labs  07/21/15 1838  BNP 1660.5*    ProBNP (last 3 results) No results for input(s): PROBNP in the last 8760 hours.    Other results:  Imaging: Dg  Chest Port 1 View  07/26/2015  CLINICAL DATA:  Central line placement. Patient with sepsis and renal failure. EXAM: PORTABLE CHEST 1 VIEW COMPARISON:  07/25/2015 and prior exams FINDINGS: Cardiomegaly, CABG changes, left ICD and right PICC line with tip overlying the lower SVC again noted. A right IJ central venous catheter is now identified with tip overlying the mid SVC. There is no evidence of pneumothorax. Pulmonary vascular congestion, mild interstitial edema, right pleural effusion and right basilar atelectasis again identified. IMPRESSION: Right IJ central venous catheter placement with tip overlying the mid SVC - no evidence of pneumothorax. No other significant changes. Electronically Signed   By: Margarette Canada M.D.   On: 07/26/2015 14:01     Medications:     Scheduled Medications: . antiseptic oral rinse  7 mL Mouth Rinse BID  . atorvastatin  40 mg Oral q1800  . ferrous sulfate  325 mg Oral Q breakfast  . insulin aspart  0-15 Units Subcutaneous TID WC  . insulin aspart  0-5 Units Subcutaneous QHS  . piperacillin-tazobactam (ZOSYN)  IV  2.25 g Intravenous Q6H  . predniSONE  40 mg Oral Q breakfast  . sodium chloride flush  10-40 mL Intracatheter Q12H  . sodium chloride flush  3 mL Intravenous Q12H  . vancomycin  1,000 mg Intravenous Q24H    Infusions: . sodium chloride 10 mL/hr at 07/28/15 0643  . amiodarone 60 mg/hr (07/28/15 0525)  . heparin 950 Units/hr (07/28/15 0525)  . norepinephrine (LEVOPHED) Adult infusion 16 mcg/min (07/28/15 0600)  . dialysis replacement fluid (prismasate) 200 mL/hr at 07/27/15 2023  . dialysis replacement fluid (prismasate) 400 mL/hr at 07/27/15 2020  . dialysate (PRISMASATE) 1,000 mL/hr at 07/28/15 0100  . vasopressin (PITRESSIN) infusion - *FOR SHOCK* 0.03 Units/min (07/27/15 1004)    PRN Medications: sodium chloride, acetaminophen, alteplase, fentaNYL (SUBLIMAZE) injection, guaiFENesin, heparin, heparin, heparin, levalbuterol, ondansetron (ZOFRAN)  IV, phenol, sodium chloride flush, sodium chloride flush, traMADol   Assessment:   1.Urosepsis  2. Acute on chronic systolic HF  2. Chronic AF now with RVR 3. Ischemic CM EF 20-25% 4. Acute on chronic renal failure, stage IV -> CVVHD started 5/6. CAD s/p anterior MI followed by CABG and PCI RCA 6. Acute respiratory failure 7. Hypotension 8. Hypokalemia 9. Acute Gout Flare    Plan/Discussion:    Has severe sepsis with worsening renal failure. Remains anuric on  CVVHD. Remains on dual pressors but improving. Will continue to wean norepi as tolerated, currently at 14.  RN had weaned to 4, but BP dropped into 80s so had to come back up.    On vancomycin and zosyn. Cultures negative so far  Remains on amio for rate control of chronic AF. HR improved, currently stable in 90s. Off apixaban for now and on heparin.   HF stable. Co-ox 70.9%. Volume removal ongoing with CVVHD.   Prognosis has been made clear to family. If no renal recover in 1-2 weeks will need Hospice. He is not candidate for long-term HD with severe HF.   Shirley Friar, PA-C 7:13 AM Advanced Heart Failure Team Pager [phone] (M-F; 7a - 4p)  Please contact [facility] for night-coverage after hours (4p -7a ) and weekends on [email or web address]  Patient seen and examined with Oda Kilts, PA-C. We discussed all aspects of the encounter. I agree with the assessment and plan as stated above.   He remains anuric. Ongoing support with  CVVHD and dual pressors. AF rate now controlled on iv amio. Co-ox is ok.   Main issue remains whether or not his renal function will recover. Discussed time course of possible recovery and prognosis again with him and his family.   Continue abx for urosepsis.   The patient is critically ill with multiple organ systems failure and requires high complexity decision making for assessment and support, frequent evaluation and titration of therapies, application of advanced monitoring  technologies and extensive interpretation of multiple databases.   Critical Care Time devoted to patient care services described in this note is 35 Minutes.   Marni Franzoni,MD 9:24 AM

## 2015-07-28 NOTE — Progress Notes (Signed)
ANTICOAGULATION CONSULT NOTE - [place] for Heparin Indication: atrial fibrillation  Allergies  Allergen Reactions  . Heparin Other (See Comments)    Unknown reaction. HIT Panel negative in 2014.    Patient Measurements: Height: 5\' 7"  (170.2 cm) Weight: 200 lb 9.9 oz (91 kg) IBW/kg (Calculated) : 66.1 Heparin Dosing Weight: 84.1 kg  Vital Signs: Temp: 97.3 F (36.3 C) (05/07 1950) Temp Source: Oral (05/07 1950) BP: 105/59 mmHg (05/08 0000) Pulse Rate: 90 (05/07 1950)  Labs:  Recent Labs  07/25/15 0410 07/26/15 0439 07/26/15 1200 07/26/15 1616 07/27/15  07/27/15 0425 07/27/15 0652 07/27/15 1618 07/28/15  HGB 8.8* 10.8*  --   --   --   --  9.9*  --   --   --   HCT 27.0* 31.7*  --   --   --   --  28.2*  --   --   --   PLT 97* 200  --   --   --   --  188  --   --   --   APTT  --   --  45*  --   --   < >  --  120* 90* 78*  HEPARINUNFRC  --   --  >2.20*  --  >2.20*  --   --   --   --   --   CREATININE 3.69* 4.79*  --  5.10*  --   --  4.03*  4.09*  --  3.21*  --   < > = values in this interval not displayed.  Estimated Creatinine Clearance: 19.1 mL/min (by C-G formula based on Cr of 3.21).   Assessment: 82 yo M admitted with low output HF. Pt has chronic afib and was taking apixaban PTA. Apixaban was initially continued during admission, but it was stopped and changed to heparin on 5/6 when the decision was made to initiate CVVHD due to acute renal failure.  HL still >2.20 today, so we will continue to use aPTTs to monitor until apixaban has washed out.  aPTT remains therapeutic on 950 units/h. No bleeding noted. Goal of Therapy:  aPTT 66-102 seconds Monitor platelets by anticoagulation protocol: Yes   Plan:  --Continue heparin infusion at 950 units/h --Daily aPTT/HL/CBC --f/u resumption of apixaban when able; patient will need education --Remove heparin allergy tomorrow if patient continues to have no issues while on the drip  Sherlon Handing, PharmD, BCPS Clinical pharmacist, pager [phone] 07/28/2015 12:47 AM

## 2015-07-28 NOTE — Progress Notes (Signed)
Inpatient Diabetes Program Recommendations  AACE/ADA: New Consensus Statement on Inpatient Glycemic Control (2015)  Target Ranges:  Prepandial:   less than 140 mg/dL      Peak postprandial:   less than 180 mg/dL (1-2 hours)      Critically ill patients:  140 - 180 mg/dL   Review of Glycemic Control  Inpatient Diabetes Program Recommendations:  Insulin - Basal: consider adding low dose Levemir 10 units  HgbA1C: order to assess prehospital glucose control Thank you  Raoul Pitch BSN, RN,CDE Inpatient Diabetes Coordinator [phone] (team pager)

## 2015-07-28 NOTE — Progress Notes (Signed)
ANTICOAGULATION CONSULT NOTE - [place] for Heparin Indication: atrial fibrillation  Allergies  Allergen Reactions  . Heparin Other (See Comments)    Unknown reaction. HIT Panel negative in 2014.    Patient Measurements: Height: 5\' 7"  (170.2 cm) Weight: 195 lb 12.3 oz (88.8 kg) IBW/kg (Calculated) : 66.1 Heparin Dosing Weight: 84.1 kg  Vital Signs: Temp: 97.5 F (36.4 C) (05/08 0800) Temp Source: Oral (05/08 0800) BP: 109/60 mmHg (05/08 0900) Pulse Rate: 85 (05/08 0900)  Labs:  Recent Labs  07/26/15 0439 07/26/15 1200  07/27/15  07/27/15 0425 07/27/15 0652 07/27/15 1618 07/28/15 07/28/15 0410  HGB 10.8*  --   --   --   --  9.9*  --   --   --  9.0*  HCT 31.7*  --   --   --   --  28.2*  --   --   --  25.9*  PLT 200  --   --   --   --  188  --   --   --  122*  APTT  --  45*  --   --   < >  --  120* 90* 78*  --   HEPARINUNFRC  --  >2.20*  --  >2.20*  --   --   --   --   --  1.96*  CREATININE 4.79*  --   < >  --   --  4.03*  4.09*  --  3.21*  --  2.78*  2.78*  < > = values in this interval not displayed.  Estimated Creatinine Clearance: 21.8 mL/min (by C-G formula based on Cr of 2.78).   Assessment: 82 yo M admitted with low output HF. Pt has chronic afib and was taking apixaban PTA. Apixaban was initially continued during admission, but it was stopped and changed to heparin on 5/6 when the decision was made to initiate CVVHD due to acute renal failure.  HL still elevated 1.96 today, so we will continue to use aPTTs to monitor until apixaban has washed out.  aPTT remains therapeutic 78sec  on 950 units/h. No bleeding noted.  Goal of Therapy:  aPTT 66-102 seconds Monitor platelets by anticoagulation protocol: Yes   Plan:  --Continue heparin infusion at 950 units/h --Daily aPTT/HL/CBC --f/u resumption of apixaban when able; patient will need education  Bonnita Nasuti Pharm.D. CPP, BCPS Clinical Pharmacist [phone] 07/28/2015 10:48  AM

## 2015-07-28 NOTE — Progress Notes (Signed)
[facility] Rounding Note  Subjective/interval history:  Remains anuric, on CRRT (since 5/6) Weight down close to 2 kg Remains on norepi and vasopressin  Last CVP 14 Feels OK - no urge to void though Getting off 75-100 ml/hour  Objective: Vital signs in last 24 hours: Temp:  [97.3 F (36.3 C)-97.7 F (36.5 C)] 97.7 F (36.5 C) (05/08 1200) Pulse Rate:  [85-94] 93 (05/08 1300) Resp:  [16-21] 16 (05/08 1300) BP: (89-123)/(50-74) 104/64 mmHg (05/08 1300) SpO2:  [95 %-100 %] 100 % (05/08 1300) Weight:  [88.8 kg (195 lb 12.3 oz)] 88.8 kg (195 lb 12.3 oz) (05/08 0500) Weight change: -2.2 kg (-4 lb 13.6 oz)  Intake/Output from previous day: 05/07 0701 - 05/08 0700 In: 2688.8 [P.O.:450; I.V.:1838.8; IV Piggyback:400] Out: 5153  Intake/Output this shift: Total I/O In: 610.1 [P.O.:100; I.V.:460.1; IV Piggyback:50] Out: 1188 [Other:1188]  Physical examination General appearance: alert and cooperative Son in visiting Resp: diminished breath sounds bilaterally Cardio: Irregular [id number] no murmur, click, rub or gallop Extremities: edema 1-2+ LE's R IJ cath (HD) (5/6)  Lab Results:  Recent Labs  07/27/15 0425 07/28/15 0410  WBC 11.6* 10.9*  HGB 9.9* 9.0*  HCT 28.2* 25.9*  PLT 188 122*     Recent Labs  07/27/15 1618 07/28/15 0410  NA 130* 131*  131*  K 4.4 4.3  4.3  CL 92* 95*  95*  CO2 21* 22  22  GLUCOSE 231* 241*  240*  BUN 61* 48*  48*  CREATININE 3.21* 2.78*  2.78*  CALCIUM 8.1* 8.1*  8.0*   Medications  Scheduled: . antiseptic oral rinse  7 mL Mouth Rinse BID  . atorvastatin  40 mg Oral q1800  . ferrous sulfate  325 mg Oral Q breakfast  . insulin aspart  0-15 Units Subcutaneous TID WC  . insulin aspart  0-5 Units Subcutaneous QHS  . piperacillin-tazobactam (ZOSYN)  IV  2.25 g Intravenous Q6H  . sodium chloride flush  10-40 mL Intracatheter Q12H  . sodium chloride flush  3 mL Intravenous Q12H  . vancomycin  1,000 mg Intravenous Q24H   . sodium  chloride 10 mL/hr at 07/28/15 0852  . amiodarone 60 mg/hr (07/28/15 1050)  . heparin 950 Units/hr (07/28/15 0800)  . norepinephrine (LEVOPHED) Adult infusion 14 mcg/min (07/28/15 0800)  . dialysis replacement fluid (prismasate) 200 mL/hr at 07/27/15 2023  . dialysis replacement fluid (prismasate) 400 mL/hr at 07/28/15 0901  . dialysate (PRISMASATE) 1,000 mL/hr at 07/28/15 1120  . vasopressin (PITRESSIN) infusion - *FOR SHOCK* 0.03 Units/min (07/28/15 1231)    Assessment/Plan: 1. AKI, hemodynamically mediated- AoCKD baseline creatinine 1.8.No evidence for recovery at this time. CRRT (5/6). Net neg 75-100. All 4K fluids. K and phos OK so far,  2. Shock  - Hypotensive with pressor dependence likely multi-factorial from CHF and sepsis.  3. Sepsis - all cultures negative. On zosyn/vanco.   4. AF with RVR - IV amio 5. Prognosis - would seem grim. Continuing CRRT for now. Not felt to be a candidate for long term dialysis and I am told family is aware.  Jamal Maes, MD [facility] [phone] Pager 07/28/2015, 2:13 PM

## 2015-07-29 ENCOUNTER — Inpatient Hospital Stay (HOSPITAL_COMMUNITY): Payer: Medicare Other

## 2015-07-29 DIAGNOSIS — Z992 Dependence on renal dialysis: Secondary | ICD-10-CM | POA: Insufficient documentation

## 2015-07-29 DIAGNOSIS — N186 End stage renal disease: Secondary | ICD-10-CM | POA: Insufficient documentation

## 2015-07-29 DIAGNOSIS — N179 Acute kidney failure, unspecified: Secondary | ICD-10-CM | POA: Insufficient documentation

## 2015-07-29 DIAGNOSIS — N189 Chronic kidney disease, unspecified: Secondary | ICD-10-CM

## 2015-07-29 LAB — CBC
HEMATOCRIT: 27.5 % — AB (ref 39.0–52.0)
HEMOGLOBIN: 9.4 g/dL — AB (ref 13.0–17.0)
MCH: 32.6 pg (ref 26.0–34.0)
MCHC: 34.2 g/dL (ref 30.0–36.0)
MCV: 95.5 fL (ref 78.0–100.0)
Platelets: 138 10*3/uL — ABNORMAL LOW (ref 150–400)
RBC: 2.88 MIL/uL — AB (ref 4.22–5.81)
RDW: 14.5 % (ref 11.5–15.5)
WBC: 16.5 10*3/uL — AB (ref 4.0–10.5)

## 2015-07-29 LAB — MAGNESIUM: Magnesium: 2.5 mg/dL — ABNORMAL HIGH (ref 1.7–2.4)

## 2015-07-29 LAB — RENAL FUNCTION PANEL
ALBUMIN: 2.3 g/dL — AB (ref 3.5–5.0)
ALBUMIN: 2.5 g/dL — AB (ref 3.5–5.0)
ANION GAP: 15 (ref 5–15)
Anion gap: 12 (ref 5–15)
BUN: 34 mg/dL — ABNORMAL HIGH (ref 6–20)
BUN: 34 mg/dL — AB (ref 6–20)
CALCIUM: 7.9 mg/dL — AB (ref 8.9–10.3)
CHLORIDE: 99 mmol/L — AB (ref 101–111)
CO2: 21 mmol/L — ABNORMAL LOW (ref 22–32)
CO2: 24 mmol/L (ref 22–32)
CREATININE: 2.27 mg/dL — AB (ref 0.61–1.24)
Calcium: 8.3 mg/dL — ABNORMAL LOW (ref 8.9–10.3)
Chloride: 97 mmol/L — ABNORMAL LOW (ref 101–111)
Creatinine, Ser: 2.16 mg/dL — ABNORMAL HIGH (ref 0.61–1.24)
GFR calc Af Amer: 29 mL/min — ABNORMAL LOW (ref >60)
GFR, EST AFRICAN AMERICAN: 31 mL/min — AB (ref >60)
GFR, EST NON AFRICAN AMERICAN: 27 mL/min — AB (ref >60)
GFR, EST NON AFRICAN AMERICAN: 25 mL/min — AB (ref >60)
GLUCOSE: 227 mg/dL — AB (ref 65–99)
Glucose, Bld: 182 mg/dL — ABNORMAL HIGH (ref 65–99)
PHOSPHORUS: 2.6 mg/dL (ref 2.5–4.6)
Phosphorus: 2.2 mg/dL — ABNORMAL LOW (ref 2.5–4.6)
Potassium: 4.1 mmol/L (ref 3.5–5.1)
Potassium: 4 mmol/L (ref 3.5–5.1)
SODIUM: 133 mmol/L — AB (ref 135–145)
Sodium: 135 mmol/L (ref 135–145)

## 2015-07-29 LAB — BASIC METABOLIC PANEL
Anion gap: 15 (ref 5–15)
BUN: 36 mg/dL — AB (ref 6–20)
CHLORIDE: 95 mmol/L — AB (ref 101–111)
CO2: 22 mmol/L (ref 22–32)
CREATININE: 2.23 mg/dL — AB (ref 0.61–1.24)
Calcium: 8.3 mg/dL — ABNORMAL LOW (ref 8.9–10.3)
GFR calc Af Amer: 30 mL/min — ABNORMAL LOW (ref >60)
GFR calc non Af Amer: 26 mL/min — ABNORMAL LOW (ref >60)
Glucose, Bld: 228 mg/dL — ABNORMAL HIGH (ref 65–99)
Potassium: 4.3 mmol/L (ref 3.5–5.1)
Sodium: 132 mmol/L — ABNORMAL LOW (ref 135–145)

## 2015-07-29 LAB — CARBOXYHEMOGLOBIN
CARBOXYHEMOGLOBIN: 1 % (ref 0.5–1.5)
Methemoglobin: 0.7 % (ref 0.0–1.5)
O2 Saturation: 60.4 %
Total hemoglobin: 13.8 g/dL (ref 13.5–18.0)

## 2015-07-29 LAB — HEMOGLOBIN AND HEMATOCRIT, BLOOD
HCT: 27.1 % — ABNORMAL LOW (ref 39.0–52.0)
HCT: 26.5 % — ABNORMAL LOW (ref 39.0–52.0)
HEMOGLOBIN: 9.5 g/dL — AB (ref 13.0–17.0)
HEMOGLOBIN: 9.3 g/dL — AB (ref 13.0–17.0)

## 2015-07-29 LAB — APTT: APTT: 57 s — AB (ref 24–37)

## 2015-07-29 LAB — CULTURE, BLOOD (ROUTINE X 2)
CULTURE: NO GROWTH
Culture: NO GROWTH

## 2015-07-29 LAB — GLUCOSE, CAPILLARY
GLUCOSE-CAPILLARY: 214 mg/dL — AB (ref 65–99)
GLUCOSE-CAPILLARY: 168 mg/dL — AB (ref 65–99)
GLUCOSE-CAPILLARY: 86 mg/dL (ref 65–99)
GLUCOSE-CAPILLARY: 140 mg/dL — AB (ref 65–99)
Glucose-Capillary: 160 mg/dL — ABNORMAL HIGH (ref 65–99)

## 2015-07-29 LAB — VANCOMYCIN, TROUGH: Vancomycin Tr: 24 ug/mL — ABNORMAL HIGH (ref 10.0–20.0)

## 2015-07-29 MED ORDER — VANCOMYCIN HCL IN DEXTROSE 1-5 GM/200ML-% IV SOLN
1000.0000 mg | INTRAVENOUS | Status: DC
  Administered 2015-07-29 – 2015-08-02 (×5): 1000 mg via INTRAVENOUS
  Filled 2015-07-29 (×6): qty 200

## 2015-07-29 NOTE — Progress Notes (Signed)
Pt voided 50cc of frank red blood. Cardiology Fellow notified about pt's urine. Verbal order given to hold IV heparin until hemoglobin and hematocrit labs resulted. Pharmacy is aware of Heparin pause. Will continue to monitor pt.

## 2015-07-29 NOTE — Progress Notes (Signed)
Labs resulted, hgb 9.5 and hct 27.1, verbal order given from Cardiology to restart Heparin and check CBC again in the morning. Will continue to monitor pt.

## 2015-07-29 NOTE — Progress Notes (Signed)
Bleeding noted from trialysis catheter site.  Manual pressure held to stabilize bleeding.  Martin Gens, NP here to assess pt.  Trialysis catheter not in proper position, so removed.  Pressure held to stabilize site.  Will continue to monitor pt closely.

## 2015-07-29 NOTE — Progress Notes (Signed)
Appreciate care of all involved.  Sepsis and hypotension with progressive renal failure now on CVVH. He clinically is quite alert and oriented now. Not SOB.   Kerry Hough. MD [facility] 07/29/2015 8:03 PM

## 2015-07-29 NOTE — Procedures (Signed)
Hemodialysis Catheter Insertion Procedure Note VONG BARGER [id number] 01-09-1931  Procedure: Insertion of Hemodialysis Catheter Indications: Hemodialysis  Procedure Details Consent: Risks of procedure as well as the alternatives and risks of each were explained to the (patient/caregiver).  Consent for procedure obtained.  Time Out: Verified patient identification, verified procedure, site/side was marked, verified correct patient position, special equipment/implants available, medications/allergies/relevent history reviewed, required imaging and test results available.  Performed  Maximum sterile technique was used including antiseptics, cap, gloves, gown, hand hygiene, mask and sheet.  Skin prep: Chlorhexidine; local anesthetic administered  A Trialysis HD catheter was placed in the left internal jugular vein using the Seldinger technique to 20 cm.  Sutured in place.   Evaluation Blood flow good Complications: No apparent complications Patient did tolerate procedure well. Chest X-ray ordered to verify placement.  CXR: pending.   Procedure performed under direct supervision of Dr. Titus Mould and with ultrasound guidance for real time vessel cannulation.     Noe Gens, NP-C [facility] Pgr: [phone] or [phone] 07/29/2015, 1:15 PM  [place] tolerated  Lavon Paganini. Titus Mould, MD, [place] Pgr: [facility]

## 2015-07-29 NOTE — Progress Notes (Signed)
Pharmacy Antibiotic Note  Martin [name] is a 82 y.o. male started on vancomycin and ceftazidime yesterday for possible sepsis 2/2 UTI vs HCAP. Ceftaz changed to Zosyn for anaerobic coverage.  Now day #6 of abx for sepsis with unknown source. CRRT was stopped for a couple hours today but has been running ok since 1430. VT was drawn ~1 hr early today and was elevated at 24. Since drawn early and was off CRRT for a short period today could probably continue current dose but will push back time. Cx's do not show any growth. Afebrile, WBC up to 16.5 but did receive prednisone recently  Plan: Restart vancomycin 1g IV Q24 at 2200  Continue Zosyn 2.25g IV Q6 Monitor clinical picture, renal function, VT prn F/U C&S, abx deescalation / LOT  Consider stopping vancomycin soon and de-escalating therapy?  Height: 5\' 7"  (170.2 cm) Weight: 190 lb 7.6 oz (86.4 kg) IBW/kg (Calculated) : 66.1  Temp (24hrs), Avg:97.9 F (36.6 C), Min:97.4 F (36.3 C), Max:98.2 F (36.8 C)   Recent Labs Lab 07/25/15 0410 07/26/15 0439  07/27/15 0425 07/27/15 1618 07/28/15 0410 07/28/15 1630 07/29/15 0430 07/29/15 1636 07/29/15 1637  WBC 15.0* 18.8*  --  11.6*  --  10.9*  --  16.5*  --   --   CREATININE 3.69* 4.79*  < > 4.03*  4.09* 3.21* 2.78*  2.78* 2.50* 2.23*  2.16* 2.27*  --   VANCOTROUGH  --   --   --   --   --   --   --   --   --  24*  < > = values in this interval not displayed.  Estimated Creatinine Clearance: 26.3 mL/min (by C-G formula based on Cr of 2.27).    Allergies  Allergen Reactions  . Heparin Other (See Comments)    Unknown reaction. HIT Panel negative in 2014.    Antimicrobials this admission: 5/4 Vancomycin >> 5/4 Ceftazidime >> 5/5 5/5 Zosyn >>  Dose adjustments this admission: n/a  Microbiology results: 5/4 urine >> insignificant growth 5/4 blood >>  ngF MRSA PCR negative  Thank you for allowing pharmacy to be a part of this patient's care.  Elenor Quinones,  PharmD, BCPS Clinical Pharmacist Pager [phone] 07/29/2015 5:17 PM

## 2015-07-29 NOTE — Progress Notes (Signed)
[facility] Rounding Note  Subjective/interval history:  Remains anuric, on CRRT (since 5/6) Still on norepi and vasopressin   Weight trending 07/29/15  86.4 kg  07/28/15   88.8 kg (  07/27/15   91 kg   07/26/15 0500 CRRT started 91.5 kg   07/25/15   91.8 kg    Voided small amount of what was described as "pure blood" last PM by the nurses, pt says was a "mix of blood and urine"  Objective: Vital signs in last 24 hours: Temp:  [97.4 F (36.3 C)-98.2 F (36.8 C)] 97.4 F (36.3 C) (05/09 0730) Pulse Rate:  [75-96] 84 (05/09 0800) Resp:  [16-23] 19 (05/09 0800) BP: (88-126)/(51-91) 112/67 mmHg (05/09 0800) SpO2:  [95 %-100 %] 98 % (05/09 0800) Weight:  [86.4 kg (190 lb 7.6 oz)] 86.4 kg (190 lb 7.6 oz) (05/09 0500) Weight change: -2.4 kg (-5 lb 4.7 oz)  Intake/Output from previous day: 05/08 0701 - 05/09 0700 In: 2401.1 [P.O.:160; I.V.:1841.1; IV Piggyback:400] Out: [id number] [Urine:50] Intake/Output this shift: Total I/O In: 132.1 [I.V.:82.1; IV Piggyback:50] Out: 181 [Other:181]  Physical examination Last CVP 14->10 General appearance: alert and cooperative, coughs with deep breathing Resp: diminished breath sounds bilaterally but ant fairly clear Cardio: Irregular [id number] no murmur, click, rub or gallop Extremities: edema 1_+ edema R IJ cath (HD) (5/6) working fine  Lab Results:  Recent Labs  07/28/15 0410 07/29/15 0218 07/29/15 0430  WBC 10.9*  --  16.5*  HGB 9.0* 9.5* 9.4*  HCT 25.9* 27.1* 27.5*  PLT 122*  --  138*     Recent Labs  07/28/15 1630 07/29/15 0430  NA 134* 132*  133*  K 4.5 4.3  4.1  CL 98* 95*  97*  CO2 24 22  21*  GLUCOSE 217* 228*  227*  BUN 38* 36*  34*  CREATININE 2.50* 2.23*  2.16*  CALCIUM 8.1* 8.3*  7.9*  Phosphorus 2.6  Medications Scheduled: . antiseptic oral rinse  7 mL Mouth Rinse BID  . atorvastatin  40 mg Oral q1800  . ferrous sulfate  325 mg Oral Q breakfast  . insulin aspart  0-15 Units Subcutaneous TID WC  . insulin  aspart  0-5 Units Subcutaneous QHS  . piperacillin-tazobactam (ZOSYN)  IV  2.25 g Intravenous Q6H  . sodium chloride flush  10-40 mL Intracatheter Q12H  . sodium chloride flush  3 mL Intravenous Q12H  . vancomycin  1,000 mg Intravenous Q24H   . sodium chloride Stopped (07/29/15 0819)  . amiodarone 60 mg/hr (07/29/15 0800)  . heparin 950 Units/hr (07/29/15 0800)  . norepinephrine (LEVOPHED) Adult infusion 8 mcg/min (07/29/15 0800)  . dialysis replacement fluid (prismasate) 200 mL/hr at 07/28/15 2110  . dialysis replacement fluid (prismasate) 400 mL/hr at 07/28/15 2110  . dialysate (PRISMASATE) 1,000 mL/hr at 07/29/15 0612  . vasopressin (PITRESSIN) infusion - *FOR SHOCK* 0.03 Units/min (07/29/15 0800)    Assessment/Plan: 1. AKI, hemodynamically mediated- AoCKD baseline creatinine 1.8.No evidence for recovery at this time. CRRT (5/6). Net neg 75-100/hour - continue same. CVP down 14->10. All 4K fluids. K and phos OK so far, weight down total of about 4 kg. Repeat CXR today. 2. Gross hematuria - voided blood last PM not much urine. Maybe bladder trauma and on heparin. Hb stable. 3. Shock  - Hypotensive with pressor dependence likely multi-factorial from CHF and sepsis.  4. Sepsis - all cultures negative. On zosyn/vanco.   5. AF with RVR - IV amio 6. Prognosis - would seem  grim. Continuing CRRT for now. Not felt to be a candidate for long term dialysis and family is aware.  Jamal Maes, MD [facility] [phone] Pager 07/29/2015, 8:41 AM

## 2015-07-29 NOTE — Progress Notes (Signed)
ANTICOAGULATION CONSULT NOTE - Follow Up Consult  Pharmacy Consult for Heparin  Indication: atrial fibrillation  Allergies  Allergen Reactions  . Heparin Other (See Comments)    Unknown reaction. HIT Panel negative in 2014.    Patient Measurements: Height: 5\' 7"  (170.2 cm) Weight: 190 lb 7.6 oz (86.4 kg) IBW/kg (Calculated) : 66.1  Vital Signs: Temp: 97.4 F (36.3 C) (05/09 1948) Temp Source: Oral (05/09 1948) BP: 107/58 mmHg (05/09 1930) Pulse Rate: 84 (05/09 1930)  Labs:  Recent Labs  07/27/15  07/27/15 0425  07/27/15 1618 07/28/15 07/28/15 0410 07/28/15 1630 07/29/15 0218 07/29/15 0430 07/29/15 1636 07/29/15 1637 07/29/15 2210  HGB  --   < > 9.9*  --   --   --  9.0*  --  9.5* 9.4*  --  9.3*  --   HCT  --   < > 28.2*  --   --   --  25.9*  --  27.1* 27.5*  --  26.5*  --   PLT  --   --  188  --   --   --  122*  --   --  138*  --   --   --   APTT  --   < >  --   < > 90* 78*  --   --   --   --   --   --  57*  HEPARINUNFRC >2.20*  --   --   --   --   --  1.96*  --   --   --   --   --   --   CREATININE  --   < > 4.03*  4.09*  --  3.21*  --  2.78*  2.78* 2.50*  --  2.23*  2.16* 2.27*  --   --   < > = values in this interval not displayed.  Estimated Creatinine Clearance: 26.3 mL/min (by C-G formula based on Cr of 2.27).   Assessment: APTT is sub-therapeutic s/p re-start after PICC line came out, using aPTT to dose for now given apixaban influence of anti-Xa levels, no issues per RN.   Goal of Therapy:  Heparin level 0.3-0.7 units/ml aPTT 66-102 seconds Monitor platelets by anticoagulation protocol: Yes   Plan:  -Increase heparin to 1050 units/hr -0800 aPTT/HL -Daily CBC/HL/aPTT -Monitor for bleeding  Narda Bonds 07/29/2015,11:46 PM

## 2015-07-29 NOTE — Progress Notes (Addendum)
Advanced Heart Failure Rounding Note   Subjective:    [name] is an 82 year old male with h/o CAD s/p anterior MI in 1985 followed by CABG and PCI of RCA, chronic systolic HF EF [id number], chronic AF and CKD followed by Dr. Wynonia Lawman. We have been asked to consult for possible low output HF.   Over past few weeks has been struggling with progressive volume overload, fatigue and DOE. Saw Dr. Wynonia Lawman in the office and was refractory to titration of oral diuretics so was admitted for IV diuresis. Did not respond well to IV lasix. Developed renal failure.  Moved to ICU due to hypotension. Urine with many bacteria. Developed urosepsis. Milrinone stopped norepi started.   Started on CVVHD 07/26/15. Weight down another 5 lbs overnight.   Feels Ok this morning.  On norepi 8 (down from peak of 38) and vasopressin 0.03. SBP 110s currently. No SOB.. On heparin for AF and CVVHD. WBC up 10.9 -> 16.5. Hgb stable at 9.4. Had 100 cc on bladder scan last night, he was able to avoid a small amount that ended up being gross hematuria.   UA- many bacteria. Insignificant growth  Blood Cultures 07/24/15- NGTD    Objective:   Weight Range:  Vital Signs:   Temp:  [97.5 F (36.4 C)-98.2 F (36.8 C)] 97.6 F (36.4 C) (05/09 0400) Pulse Rate:  [84-96] 87 (05/08 1900) Resp:  [16-23] 17 (05/09 0700) BP: (88-126)/(51-91) 111/70 mmHg (05/09 0700) SpO2:  [95 %-100 %] 100 % (05/09 0700) Weight:  [190 lb 7.6 oz (86.4 kg)] 190 lb 7.6 oz (86.4 kg) (05/09 0500) Last BM Date: 07/25/15  Weight change: Filed Weights   07/27/15 0444 07/28/15 0500 07/29/15 0500  Weight: 200 lb 9.9 oz (91 kg) 195 lb 12.3 oz (88.8 kg) 190 lb 7.6 oz (86.4 kg)    Intake/Output:   Intake/Output Summary (Last 24 hours) at 07/29/15 0719 Last data filed at 07/29/15 0701  Gross per 24 hour  Intake 2398.57 ml  Output   4876 ml  Net -2477.43 ml     Physical Exam: CVP 8-9 General: Elderly male. Lying in bed NAD HEENT: normal Neck:  supple. RIJ trialysis cath. JVP 8-9  Carotids 2+ bilat; no bruits. No thyromegaly or nodule noted.  Cor: PMI laterally displaced. IRR  Lungs:Clear. On 2 L via [place]. Abdomen: soft, non-tender, non-distended, no HSM. No bruits or masses. +BS  Extremities: no cyanosis, clubbing, rash, trace - edema Neuro: alert & orientedx3, cranial nerves grossly intact. moves all 4 extremities w/o difficulty. Affect pleasant  Telemetry: Reviewed personally, AF 80-90s  Labs: Basic Metabolic Panel:  Recent Labs Lab 07/27/15 0425 07/27/15 1618 07/28/15 0410 07/28/15 1630 07/29/15 0430  NA 127*  127* 130* 131*  131* 134* 132*  133*  K 4.5  4.5 4.4 4.3  4.3 4.5 4.3  4.1  CL 91*  91* 92* 95*  95* 98* 95*  97*  CO2 19*  19* 21* 22  22 24 22   21*  GLUCOSE 236*  238* 231* 241*  240* 217* 228*  227*  BUN 80*  80* 61* 48*  48* 38* 36*  34*  CREATININE 4.03*  4.09* 3.21* 2.78*  2.78* 2.50* 2.23*  2.16*  CALCIUM 8.1*  8.1* 8.1* 8.1*  8.0* 8.1* 8.3*  7.9*  MG 2.3  --  2.4  --  2.5*  PHOS 4.7* 3.3 3.2 2.9 2.6    Liver Function Tests:  Recent Labs Lab 07/27/15 0425 07/27/15 1618 07/28/15  0410 07/28/15 1630 07/29/15 0430  ALBUMIN 2.1* 2.1* 2.4* 2.4* 2.3*   No results for input(s): LIPASE, AMYLASE in the last 168 hours. No results for input(s): AMMONIA in the last 168 hours.  CBC:  Recent Labs Lab 07/24/15 1129 07/25/15 0410 07/26/15 0439 07/27/15 0425 07/28/15 0410 07/29/15 0218 07/29/15 0430  WBC 11.8* 15.0* 18.8* 11.6* 10.9*  --  16.5*  NEUTROABS 10.5* 14.1*  --   --   --   --   --   HGB 8.1* 8.8* 10.8* 9.9* 9.0* 9.5* 9.4*  HCT 24.6* 27.0* 31.7* 28.2* 25.9* 27.1* 27.5*  MCV 101.2* 101.9* 100.0 94.6 94.9  --  95.5  PLT 75* 97* 200 188 122*  --  138*    Cardiac Enzymes: No results for input(s): CKTOTAL, CKMB, CKMBINDEX, TROPONINI in the last 168 hours.  BNP: BNP (last 3 results)  Recent Labs  07/21/15 1838  BNP 1660.5*    ProBNP (last 3 results) No  results for input(s): PROBNP in the last 8760 hours.    Other results:  Imaging: No results found.   Medications:     Scheduled Medications: . antiseptic oral rinse  7 mL Mouth Rinse BID  . atorvastatin  40 mg Oral q1800  . ferrous sulfate  325 mg Oral Q breakfast  . insulin aspart  0-15 Units Subcutaneous TID WC  . insulin aspart  0-5 Units Subcutaneous QHS  . piperacillin-tazobactam (ZOSYN)  IV  2.25 g Intravenous Q6H  . sodium chloride flush  10-40 mL Intracatheter Q12H  . sodium chloride flush  3 mL Intravenous Q12H  . vancomycin  1,000 mg Intravenous Q24H    Infusions: . sodium chloride 10 mL/hr at 07/28/15 1431  . amiodarone 60 mg/hr (07/29/15 0608)  . heparin 950 Units/hr (07/29/15 [id number])  . norepinephrine (LEVOPHED) Adult infusion 8 mcg/min (07/29/15 0701)  . dialysis replacement fluid (prismasate) 200 mL/hr at 07/28/15 2110  . dialysis replacement fluid (prismasate) 400 mL/hr at 07/28/15 2110  . dialysate (PRISMASATE) 1,000 mL/hr at 07/29/15 0612  . vasopressin (PITRESSIN) infusion - *FOR SHOCK* 0.03 Units/min (07/28/15 1231)    PRN Medications: sodium chloride, acetaminophen, alteplase, fentaNYL (SUBLIMAZE) injection, guaiFENesin, heparin, heparin, heparin, levalbuterol, ondansetron (ZOFRAN) IV, phenol, sodium chloride flush, sodium chloride flush, traMADol   Assessment:   1.Urosepsis  2. Acute on chronic systolic HF  2. Chronic AF now with RVR 3. Ischemic CM EF 20-25% 4. Acute on chronic renal failure, stage IV -> CVVHD started 5/6. CAD s/p anterior MI followed by CABG and PCI RCA 6. Acute respiratory failure 7. Hypotension 8. Hypokalemia 9. Acute Gout Flare    Plan/Discussion:    Has severe sepsis with worsening renal failure. Remains anuric on CVVHD. Remains on dual pressors but improving. Will continue to wean norepi as tolerated, currently at 8.    On vancomycin and zosyn. Cultures with NGTD.  WBC up overnight. Afebrile  Remains on amio for  rate control of chronic AF. HR improved, currently stable in 90s. Off apixaban for now and on heparin.   HF stable. Co-ox 60.4%. Volume removal ongoing with CVVHD.   If no renal recover in 1-2 weeks will need Hospice. He is not candidate for long-term HD with severe HF.   Satira Mccallum [facility] 7:19 AM Advanced Heart Failure Team Pager [phone] (M-F; 7a - 4p)  Please contact [facility] for night-coverage after hours (4p -7a ) and weekends on [email or web address]  Patient seen and examined with Oda Kilts, PA-C. We discussed all aspects of  the encounter. I agree with the assessment and plan as stated above.   Remains on CVVHD. Anuric. Feels ok. + cough. WBC climbing but no fever. On vanc and zosyn. Did receive prednisone yesterday.   We discussed time course of renal recovery. Given pre-existing CKD understands that he may not recover or recovery could be delayed. I favor several more days of watchful waiting. Check CXR. Provide incentive spirometer. AF controlled on IV amio.    The patient is critically ill with multiple organ systems failure and requires high complexity decision making for assessment and support, frequent evaluation and titration of therapies, application of advanced monitoring technologies and extensive interpretation of multiple databases.   Critical Care Time devoted to patient care services described in this note is 35 Minutes.   Carlee Vonderhaar,MD 10:28 AM

## 2015-07-29 NOTE — Progress Notes (Signed)
Called to bedside for leaking HD catheter.  R IJ HD catheter noted to be leaking from site and malpositioned.   Catheter was pulled back to approx 5 cm at skin.  Unable to return blood due to positioning.  Instructed RN to discontinue HD cath and hold pressure until hemostasis.   Will assess L IJ for HD catheter placement.   Martin Gens, NP-C [facility] Pgr: [phone] or if no answer [phone] 07/29/2015, 11:45 AM  Martin [name]. Titus Mould, MD, [place] Pgr: [facility]

## 2015-07-30 ENCOUNTER — Inpatient Hospital Stay (HOSPITAL_COMMUNITY): Payer: Medicare Other

## 2015-07-30 LAB — RENAL FUNCTION PANEL
ALBUMIN: 2.4 g/dL — AB (ref 3.5–5.0)
ANION GAP: 15 (ref 5–15)
Albumin: 2.4 g/dL — ABNORMAL LOW (ref 3.5–5.0)
Anion gap: 15 (ref 5–15)
BUN: 30 mg/dL — ABNORMAL HIGH (ref 6–20)
BUN: 27 mg/dL — AB (ref 6–20)
CHLORIDE: 98 mmol/L — AB (ref 101–111)
CHLORIDE: 99 mmol/L — AB (ref 101–111)
CO2: 23 mmol/L (ref 22–32)
CO2: 22 mmol/L (ref 22–32)
CREATININE: 2.23 mg/dL — AB (ref 0.61–1.24)
CREATININE: 2.14 mg/dL — AB (ref 0.61–1.24)
Calcium: 8.3 mg/dL — ABNORMAL LOW (ref 8.9–10.3)
Calcium: 8.8 mg/dL — ABNORMAL LOW (ref 8.9–10.3)
GFR calc Af Amer: 31 mL/min — ABNORMAL LOW (ref >60)
GFR, EST AFRICAN AMERICAN: 30 mL/min — AB (ref >60)
GFR, EST NON AFRICAN AMERICAN: 26 mL/min — AB (ref >60)
GFR, EST NON AFRICAN AMERICAN: 27 mL/min — AB (ref >60)
GLUCOSE: 150 mg/dL — AB (ref 65–99)
Glucose, Bld: 161 mg/dL — ABNORMAL HIGH (ref 65–99)
POTASSIUM: 4.1 mmol/L (ref 3.5–5.1)
Phosphorus: 2 mg/dL — ABNORMAL LOW (ref 2.5–4.6)
Phosphorus: 2.5 mg/dL (ref 2.5–4.6)
Potassium: 4.2 mmol/L (ref 3.5–5.1)
Sodium: 136 mmol/L (ref 135–145)
Sodium: 136 mmol/L (ref 135–145)

## 2015-07-30 LAB — HEPARIN LEVEL (UNFRACTIONATED): HEPARIN UNFRACTIONATED: 1.03 [IU]/mL — AB (ref 0.30–0.70)

## 2015-07-30 LAB — BASIC METABOLIC PANEL
ANION GAP: 15 (ref 5–15)
BUN: 31 mg/dL — ABNORMAL HIGH (ref 6–20)
CALCIUM: 8.3 mg/dL — AB (ref 8.9–10.3)
CHLORIDE: 98 mmol/L — AB (ref 101–111)
CO2: 23 mmol/L (ref 22–32)
Creatinine, Ser: 2.22 mg/dL — ABNORMAL HIGH (ref 0.61–1.24)
GFR calc non Af Amer: 26 mL/min — ABNORMAL LOW (ref >60)
GFR, EST AFRICAN AMERICAN: 30 mL/min — AB (ref >60)
GLUCOSE: 161 mg/dL — AB (ref 65–99)
Potassium: 4.1 mmol/L (ref 3.5–5.1)
Sodium: 136 mmol/L (ref 135–145)

## 2015-07-30 LAB — GLUCOSE, CAPILLARY
GLUCOSE-CAPILLARY: 167 mg/dL — AB (ref 65–99)
GLUCOSE-CAPILLARY: 161 mg/dL — AB (ref 65–99)
Glucose-Capillary: 151 mg/dL — ABNORMAL HIGH (ref 65–99)
Glucose-Capillary: 178 mg/dL — ABNORMAL HIGH (ref 65–99)

## 2015-07-30 LAB — CBC
HEMATOCRIT: 26.3 % — AB (ref 39.0–52.0)
HEMOGLOBIN: 9.1 g/dL — AB (ref 13.0–17.0)
MCH: 32.6 pg (ref 26.0–34.0)
MCHC: 34.6 g/dL (ref 30.0–36.0)
MCV: 94.3 fL (ref 78.0–100.0)
Platelets: 130 10*3/uL — ABNORMAL LOW (ref 150–400)
RBC: 2.79 MIL/uL — AB (ref 4.22–5.81)
RDW: 15 % (ref 11.5–15.5)
WBC: 20.1 10*3/uL — ABNORMAL HIGH (ref 4.0–10.5)

## 2015-07-30 LAB — CARBOXYHEMOGLOBIN
CARBOXYHEMOGLOBIN: 1.8 % — AB (ref 0.5–1.5)
Methemoglobin: 0.7 % (ref 0.0–1.5)
O2 SAT: 52.9 %
TOTAL HEMOGLOBIN: 11 g/dL — AB (ref 13.5–18.0)

## 2015-07-30 LAB — APTT: APTT: 69 s — AB (ref 24–37)

## 2015-07-30 LAB — MAGNESIUM: Magnesium: 2.4 mg/dL (ref 1.7–2.4)

## 2015-07-30 MED ORDER — SODIUM CHLORIDE 0.9 % IV SOLN
100.0000 mg | INTRAVENOUS | Status: DC
  Administered 2015-07-31 – 2015-08-04 (×5): 100 mg via INTRAVENOUS
  Filled 2015-07-30 (×6): qty 100

## 2015-07-30 MED ORDER — SODIUM PHOSPHATES 45 MMOLE/15ML IV SOLN
10.0000 mmol | Freq: Once | INTRAVENOUS | Status: AC
  Administered 2015-07-30: 10 mmol via INTRAVENOUS
  Filled 2015-07-30: qty 3.33

## 2015-07-30 MED ORDER — ENSURE ENLIVE PO LIQD
237.0000 mL | Freq: Two times a day (BID) | ORAL | Status: DC
  Administered 2015-07-31 – 2015-08-02 (×2): 237 mL via ORAL

## 2015-07-30 MED ORDER — SODIUM CHLORIDE 0.9 % IV SOLN
200.0000 mg | Freq: Once | INTRAVENOUS | Status: AC
  Administered 2015-07-30: 200 mg via INTRAVENOUS
  Filled 2015-07-30: qty 200

## 2015-07-30 MED ORDER — PRO-STAT SUGAR FREE PO LIQD
30.0000 mL | Freq: Three times a day (TID) | ORAL | Status: DC
  Administered 2015-07-30 – 2015-08-02 (×10): 30 mL via ORAL
  Filled 2015-07-30 (×10): qty 30

## 2015-07-30 MED ORDER — SODIUM CHLORIDE 0.9 % IV SOLN
500.0000 mg | Freq: Three times a day (TID) | INTRAVENOUS | Status: DC
  Administered 2015-07-30 – 2015-07-31 (×4): 500 mg via INTRAVENOUS
  Filled 2015-07-30 (×6): qty 500

## 2015-07-30 NOTE — Progress Notes (Signed)
Advanced Heart Failure Rounding Note   Subjective:    [name] is an 82 year old male with h/o CAD s/p anterior MI in 1985 followed by CABG and PCI of RCA, chronic systolic HF EF 123456, chronic AF and CKD followed by Dr. Wynonia Lawman. We have been asked to consult for possible low output HF.   Over past few weeks has been struggling with progressive volume overload, fatigue and DOE. Saw Dr. Wynonia Lawman in the office and was refractory to titration of oral diuretics so was admitted for IV diuresis. Did not respond well to IV lasix. Developed renal failure.  Moved to ICU due to hypotension. Urine with many bacteria. Developed urosepsis. Milrinone stopped norepi started.   Started on CVVHD 07/26/15.   HD cath replaced and now LIJ as of 07/29/15 2/2 leaking and mal-positioning.   Down another 4 lbs.  Remains anuric. WBC trending up 10.9 -> 16.5 -> 20.1 despite broad spectrum ABX .  Hgb stable > 9.0.  Denies CP or SOB. Is down today from lack of improvement.   On norepi 6 (down from peak of 38) and vasopressin 0.03. SBP 100s  UA- many bacteria. Insignificant growth UCx 07/24/15 - Insignificant growth Blood Cultures 07/24/15- NGTD    Objective:   Weight Range:  Vital Signs:   Temp:  [97.4 F (36.3 C)-98.5 F (36.9 C)] 98.4 F (36.9 C) (05/10 0359) Pulse Rate:  [75-94] 92 (05/09 2347) Resp:  [16-28] 21 (05/10 0600) BP: (75-131)/(49-72) 108/49 mmHg (05/10 0600) SpO2:  [88 %-100 %] 98 % (05/10 0600) Weight:  [186 lb 4.6 oz (84.5 kg)] 186 lb 4.6 oz (84.5 kg) (05/10 0359) Last BM Date: 07/29/15  Weight change: Filed Weights   07/28/15 0500 07/29/15 0500 07/30/15 0359  Weight: 195 lb 12.3 oz (88.8 kg) 190 lb 7.6 oz (86.4 kg) 186 lb 4.6 oz (84.5 kg)    Intake/Output:   Intake/Output Summary (Last 24 hours) at 07/30/15 0713 Last data filed at 07/30/15 0600  Gross per 24 hour  Intake 2085.76 ml  Output   3652 ml  Net -1566.24 ml     Physical Exam: CVP 10 General: Elderly male. Lying in  bed NAD HEENT: normal Neck: supple. LIJ trialysis cath. JVP 8-9  Carotids 2+ bilat; no bruits. No thyromegaly or nodule noted.  Cor: PMI laterally displaced. IRR  Lungs: Scattered rhonchi that clear with cough. On 2 L via [place]. Abdomen: soft, non-tender, non-distended, no HSM. No bruits or masses. +BS  Extremities: no cyanosis, clubbing, rash, trace ankle edema only Neuro: alert & orientedx3, cranial nerves grossly intact. moves all 4 extremities w/o difficulty. Affect pleasant  Telemetry: Reviewed personally, AF 80-90s  Labs: Basic Metabolic Panel:  Recent Labs Lab 07/27/15 0425  07/28/15 0410 07/28/15 1630 07/29/15 0430 07/29/15 1636 07/30/15 0215  NA 127*  127*  < > 131*  131* 134* 132*  133* 135 136  136  K 4.5  4.5  < > 4.3  4.3 4.5 4.3  4.1 4.0 4.1  4.1  CL 91*  91*  < > 95*  95* 98* 95*  97* 99* 98*  98*  CO2 19*  19*  < > 22  22 24 22   21* 24 23  23   GLUCOSE 236*  238*  < > 241*  240* 217* 228*  227* 182* 161*  161*  BUN 80*  80*  < > 48*  48* 38* 36*  34* 34* 31*  30*  CREATININE 4.03*  4.09*  < >  2.78*  2.78* 2.50* 2.23*  2.16* 2.27* 2.22*  2.23*  CALCIUM 8.1*  8.1*  < > 8.1*  8.0* 8.1* 8.3*  7.9* 8.3* 8.3*  8.3*  MG 2.3  --  2.4  --  2.5*  --  2.4  PHOS 4.7*  < > 3.2 2.9 2.6 2.2* 2.0*  < > = values in this interval not displayed.  Liver Function Tests:  Recent Labs Lab 07/28/15 0410 07/28/15 1630 07/29/15 0430 07/29/15 1636 07/30/15 0215  ALBUMIN 2.4* 2.4* 2.3* 2.5* 2.4*   No results for input(s): LIPASE, AMYLASE in the last 168 hours. No results for input(s): AMMONIA in the last 168 hours.  CBC:  Recent Labs Lab 07/24/15 1129 07/25/15 0410 07/26/15 0439 07/27/15 0425 07/28/15 0410 07/29/15 0218 07/29/15 0430 07/29/15 1637 07/30/15 0215  WBC 11.8* 15.0* 18.8* 11.6* 10.9*  --  16.5*  --  20.1*  NEUTROABS 10.5* 14.1*  --   --   --   --   --   --   --   HGB 8.1* 8.8* 10.8* 9.9* 9.0* 9.5* 9.4* 9.3* 9.1*  HCT 24.6*  27.0* 31.7* 28.2* 25.9* 27.1* 27.5* 26.5* 26.3*  MCV 101.2* 101.9* 100.0 94.6 94.9  --  95.5  --  94.3  PLT 75* 97* 200 188 122*  --  138*  --  130*    Cardiac Enzymes: No results for input(s): CKTOTAL, CKMB, CKMBINDEX, TROPONINI in the last 168 hours.  BNP: BNP (last 3 results)  Recent Labs  07/21/15 1838  BNP 1660.5*    ProBNP (last 3 results) No results for input(s): PROBNP in the last 8760 hours.    Other results:  Imaging: Dg Chest Port 1 View  07/29/2015  CLINICAL DATA:  Central catheter placement EXAM: PORTABLE CHEST 1 VIEW COMPARISON:  Jul 26, 2015 FINDINGS: The right jugular catheter has been removed. Left jugular catheter tip is in the superior vena cava. A the right subclavian catheter tip is also in the superior vena cava. No pneumothorax. Pacemaker lead is attached to the right ventricle. There is cardiomegaly with pulmonary venous hypertension. There is interstitial edema with bilateral effusions. No adenopathy is evident. There are surgical clips in the left axillary region. IMPRESSION: Central catheter tips are in the superior vena cava. No pneumothorax. Evidence of congestive heart failure. Effusions appear slightly larger currently compared to recent prior study. Electronically Signed   By: Lowella Grip III M.D.   On: 07/29/2015 13:30     Medications:     Scheduled Medications: . antiseptic oral rinse  7 mL Mouth Rinse BID  . atorvastatin  40 mg Oral q1800  . ferrous sulfate  325 mg Oral Q breakfast  . insulin aspart  0-15 Units Subcutaneous TID WC  . insulin aspart  0-5 Units Subcutaneous QHS  . piperacillin-tazobactam (ZOSYN)  IV  2.25 g Intravenous Q6H  . sodium chloride flush  10-40 mL Intracatheter Q12H  . sodium chloride flush  3 mL Intravenous Q12H  . vancomycin  1,000 mg Intravenous Q24H    Infusions: . sodium chloride Stopped (07/29/15 1100)  . amiodarone 60 mg/hr (07/30/15 0046)  . heparin 1,050 Units/hr (07/29/15 2350)  . norepinephrine  (LEVOPHED) Adult infusion 6 mcg/min (07/30/15 0433)  . dialysis replacement fluid (prismasate) 200 mL/hr at 07/30/15 0127  . dialysis replacement fluid (prismasate) 400 mL/hr at 07/30/15 0127  . dialysate (PRISMASATE) 1,000 mL/hr at 07/30/15 0603  . vasopressin (PITRESSIN) infusion - *FOR SHOCK* 0.03 Units/min (07/30/15 0046)  PRN Medications: sodium chloride, acetaminophen, alteplase, fentaNYL (SUBLIMAZE) injection, guaiFENesin, heparin, heparin, heparin, levalbuterol, ondansetron (ZOFRAN) IV, phenol, sodium chloride flush, sodium chloride flush, traMADol   Assessment:   1.Urosepsis  2. Acute on chronic systolic HF  2. Chronic AF now with RVR 3. Ischemic CM EF 20-25% 4. Acute on chronic renal failure, stage IV -> CVVHD started 5/6. CAD s/p anterior MI followed by CABG and PCI RCA 6. Acute respiratory failure 7. Hypotension 8. Hypokalemia 9. Acute Gout Flare    Plan/Discussion:    Has severe sepsis with worsening renal failure. Remains anuric on CVVHD. Remains norepi weaning as tolerated, currently at 6.    On vancomycin and zosyn. 07/24/15 Cultures with NGTD.  WBC continues to trend up.  Will re-culture.   Remains on amio for rate control of chronic AF. HR stable in 90s. Off apixaban for now and on heparin.   HF stable. Co-ox pending. Volume removal ongoing with CVVHD.  CVP ~10  If no renal recover in 1-2 weeks will need Hospice. He is not candidate for long-term HD with severe HF.   Satira Mccallum [facility] 7:13 AM Advanced Heart Failure Team Pager [phone] (M-F; 7a - 4p)  Please contact [facility] for night-coverage after hours (4p -7a ) and weekends on [email or web address]  Patient seen and examined with Oda Kilts, PA-C. We discussed all aspects of the encounter. I agree with the assessment and plan as stated above.   Remains on CVVHD. Anuric. Much more lethargic today. WBC climbing despite vanc/zosyn. Co-ox down. I am concerned about recurrent sepsis - possibly fungal.    Will switch zosyn to imipenem and add fluconazole. Chance of renal recovery getting smaller but likely reasonable to give him a few more days. Will increase norepi for low-cox. Continue heparin and amio for AF.  Marland KitchenThe patient is critically ill with multiple organ systems failure and requires high complexity decision making for assessment and support, frequent evaluation and titration of therapies, application of advanced monitoring technologies and extensive interpretation of multiple databases.   Critical Care Time devoted to patient care services described in this note is 35 Minutes.  Jaylean Buenaventura,MD 2:03 PM

## 2015-07-30 NOTE — Progress Notes (Signed)
[facility] Rounding Note  Subjective/interval history:  Dialysis catheter replaced yesterday - had became malpositioned - now new left IJ No further catheter issues No Urine output Weight down another 2 kg to 84.5 kg Denies SOB but CXR still wet No pain   Weight trending 07/30/15  84.5 kg  07/29/15  86.4 kg  07/28/15   88.8 kg (  07/27/15   91 kg   07/26/15 0500 CRRT started 91.5 kg   07/25/15   91.8 kg     Objective: Vital signs in last 24 hours: Temp:  [97.4 F (36.3 C)-98.5 F (36.9 C)] 98.4 F (36.9 C) (05/10 0359) Pulse Rate:  [76-94] 92 (05/09 2347) Resp:  [16-28] 21 (05/10 0600) BP: (75-131)/(49-72) 108/49 mmHg (05/10 0600) SpO2:  [88 %-100 %] 98 % (05/10 0600) Weight:  [84.5 kg (186 lb 4.6 oz)] 84.5 kg (186 lb 4.6 oz) (05/10 0359) Weight change: -1.9 kg (-4 lb 3 oz)  Intake/Output from previous day: 05/09 0701 - 05/10 0700 In: 2093.3 [P.O.:210; I.V.:1483.3; IV Piggyback:400] Out: 3652  Intake/Output this shift:    Physical examination Last CVP 11 General appearance: alert and cooperative Resp: diminished breath sounds bilaterally but ant clear  Cardio: Irregular [id number] no murmur, click, rub or gallop Extremities: edema trace LE's. + sacral. New left IJ HD cath (5/10) working fine  Lab Results:  Recent Labs  07/29/15 0430 07/29/15 1637 07/30/15 0215  WBC 16.5*  --  20.1*  HGB 9.4* 9.3* 9.1*  HCT 27.5* 26.5* 26.3*  PLT 138*  --  130*     Recent Labs  07/29/15 1636 07/30/15 0215  NA 135 136  136  K 4.0 4.1  4.1  CL 99* 98*  98*  CO2 24 23  23   GLUCOSE 182* 161*  161*  BUN 34* 31*  30*  CREATININE 2.27* 2.22*  2.23*  CALCIUM 8.3* 8.3*  8.3*   Results for Martin [name], Martin [name] (MRN [id number]) as of 07/30/2015 08:19  Ref. Range 07/28/2015 16:30 07/29/2015 04:30 07/29/2015 16:36 07/30/2015 02:15  Phosphorus Latest Ref Range: 2.5-4.6 mg/dL 2.9 2.6 2.2 (L) 2.0 (L)   Medications Scheduled: . antiseptic oral rinse  7 mL Mouth Rinse BID  . atorvastatin   40 mg Oral q1800  . ferrous sulfate  325 mg Oral Q breakfast  . insulin aspart  0-15 Units Subcutaneous TID WC  . insulin aspart  0-5 Units Subcutaneous QHS  . piperacillin-tazobactam (ZOSYN)  IV  2.25 g Intravenous Q6H  . sodium chloride flush  10-40 mL Intracatheter Q12H  . sodium chloride flush  3 mL Intravenous Q12H  . vancomycin  1,000 mg Intravenous Q24H   . sodium chloride Stopped (07/29/15 1100)  . amiodarone 60 mg/hr (07/30/15 0718)  . heparin 1,050 Units/hr (07/29/15 2350)  . norepinephrine (LEVOPHED) Adult infusion 6 mcg/min (07/30/15 0717)  . dialysis replacement fluid (prismasate) 200 mL/hr at 07/30/15 0127  . dialysis replacement fluid (prismasate) 400 mL/hr at 07/30/15 0127  . dialysate (PRISMASATE) 1,000 mL/hr at 07/30/15 0603  . vasopressin (PITRESSIN) infusion - *FOR SHOCK* 0.03 Units/min (07/30/15 0046)   EXAM: PORTABLE CHEST 1 VIEW COMPARISON: Jul 26, 2015 FINDINGS: The right jugular catheter has been removed. Left jugular catheter tip is in the superior vena cava. A the right subclavian catheter tip is also in the superior vena cava. No pneumothorax. Pacemaker lead is attached to the right ventricle. There is cardiomegaly with pulmonary venous hypertension. There is interstitial edema with bilateral effusions. No adenopathy is evident. There are  surgical clips in the left axillary region. IMPRESSION: Central catheter tips are in the superior vena cava. No pneumothorax. Evidence of congestive heart failure. Effusions appear slightly larger currently compared to recent prior study. Electronically Signed By: Lowella Grip III M.D. On: 07/29/2015 13:30   Assessment/Plan:  1. AKI, hemodynamically mediated- AoCKD baseline creatinine 1.8.No evidence for recovery at this time. CRRT (started 5/6). Net neg 75-125/hour - continue same. CVP 11.  All 4K fluids. K is fine, phos is down to 2 so will go ahead and replace this AM. CXR still with interstitial edema  and effusions so will continue pulling 75-125/hour. No evidence kidney function return. Bladder scan only about 34 cc 2. Gross hematuria - voided blood 5/8 not much urine. Maybe bladder trauma and on heparin. Hb stable. 3. Shock  - Hypotensive with pressor dependence likely multi-factorial from CHF and sepsis. Pressors down some with good BP today 4. Sepsis - all cultures negative. On zosyn/vanco.  HCAP vs UTI 5. AF with RVR - IV amio 6. Prognosis - would seem grim. Continuing CRRT for now. Not felt to be a candidate for long term dialysis and family is aware.  Jamal Maes, MD [facility] [phone] Pager 07/30/2015, 8:17 AM

## 2015-07-30 NOTE — Progress Notes (Signed)
Pharmacy Antibiotic Note  Martin [name] is a 82 y.o. male started on vancomycin and ceftazidime yesterday for possible sepsis 2/2 UTI vs HCAP. Ceftaz changed to Zosyn for anaerobic coverage.  Now day #7 of abx for sepsis with unknown source. Afebrile, WBC up to 20 but did receive prednisone 40mg  x6 doses for gout recently.  He is more lethargic today and COOX dropped 70>52, will reculture and broaden ABX including adding antifungal coverage   Plan: Continue vancomycin 1g IV Q24 at 2200  stop Zosyn 2.25g IV Q6 Start imipenem 500mg  q8 anidulofungin 200mg  x1 then 100mg  qd Monitor clinical picture, renal function, VT prn F/U C&S, abx deescalation / LOT    Height: 5\' 7"  (170.2 cm) Weight: 186 lb 4.6 oz (84.5 kg) IBW/kg (Calculated) : 66.1  Temp (24hrs), Avg:98 F (36.7 C), Min:97.4 F (36.3 C), Max:98.5 F (36.9 C)   Recent Labs Lab 07/26/15 0439  07/27/15 0425  07/28/15 0410 07/28/15 1630 07/29/15 0430 07/29/15 1636 07/29/15 1637 07/30/15 0215  WBC 18.8*  --  11.6*  --  10.9*  --  16.5*  --   --  20.1*  CREATININE 4.79*  < > 4.03*  4.09*  < > 2.78*  2.78* 2.50* 2.23*  2.16* 2.27*  --  2.22*  2.23*  VANCOTROUGH  --   --   --   --   --   --   --   --  24*  --   < > = values in this interval not displayed.  Estimated Creatinine Clearance: 26.7 mL/min (by C-G formula based on Cr of 2.22).    No Active Allergies  Antimicrobials this admission: 5/4 Vancomycin >> 5/4 Ceftazidime >> 5/5 5/5 Zosyn >>5/10 5/10 imipenem> 5/10 anidulofungin>  Dose adjustments this admission: n/a  Microbiology results: 5/4 urine >> insignificant growth 5/4 blood >>  ngF MRSA PCR negative 5/10 Bcx   Bonnita Nasuti Pharm.D. CPP, BCPS Clinical Pharmacist [phone] 07/30/2015 2:12 PM

## 2015-07-30 NOTE — Progress Notes (Signed)
ANTICOAGULATION CONSULT NOTE - Follow Up Consult  Pharmacy Consult for Heparin  Indication: atrial fibrillation  Allergies  Allergen Reactions  . Heparin Other (See Comments)    Unknown reaction. HIT Panel negative in 2014.    Patient Measurements: Height: 5\' 7"  (170.2 cm) Weight: 186 lb 4.6 oz (84.5 kg) IBW/kg (Calculated) : 66.1  Vital Signs: Temp: 98.2 F (36.8 C) (05/10 0838) Temp Source: Oral (05/10 0838) BP: 131/82 mmHg (05/10 1030) Pulse Rate: 90 (05/10 0838)  Labs:  Recent Labs  07/28/15 07/28/15 0410  07/29/15 0430 07/29/15 1636 07/29/15 1637 07/29/15 2210 07/30/15 0215 07/30/15 0844  HGB  --  9.0*  < > 9.4*  --  9.3*  --  9.1*  --   HCT  --  25.9*  < > 27.5*  --  26.5*  --  26.3*  --   PLT  --  122*  --  138*  --   --   --  130*  --   APTT 78*  --   --   --   --   --  57*  --  69*  HEPARINUNFRC  --  1.96*  --   --   --   --   --   --  1.03*  CREATININE  --  2.78*  2.78*  < > 2.23*  2.16* 2.27*  --   --  2.22*  2.23*  --   < > = values in this interval not displayed.  Estimated Creatinine Clearance: 26.7 mL/min (by C-G formula based on Cr of 2.22).   Assessment: 82 yo M admitted with low output HF. Pt has chronic afib and was taking apixaban PTA. Apixaban was initially continued during admission, but it was stopped and changed to heparin on 5/6 when the decision was made to initiate CVVHD due to acute renal failure.  HL still elevated 1.03 today, so we will continue to use aPTTs to monitor until apixaban has washed out Heparin drip 1050 uts/hr aPTT 69 at goal.  H/h low stable, some hematuria yesterday now improved.     Goal of Therapy:  Heparin level 0.3-0.7 units/ml aPTT 66-102 seconds Monitor platelets by anticoagulation protocol: Yes   Plan:  Continue heparin to 1050 units/hr -Daily CBC/HL/aPTT -Monitor for bleeding  Bonnita Nasuti Pharm.D. CPP, BCPS Clinical Pharmacist [phone] 07/30/2015 12:11 PM

## 2015-07-30 NOTE — Progress Notes (Signed)
Initial Nutrition Assessment  DOCUMENTATION CODES:   Not applicable  INTERVENTION:  -Ensure Enlive BID. Each supplement provides 350 kcals and 20 grams of protein. -Prostat TID. Each supplement provides 100 kcals and 15 grams of protein.   NUTRITION DIAGNOSIS:   Inadequate oral intake related to poor appetite as evidenced by per patient/family report.  GOAL:   Patient will meet greater than or equal to 90% of their needs  MONITOR:   Supplement acceptance, PO intake, Diet advancement, Labs, Weight trends, Skin, I & O's  REASON FOR ASSESSMENT:   Other (Comment) (Intake, RN)    ASSESSMENT:   Pt with h/o CAD s/p anterior MI in 1985 followed by CABG and PCI of RCA, chronic systolic HF EF 123456, chronic AF and CKD. Moved to ICU due to hypotension. Urine with many bacteria. Developed urosepsis.   RN suspects pt has a fungal infection.  Pt currently on CVVHD. Per chart, pt may be transitioned to hospice care in the next week if pt cannot come off CVVHD.  Pt weight is stable.   5/9  Spoke with pt. Pt refused supplements at this time. Pt PO's 25-75%.   5/10 RN reports PO's 0%.  RN requested supplements for pt d/t inadequate PO's.  Pt has requested no visitors and does not want to be bothered at this time.  Pt has increased protein needs. Will order Prostat and Ensure to supplement diet.  NFPE: no muscle or fat depletion, unable to assess edema.  Labs reviewed; CBGs 86-178, Cl 98, BUN 31, creat 2.22, Ca 8.3, GFR 26, phos 2.0.  Meds reviewed; ferrous sulfate.   Diet Order:  Diet 2 gram sodium Room service appropriate?: Yes; Fluid consistency:: Thin  Skin:  Reviewed, no issues  Last BM:  5/9  Height:   Ht Readings from Last 1 Encounters:  07/21/15 5\' 7"  (1.702 m)    Weight:   Wt Readings from Last 1 Encounters:  07/30/15 186 lb 4.6 oz (84.5 kg)    Ideal Body Weight:  62.3 kg  BMI:  Body mass index is 29.17 kg/(m^2).  Estimated Nutritional Needs:   Kcal:   1800-2000  Protein:  130-140   Fluid:  2L  EDUCATION NEEDS:   No education needs identified at this time  Geoffery Lyons, [place] Dietetic Intern Pager [phone]

## 2015-07-31 ENCOUNTER — Other Ambulatory Visit: Payer: Federal, State, Local not specified - PPO

## 2015-07-31 LAB — CBC
HEMATOCRIT: 25.6 % — AB (ref 39.0–52.0)
HEMOGLOBIN: 8.8 g/dL — AB (ref 13.0–17.0)
MCH: 32.4 pg (ref 26.0–34.0)
MCHC: 34.4 g/dL (ref 30.0–36.0)
MCV: 94.1 fL (ref 78.0–100.0)
Platelets: 130 10*3/uL — ABNORMAL LOW (ref 150–400)
RBC: 2.72 MIL/uL — ABNORMAL LOW (ref 4.22–5.81)
RDW: 15.7 % — AB (ref 11.5–15.5)
WBC: 21.3 10*3/uL — ABNORMAL HIGH (ref 4.0–10.5)

## 2015-07-31 LAB — RENAL FUNCTION PANEL
ALBUMIN: 2.2 g/dL — AB (ref 3.5–5.0)
ANION GAP: 16 — AB (ref 5–15)
Albumin: 2.3 g/dL — ABNORMAL LOW (ref 3.5–5.0)
Anion gap: 15 (ref 5–15)
BUN: 24 mg/dL — ABNORMAL HIGH (ref 6–20)
BUN: 29 mg/dL — AB (ref 6–20)
CALCIUM: 8 mg/dL — AB (ref 8.9–10.3)
CALCIUM: 8.4 mg/dL — AB (ref 8.9–10.3)
CHLORIDE: 97 mmol/L — AB (ref 101–111)
CO2: 21 mmol/L — AB (ref 22–32)
CO2: 22 mmol/L (ref 22–32)
CREATININE: 1.97 mg/dL — AB (ref 0.61–1.24)
Chloride: 97 mmol/L — ABNORMAL LOW (ref 101–111)
Creatinine, Ser: 1.9 mg/dL — ABNORMAL HIGH (ref 0.61–1.24)
GFR calc Af Amer: 35 mL/min — ABNORMAL LOW (ref >60)
GFR calc non Af Amer: 31 mL/min — ABNORMAL LOW (ref >60)
GFR calc non Af Amer: 30 mL/min — ABNORMAL LOW (ref >60)
GFR, EST AFRICAN AMERICAN: 36 mL/min — AB (ref >60)
GLUCOSE: 288 mg/dL — AB (ref 65–99)
GLUCOSE: 315 mg/dL — AB (ref 65–99)
PHOSPHORUS: 1.7 mg/dL — AB (ref 2.5–4.6)
POTASSIUM: 4 mmol/L (ref 3.5–5.1)
Phosphorus: 2.3 mg/dL — ABNORMAL LOW (ref 2.5–4.6)
Potassium: 3.9 mmol/L (ref 3.5–5.1)
SODIUM: 134 mmol/L — AB (ref 135–145)
Sodium: 134 mmol/L — ABNORMAL LOW (ref 135–145)

## 2015-07-31 LAB — GLUCOSE, CAPILLARY
GLUCOSE-CAPILLARY: 265 mg/dL — AB (ref 65–99)
Glucose-Capillary: 223 mg/dL — ABNORMAL HIGH (ref 65–99)
Glucose-Capillary: 129 mg/dL — ABNORMAL HIGH (ref 65–99)

## 2015-07-31 LAB — CARBOXYHEMOGLOBIN
CARBOXYHEMOGLOBIN: 1.8 % — AB (ref 0.5–1.5)
METHEMOGLOBIN: 0.7 % (ref 0.0–1.5)
O2 Saturation: 49.9 %
Total hemoglobin: 8.6 g/dL — ABNORMAL LOW (ref 13.5–18.0)

## 2015-07-31 LAB — MAGNESIUM: Magnesium: 2.4 mg/dL (ref 1.7–2.4)

## 2015-07-31 LAB — APTT: aPTT: 85 seconds — ABNORMAL HIGH (ref 24–37)

## 2015-07-31 LAB — HEPARIN LEVEL (UNFRACTIONATED): Heparin Unfractionated: 0.88 IU/mL — ABNORMAL HIGH (ref 0.30–0.70)

## 2015-07-31 MED ORDER — SODIUM CHLORIDE 0.9 % IV SOLN
250.0000 mg | Freq: Four times a day (QID) | INTRAVENOUS | Status: DC
  Administered 2015-07-31 – 2015-08-03 (×11): 250 mg via INTRAVENOUS
  Filled 2015-07-31 (×14): qty 250

## 2015-07-31 NOTE — Progress Notes (Addendum)
Pharmacy Antibiotic Note  Martin [name] is a 82 y.o. male started on vancomycin and ceftazidime yesterday for possible sepsis 2/2 UTI vs HCAP. Ceftaz changed to Zosyn for anaerobic coverage.  Now day #8 of abx for sepsis with unknown source. Afebrile, WBC up to 20 but did receive prednisone 40mg  x6 doses for gout recently.  He is more lethargic today and COOX dropped 70>52, will reculture and broaden ABX including adding antifungal coverage  With pt on CVVHD, will reduce imipenem dose.   Plan: Reduce imipenem to 250mg  IV q8h Continue vancomycin 1g IV Q24 at 2200  Continue anidulofungin 100mg  qd Monitor clinical picture, renal function, VT prn F/U C&S, abx deescalation / LOT    Height: 5\' 7"  (170.2 cm) Weight: 181 lb 7 oz (82.3 kg) IBW/kg (Calculated) : 66.1  Temp (24hrs), Avg:97.9 F (36.6 C), Min:97.6 F (36.4 C), Max:98.5 F (36.9 C)   Recent Labs Lab 07/27/15 0425  07/28/15 0410  07/29/15 0430 07/29/15 1636 07/29/15 1637 07/30/15 0215 07/30/15 2005 07/31/15 0450 07/31/15 1600  WBC 11.6*  --  10.9*  --  16.5*  --   --  20.1*  --  21.3*  --   CREATININE 4.03*  4.09*  < > 2.78*  2.78*  < > 2.23*  2.16* 2.27*  --  2.22*  2.23* 2.14* 1.90* 1.97*  VANCOTROUGH  --   --   --   --   --   --  24*  --   --   --   --   < > = values in this interval not displayed.  Estimated Creatinine Clearance: 29.7 mL/min (by C-G formula based on Cr of 1.97).    No Active Allergies  Antimicrobials this admission: 5/4 Vancomycin >> 5/4 Ceftazidime >> 5/5 5/5 Zosyn >>5/10 5/10 imipenem> 5/10 anidulofungin>  Dose adjustments this admission: 5/11: reduce imipenem to 250mg  q6h  Microbiology results: 5/4 urine >> insignificant growth 5/4 blood >>  ngF MRSA PCR negative 5/10 Bcx: ngtd   Andrey Cota. Diona Foley, PharmD, BCPS Clinical Pharmacist Pager [phone]  07/31/2015 6:18 PM

## 2015-07-31 NOTE — Progress Notes (Signed)
ANTICOAGULATION CONSULT NOTE - Follow Up Consult  Pharmacy Consult for Heparin  Indication: atrial fibrillation  No Active Allergies  Patient Measurements: Height: 5\' 7"  (170.2 cm) Weight: 181 lb 7 oz (82.3 kg) IBW/kg (Calculated) : 66.1  Vital Signs: Temp: 97.7 F (36.5 C) (05/11 0400) Temp Source: Oral (05/11 0400) BP: 132/70 mmHg (05/11 0700)  Labs:  Recent Labs  07/29/15 0430  07/29/15 1637 07/29/15 2210 07/30/15 0215 07/30/15 0844 07/30/15 2005 07/31/15 0450  HGB 9.4*  --  9.3*  --  9.1*  --   --  8.8*  HCT 27.5*  --  26.5*  --  26.3*  --   --  25.6*  PLT 138*  --   --   --  130*  --   --  130*  APTT  --   --   --  57*  --  69*  --  85*  HEPARINUNFRC  --   --   --   --   --  1.03*  --  0.88*  CREATININE 2.23*  2.16*  < >  --   --  2.22*  2.23*  --  2.14* 1.90*  < > = values in this interval not displayed.  Estimated Creatinine Clearance: 30.8 mL/min (by C-G formula based on Cr of 1.9).   Assessment: 82 yo M admitted with low output HF. Pt has chronic afib and was taking apixaban PTA. Apixaban was initially continued during admission, but it was stopped and changed to heparin on 5/6 when the decision was made to initiate CVVHD due to acute renal failure.  HL still slightly elevated at 0.88 today, so we will continue to use aPTTs to monitor until apixaban has washed out. Heparin drip 1050 uts/hr aPTT 85 at goal.  H/h low stable.    Goal of Therapy:  Heparin level 0.3-0.7 units/ml aPTT 66-102 seconds Monitor platelets by anticoagulation protocol: Yes   Plan:  Continue heparin to 1050 units/hr -Daily CBC/HL/aPTT -Monitor for bleeding  Uvaldo Rising, BCPS  Clinical Pharmacist Pager [phone]  07/31/2015 7:39 AM

## 2015-07-31 NOTE — Progress Notes (Signed)
Family updated.  Appreciate help of all.  One concern is a device infection. NOt a great candidate at present for extraction.  SOme pacing spikes seen.  Interrogate device.   Kerry Hough MD [facility] 9:42 AM

## 2015-07-31 NOTE — Progress Notes (Signed)
[facility] Rounding Note  Subjective/interval history:  No urine output Weight down another 2 kg to 84.5 kg Complains of gout pain in R wrist Remains on 2 pressors Nurses say slept 22 of the last 24 hours - he tells me "well you just sleep when you get the chance" Trying to stay positive mentally   Weight trending 07/31/15  82.3 kg  07/30/15  84.5 kg  07/29/15  86.4 kg  07/28/15   88.8 kg (  07/27/15   91 kg   07/26/15 0500 CRRT started 91.5 kg   07/25/15   91.8 kg     Objective: Vital signs in last 24 hours: Temp:  [97.6 F (36.4 C)-98.2 F (36.8 C)] 97.7 F (36.5 C) (05/11 0400) Pulse Rate:  [87-90] 87 (05/10 1600) Resp:  [17-28] 20 (05/11 0700) BP: (89-132)/(53-99) 132/70 mmHg (05/11 0700) SpO2:  [96 %-100 %] 100 % (05/11 0700) Weight:  [82.3 kg (181 lb 7 oz)] 82.3 kg (181 lb 7 oz) (05/11 0500) Weight change: -2.2 kg (-4 lb 13.6 oz)  Intake/Output from previous day: 05/10 0701 - 05/11 0700 In: 2429.5 [P.O.:170; I.V.:1185.5; IV Piggyback:1074] Out: 5255  Intake/Output this shift:    Physical examination Last CVP 9 General appearance: alert and cooperative. Seems more fatigued, but still appropriate Resp: diminished breath sounds bilaterally but anteriorly clear  Cardio: Irregular [id number] no murmur, click, rub or gallop Extremities: Trace edema LE's New left IJ HD cath (5/10) working well  Lab Results:  Recent Labs  07/30/15 0215 07/31/15 0450  WBC 20.1* 21.3*  HGB 9.1* 8.8*  HCT 26.3* 25.6*  PLT 130* 130*     Recent Labs  07/30/15 2005 07/31/15 0450  NA 136 134*  K 4.2 4.0  CL 99* 97*  CO2 22 21*  GLUCOSE 150* 288*  BUN 27* 24*  CREATININE 2.14* 1.90*  CALCIUM 8.8* 8.0*   PHOSPHORUS  Date Value Ref Range Status  07/31/2015 2.3* 2.5 - 4.6 mg/dL Final  07/30/2015 2.5 2.5 - 4.6 mg/dL Final  07/30/2015 2.0* 2.5 - 4.6 mg/dL Final  07/29/2015 2.2* 2.5 - 4.6 mg/dL Final  07/29/2015 2.6 2.5 - 4.6 mg/dL Final     Medications Scheduled: .  anidulafungin  100 mg Intravenous Q24H  . antiseptic oral rinse  7 mL Mouth Rinse BID  . atorvastatin  40 mg Oral q1800  . feeding supplement (ENSURE ENLIVE)  237 mL Oral BID BM  . feeding supplement (PRO-STAT SUGAR FREE 64)  30 mL Oral TID BM  . ferrous sulfate  325 mg Oral Q breakfast  . imipenem-cilastatin  500 mg Intravenous Q8H  . insulin aspart  0-15 Units Subcutaneous TID WC  . insulin aspart  0-5 Units Subcutaneous QHS  . sodium chloride flush  10-40 mL Intracatheter Q12H  . sodium chloride flush  3 mL Intravenous Q12H  . vancomycin  1,000 mg Intravenous Q24H  Infusions: . sodium chloride Stopped (07/29/15 1100)  . amiodarone 30 mg/hr (07/31/15 0159)  . heparin 1,050 Units/hr (07/30/15 1048)  . norepinephrine (LEVOPHED) Adult infusion 5 mcg/min (07/31/15 0351)  . dialysis replacement fluid (prismasate) 200 mL/hr at 07/31/15 0254  . dialysis replacement fluid (prismasate) 400 mL/hr at 07/31/15 0233  . dialysate (PRISMASATE) 1,000 mL/hr at 07/31/15 0708  . vasopressin (PITRESSIN) infusion - *FOR SHOCK* 0.03 Units/min (07/31/15 0205)   Dg Chest Port 1 View  07/30/2015  CLINICAL DATA:  Respiratory difficulty EXAM: PORTABLE CHEST 1 VIEW COMPARISON:  07/29/2015 FINDINGS: Left jugular dialysis catheter and right upper  extremity PICC are stable. Left subclavian AICD is stable. Diffuse edema has improved. Prominence of the central mediastinal vascular pedicle has improved. Mild cardiomegaly persists. No pneumothorax. IMPRESSION: Improved pulmonary edema. Electronically Signed   By: Marybelle Killings M.D.   On: 07/30/2015 09:13    Assessment/Plan:  1. AKI, hemodynamically mediated- AoCKD baseline creatinine 1.8.No evidence for recovery at this time. CRRT (started 5/6). Net neg 75-125/hour . CVP 9.  Weight down 9 kg. CXR improved but still pretty sig interstitial edema. All 4K fluids. K is fine, phos 2.3 after 10 mmole replacement on 5/10. We will continue pulling 75-125/hour for another 24  hours or so unless cards has different goals. No evidence kidney function return as of yet and I am pessimistic about the likelihood of any significant improvement given clinical situation.  2. Gross hematuria - voided blood 5/8 not much urine. Maybe bladder trauma and on heparin. Hb stable. No further voids 3. Shock  - Hypotensive with pressor dependence likely multi-factorial from CHF and sepsis. Pressors down some but still requiring norepin and vasopressin 4. Sepsis - ATB's changed to primaxin/vanco/anidulafungin - broadened d/t rising WBC, dropping co-x and more lethargy 5. AF with RVR - IV amio 6. Gout - R wrist - s/p pred 7. Prognosis - would seem grim. Continuing CRRT for now. Not felt to be a candidate for long term dialysis and family is aware.  Jamal Maes, MD [facility] [phone] Pager 07/31/2015, 7:39 AM

## 2015-07-31 NOTE — Progress Notes (Signed)
Advanced Heart Failure Rounding Note   Subjective:    Martin [name] Martin [name] [name] is an 82 year old Martin [name] [name] with h/o CAD s/p anterior MI in 1985 followed by CABG and PCI of RCA, chronic systolic HF EF 123456, chronic AF and CKD followed by Martin [name] Martin [name] [name]. We have been asked to consult for possible low output HF.   Over past few weeks has been struggling with progressive volume overload, fatigue and DOE. Saw Martin [name] Martin [name] [name] in Martin [name] office and was refractory to titration of oral diuretics so was admitted for IV diuresis. Did not respond well to IV lasix. Developed renal failure.  Moved to ICU due to hypotension. Urine with many bacteria. Developed urosepsis. Milrinone stopped norepi started.   Started on CVVHD 07/26/15.   HD cath replaced and now Martin [name] as of 07/29/15 2/2 leaking and mal-positioning.   Down another 5 lbs.  Remains anuric. WBC trending up 10.9 -> 16.5 -> 20.1->21 despite broad spectrum ABX .  Hgb 8.8. Today CO-OX 50%.   On norepi 5 and vasopressin 0.03. SBP 120s  UA- many bacteria. Insignificant growth UCx 07/24/15 - Insignificant growth Blood Cultures 07/24/15- NGTD    Objective:   Weight Range:  Vital Signs:   Temp:  [97.6 F (36.4 C)-98.2 F (36.8 C)] 97.7 F (36.5 C) (05/11 0400) Pulse Rate:  [87-90] 87 (05/10 1600) Resp:  [17-28] 20 (05/11 0700) BP: (89-132)/(53-99) 132/70 mmHg (05/11 0700) SpO2:  [96 %-100 %] 100 % (05/11 0700) Weight:  [181 lb 7 oz (82.3 kg)] 181 lb 7 oz (82.3 kg) (05/11 0500) Last BM Date: 07/29/15  Weight change: Filed Weights   07/29/15 0500 07/30/15 0359 07/31/15 0500  Weight: 190 lb 7.6 oz (86.4 kg) 186 lb 4.6 oz (84.5 kg) 181 lb 7 oz (82.3 kg)    Intake/Output:   Intake/Output Summary (Last 24 hours) at 07/31/15 0740 Last data filed at 07/31/15 0700  Gross per 24 hour  Intake 2429.53 ml  Output   5255 ml  Net -2825.47 ml     Physical Exam: CVP 10 General: Martin [name] Martin [name] [name]. Lying in bed NAD. Fatigued.  HEENT: normal Neck: supple. Martin [name] trialysis cath. JVP 9-10   Carotids 2+ bilat; no bruits. No thyromegaly or nodule noted.  Cor: PMI laterally displaced. IRR  Lungs: Scattered rhonchi that clear with cough. On 2 L via [place]. Abdomen: soft, non-tender, non-distended, no HSM. No bruits or masses. +BS  Extremities: no cyanosis, clubbing, rash, trace ankle edema only Neuro: alert & orientedx3, cranial nerves grossly intact. moves all 4 extremities w/o difficulty. Affect pleasant  Telemetry: Reviewed personally, AF 90s  Labs: Basic Metabolic Panel:  Recent Labs Lab 07/27/15 0425  07/28/15 0410  07/29/15 0430 07/29/15 1636 07/30/15 0215 07/30/15 2005 07/31/15 0450  NA 127*  127*  < > 131*  131*  < > 132*  133* 135 136  136 136 134*  K 4.5  4.5  < > 4.3  4.3  < > 4.3  4.1 4.0 4.1  4.1 4.2 4.0  CL 91*  91*  < > 95*  95*  < > 95*  97* 99* 98*  98* 99* 97*  CO2 19*  19*  < > 22  22  < > 22  21* 24 23  23 22  21*  GLUCOSE 236*  238*  < > 241*  240*  < > 228*  227* 182* 161*  161* 150* 288*  BUN 80*  80*  < > 48*  48*  < > 36*  34*  34* 31*  30* 27* 24*  CREATININE 4.03*  4.09*  < > 2.78*  2.78*  < > 2.23*  2.16* 2.27* 2.22*  2.23* 2.14* 1.90*  CALCIUM 8.1*  8.1*  < > 8.1*  8.0*  < > 8.3*  7.9* 8.3* 8.3*  8.3* 8.8* 8.0*  MG 2.3  --  2.4  --  2.5*  --  2.4  --  2.4  PHOS 4.7*  < > 3.2  < > 2.6 2.2* 2.0* 2.5 2.3*  < > = values in this interval not displayed.  Liver Function Tests:  Recent Labs Lab 07/29/15 0430 07/29/15 1636 07/30/15 0215 07/30/15 2005 07/31/15 0450  ALBUMIN 2.3* 2.5* 2.4* 2.4* 2.3*   No results for input(s): LIPASE, AMYLASE in [place] [name] last 168 hours. No results for input(s): AMMONIA in [place] [name] last 168 hours.  CBC:  Recent Labs Lab 07/24/15 1129 07/25/15 0410  07/27/15 0425 07/28/15 0410 07/29/15 0218 07/29/15 0430 07/29/15 1637 07/30/15 0215 07/31/15 0450  WBC 11.8* 15.0*  < > 11.6* 10.9*  --  16.5*  --  20.1* 21.3*  NEUTROABS 10.5* 14.1*  --   --   --   --   --   --   --   --   HGB 8.1*  8.8*  < > 9.9* 9.0* 9.5* 9.4* 9.3* 9.1* 8.8*  HCT 24.6* 27.0*  < > 28.2* 25.9* 27.1* 27.5* 26.5* 26.3* 25.6*  MCV 101.2* 101.9*  < > 94.6 94.9  --  95.5  --  94.3 94.1  PLT 75* 97*  < > 188 122*  --  138*  --  130* 130*  < > = values in this interval not displayed.  Cardiac Enzymes: No results for input(s): CKTOTAL, CKMB, CKMBINDEX, TROPONINI in Martin [name] last 168 hours.  BNP: BNP (last 3 results)  Recent Labs  07/21/15 1838  BNP 1660.5*    ProBNP (last 3 results) No results for input(s): PROBNP in Martin [name] last 8760 hours.    Other results:  Imaging: Dg Chest Port 1 View  07/30/2015  CLINICAL DATA:  Respiratory difficulty EXAM: PORTABLE CHEST 1 VIEW COMPARISON:  07/29/2015 FINDINGS: Left jugular dialysis catheter and right upper extremity PICC are stable. Left subclavian AICD is stable. Diffuse edema has improved. Prominence of Martin [name] central mediastinal vascular pedicle has improved. Mild cardiomegaly persists. No pneumothorax. IMPRESSION: Improved pulmonary edema. Electronically Signed   By: Marybelle Killings M.D.   On: 07/30/2015 09:13   Dg Chest Port 1 View  07/29/2015  CLINICAL DATA:  Central catheter placement EXAM: PORTABLE CHEST 1 VIEW COMPARISON:  Jul 26, 2015 FINDINGS: Martin [name] right jugular catheter has been removed. Left jugular catheter tip is in Martin [name] superior vena cava. A Martin [name] right subclavian catheter tip is also in Martin [name] superior vena cava. No pneumothorax. Pacemaker lead is attached to Martin [name] right ventricle. There is cardiomegaly with pulmonary venous hypertension. There is interstitial edema with bilateral effusions. No adenopathy is evident. There are surgical clips in Martin [name] left axillary region. IMPRESSION: Central catheter tips are in Martin [name] superior vena cava. No pneumothorax. Evidence of congestive heart failure. Effusions appear slightly larger currently compared to recent prior study. Electronically Signed   By: Lowella Grip III M.D.   On: 07/29/2015 13:30     Medications:      Scheduled Medications: . anidulafungin  100 mg Intravenous Q24H  . antiseptic oral rinse  7 mL Mouth Rinse BID  . atorvastatin  40 mg Oral q1800  . feeding supplement (ENSURE ENLIVE)  237 mL Oral BID BM  . feeding supplement (PRO-STAT SUGAR FREE 64)  30 mL Oral TID BM  . ferrous sulfate  325 mg Oral Q breakfast  . imipenem-cilastatin  500 mg Intravenous Q8H  . insulin aspart  0-15 Units Subcutaneous TID WC  . insulin aspart  0-5 Units Subcutaneous QHS  . sodium chloride flush  10-40 mL Intracatheter Q12H  . sodium chloride flush  3 mL Intravenous Q12H  . vancomycin  1,000 mg Intravenous Q24H    Infusions: . sodium chloride Stopped (07/29/15 1100)  . amiodarone 30 mg/hr (07/31/15 0159)  . heparin 1,050 Units/hr (07/30/15 1048)  . norepinephrine (LEVOPHED) Adult infusion 5 mcg/min (07/31/15 0351)  . dialysis replacement fluid (prismasate) 200 mL/hr at 07/31/15 0254  . dialysis replacement fluid (prismasate) 400 mL/hr at 07/31/15 0233  . dialysate (PRISMASATE) 1,000 mL/hr at 07/31/15 0708  . vasopressin (PITRESSIN) infusion - *FOR SHOCK* 0.03 Units/min (07/31/15 0205)    PRN Medications: sodium chloride, acetaminophen, alteplase, fentaNYL (SUBLIMAZE) injection, guaiFENesin, heparin, heparin, heparin, levalbuterol, ondansetron (ZOFRAN) IV, phenol, sodium chloride flush, sodium chloride flush, traMADol   Assessment:   1.Urosepsis  2. Acute on chronic systolic HF  2. Chronic AF now with RVR 3. Ischemic CM EF 20-25% 4. Acute on chronic renal failure, stage IV -> CVVHD started 5/6. CAD s/p anterior MI followed by CABG and PCI RCA 6. Acute respiratory failure 7. Hypotension 8. Hypokalemia 9. Acute Gout Flare    Plan/Discussion:    Has severe sepsis with worsening renal failure. Remains anuric on CVVHD. Remains norepi weaning as tolerated, currently at 5 mcg.     On vancomycin and imipenem + diflucan. Cultures redrawn 5/10.  WBC continues to trend up.    Remains on amio  for rate control of chronic AF. HR stable in 90s. Off apixaban for now and on heparin.   CO-OX 50%.  On Norepi. BP ok. May need to add milrinone . Volume removal ongoing with CVVHD.  CVP ~9-10  If no renal recover in 1-2 weeks will need Hospice. He is not candidate for long-term HD with severe HF.   Amy Clegg NP-C  7:40 AM Advanced Heart Failure Team Pager [phone] (M-F; 7a - 4p)  Please contact [facility] for night-coverage after hours (4p -7a ) and weekends on [email or web address]  Martin [name] [name] seen and examined with Darrick Grinder, NP. We discussed all aspects of Martin [name] encounter. I agree with Martin [name] assessment and plan as stated above.   Remains critically ill. Anuric. Abx changed yesterday. Also now with fungal coverage. WBC up slightly again. Remains on vasopressin and norepi. Co-ox 50%. CVP 9-10. Weight down 21 pounds.   Would stop vasopressin for now. Use norepi +/- milrinone as BP tolerates. Would keep fluid even.   Continue amio/heparin.. Prognosis increasingly poor.   Martin [name] Martin [name] [name] is critically ill with multiple organ systems failure and requires high complexity decision making for assessment and support, frequent evaluation and titration of therapies, application of advanced monitoring technologies and extensive interpretation of multiple databases.   Critical Care Time devoted to Martin [name] [name] care services described in this note is 35 Minutes.  Ashanti Ratti,MD 8:12 AM

## 2015-08-01 LAB — GLUCOSE, CAPILLARY
GLUCOSE-CAPILLARY: 159 mg/dL — AB (ref 65–99)
GLUCOSE-CAPILLARY: 118 mg/dL — AB (ref 65–99)
GLUCOSE-CAPILLARY: 168 mg/dL — AB (ref 65–99)
GLUCOSE-CAPILLARY: 175 mg/dL — AB (ref 65–99)
Glucose-Capillary: 151 mg/dL — ABNORMAL HIGH (ref 65–99)

## 2015-08-01 LAB — RENAL FUNCTION PANEL
ANION GAP: 14 (ref 5–15)
ANION GAP: 13 (ref 5–15)
Albumin: 2.1 g/dL — ABNORMAL LOW (ref 3.5–5.0)
Albumin: 2.1 g/dL — ABNORMAL LOW (ref 3.5–5.0)
BUN: 29 mg/dL — AB (ref 6–20)
BUN: 30 mg/dL — ABNORMAL HIGH (ref 6–20)
CALCIUM: 8.4 mg/dL — AB (ref 8.9–10.3)
CALCIUM: 7.7 mg/dL — AB (ref 8.9–10.3)
CO2: 22 mmol/L (ref 22–32)
CO2: 22 mmol/L (ref 22–32)
CREATININE: 1.89 mg/dL — AB (ref 0.61–1.24)
Chloride: 97 mmol/L — ABNORMAL LOW (ref 101–111)
Chloride: 100 mmol/L — ABNORMAL LOW (ref 101–111)
Creatinine, Ser: 1.84 mg/dL — ABNORMAL HIGH (ref 0.61–1.24)
GFR calc Af Amer: 38 mL/min — ABNORMAL LOW (ref >60)
GFR calc non Af Amer: 31 mL/min — ABNORMAL LOW (ref >60)
GFR calc non Af Amer: 32 mL/min — ABNORMAL LOW (ref >60)
GFR, EST AFRICAN AMERICAN: 36 mL/min — AB (ref >60)
GLUCOSE: 222 mg/dL — AB (ref 65–99)
Glucose, Bld: 233 mg/dL — ABNORMAL HIGH (ref 65–99)
Phosphorus: 2.2 mg/dL — ABNORMAL LOW (ref 2.5–4.6)
Phosphorus: 1.9 mg/dL — ABNORMAL LOW (ref 2.5–4.6)
Potassium: 3.9 mmol/L (ref 3.5–5.1)
Potassium: 4.1 mmol/L (ref 3.5–5.1)
SODIUM: 133 mmol/L — AB (ref 135–145)
SODIUM: 135 mmol/L (ref 135–145)

## 2015-08-01 LAB — BASIC METABOLIC PANEL
ANION GAP: 15 (ref 5–15)
BUN: 29 mg/dL — ABNORMAL HIGH (ref 6–20)
CHLORIDE: 98 mmol/L — AB (ref 101–111)
CO2: 22 mmol/L (ref 22–32)
Calcium: 8.4 mg/dL — ABNORMAL LOW (ref 8.9–10.3)
Creatinine, Ser: 1.9 mg/dL — ABNORMAL HIGH (ref 0.61–1.24)
GFR calc Af Amer: 36 mL/min — ABNORMAL LOW (ref >60)
GFR calc non Af Amer: 31 mL/min — ABNORMAL LOW (ref >60)
GLUCOSE: 237 mg/dL — AB (ref 65–99)
POTASSIUM: 3.9 mmol/L (ref 3.5–5.1)
Sodium: 135 mmol/L (ref 135–145)

## 2015-08-01 LAB — MAGNESIUM: Magnesium: 2.4 mg/dL (ref 1.7–2.4)

## 2015-08-01 LAB — CBC
HEMATOCRIT: 26 % — AB (ref 39.0–52.0)
HEMOGLOBIN: 8.9 g/dL — AB (ref 13.0–17.0)
MCH: 32.4 pg (ref 26.0–34.0)
MCHC: 34.2 g/dL (ref 30.0–36.0)
MCV: 94.5 fL (ref 78.0–100.0)
Platelets: 148 10*3/uL — ABNORMAL LOW (ref 150–400)
RBC: 2.75 MIL/uL — ABNORMAL LOW (ref 4.22–5.81)
RDW: 16.1 % — ABNORMAL HIGH (ref 11.5–15.5)
WBC: 22.1 10*3/uL — ABNORMAL HIGH (ref 4.0–10.5)

## 2015-08-01 LAB — CARBOXYHEMOGLOBIN
Carboxyhemoglobin: 1.3 % (ref 0.5–1.5)
Methemoglobin: 0.9 % (ref 0.0–1.5)
O2 SAT: 57.5 %
Total hemoglobin: 9.4 g/dL — ABNORMAL LOW (ref 13.5–18.0)

## 2015-08-01 LAB — APTT
APTT: 60 s — AB (ref 24–37)
aPTT: 74 seconds — ABNORMAL HIGH (ref 24–37)

## 2015-08-01 MED ORDER — DEXTROSE 5 % IV SOLN
20.0000 mmol | Freq: Once | INTRAVENOUS | Status: AC
Start: 2015-08-01 — End: 2015-08-02
  Administered 2015-08-02: 20 mmol via INTRAVENOUS
  Filled 2015-08-01: qty 6.67

## 2015-08-01 MED ORDER — HEPARIN (PORCINE) IN NACL 100-0.45 UNIT/ML-% IJ SOLN
1100.0000 [IU]/h | INTRAMUSCULAR | Status: DC
  Administered 2015-08-02 – 2015-08-04 (×3): 1100 [IU]/h via INTRAVENOUS
  Filled 2015-08-01 (×4): qty 250

## 2015-08-01 NOTE — Progress Notes (Signed)
Advanced Heart Failure Rounding Note   Subjective:    Martin [name] is an 82 year old male with h/o CAD s/p anterior MI in 1985 followed by CABG and PCI of RCA, chronic systolic HF EF 123456, chronic AF and CKD followed by Dr. Wynonia Lawman. We have been asked to consult for possible low output HF.   Over past few weeks has been struggling with progressive volume overload, fatigue and DOE. Saw Dr. Wynonia Lawman in the office and was refractory to titration of oral diuretics so was admitted for IV diuresis. Did not respond well to IV lasix. Developed renal failure.  Moved to ICU due to hypotension. Urine with many bacteria. Developed urosepsis. Milrinone stopped norepi started.   Started on CVVHD 07/26/15.   HD cath replaced and now [facility] as of 07/29/15 2/2 leaking and mal-positioning.   Weight shows down a lb on CVVH.  He remains anuric. WBC trending up 10.9->16.5->20.1->21->22.1  despite broadening of ABX and addition of fungal coverage.  Hgb 8.9 Today CO-OX 57.5%.     He looks more fatigued today, but denies SOB, CP, lightheadedness, or dizziness.  Continues to have discomfort from his gout.   Norepi back up to 8. Off vasopressin. SBP 100-110s  UA- many bacteria. Insignificant growth UCx 07/24/15 - Insignificant growth Blood Cultures 07/24/15- No growth Blood Cultures 07/30/15 - NGTD   Objective:   Weight Range:  Vital Signs:   Temp:  [97.6 F (36.4 C)-98.5 F (36.9 C)] 97.6 F (36.4 C) (05/12 0400) Pulse Rate:  [87] 87 (05/11 0800) Resp:  [13-28] 23 (05/12 0300) BP: (87-132)/(49-107) 122/67 mmHg (05/12 0300) SpO2:  [95 %-100 %] 100 % (05/12 0300) Weight:  [180 lb 12.4 oz (82 kg)] 180 lb 12.4 oz (82 kg) (05/12 0410) Last BM Date: 07/29/15  Weight change: Filed Weights   07/30/15 0359 07/31/15 0500 08/01/15 0410  Weight: 186 lb 4.6 oz (84.5 kg) 181 lb 7 oz (82.3 kg) 180 lb 12.4 oz (82 kg)    Intake/Output:   Intake/Output Summary (Last 24 hours) at 08/01/15 0716 Last data filed at 08/01/15  0700  Gross per 24 hour  Intake 1961.45 ml  Output   2348 ml  Net -386.55 ml     Physical Exam: CVP 6-7 General: Elderly male. Lying in bed NAD. Fatigued.  HEENT: normal Neck: supple. LIJ trialysis cath. JVP does appear slightly more elevated than per CVP, closer to 9-10, Carotids 2+ bilat; no bruits. No thyromegaly or nodule noted.  Cor: PMI laterally displaced. IRR  Lungs: Scattered rhonchi. On 2 L via [place]. Abdomen: soft, NT, ND, no HSM. No bruits or masses. +BS , no HSM. No bruits or masses. +BS  Extremities: no cyanosis, clubbing, rash, trace ankle edema only Neuro: alert & orientedx3, cranial nerves grossly intact. moves all 4 extremities w/o difficulty. Affect pleasant  Telemetry: Reviewed personally, AF 90s  Labs: Basic Metabolic Panel:  Recent Labs Lab 07/28/15 0410  07/29/15 0430  07/30/15 0215 07/30/15 2005 07/31/15 0450 07/31/15 1600 08/01/15 0410  NA 131*  131*  < > 132*  133*  < > 136  136 136 134* 134* 133*  135  K 4.3  4.3  < > 4.3  4.1  < > 4.1  4.1 4.2 4.0 3.9 3.9  3.9  CL 95*  95*  < > 95*  97*  < > 98*  98* 99* 97* 97* 97*  98*  CO2 22  22  < > 22  21*  < > 23  23 22 21* 22 22  22   GLUCOSE 241*  240*  < > 228*  227*  < > 161*  161* 150* 288* 315* 233*  237*  BUN 48*  48*  < > 36*  34*  < > 31*  30* 27* 24* 29* 29*  29*  CREATININE 2.78*  2.78*  < > 2.23*  2.16*  < > 2.22*  2.23* 2.14* 1.90* 1.97* 1.89*  1.90*  CALCIUM 8.1*  8.0*  < > 8.3*  7.9*  < > 8.3*  8.3* 8.8* 8.0* 8.4* 8.4*  8.4*  MG 2.4  --  2.5*  --  2.4  --  2.4  --  2.4  PHOS 3.2  < > 2.6  < > 2.0* 2.5 2.3* 1.7* 2.2*  < > = values in this interval not displayed.  Liver Function Tests:  Recent Labs Lab 07/30/15 0215 07/30/15 2005 07/31/15 0450 07/31/15 1600 08/01/15 0410  ALBUMIN 2.4* 2.4* 2.3* 2.2* 2.1*   No results for input(s): LIPASE, AMYLASE in the last 168 hours. No results for input(s): AMMONIA in the last 168 hours.  CBC:  Recent Labs Lab  07/28/15 0410  07/29/15 0430 07/29/15 1637 07/30/15 0215 07/31/15 0450 08/01/15 0410  WBC 10.9*  --  16.5*  --  20.1* 21.3* 22.1*  HGB 9.0*  < > 9.4* 9.3* 9.1* 8.8* 8.9*  HCT 25.9*  < > 27.5* 26.5* 26.3* 25.6* 26.0*  MCV 94.9  --  95.5  --  94.3 94.1 94.5  PLT 122*  --  138*  --  130* 130* 148*  < > = values in this interval not displayed.  Cardiac Enzymes: No results for input(s): CKTOTAL, CKMB, CKMBINDEX, TROPONINI in the last 168 hours.  BNP: BNP (last 3 results)  Recent Labs  07/21/15 1838  BNP 1660.5*    ProBNP (last 3 results) No results for input(s): PROBNP in the last 8760 hours.    Other results:  Imaging: Dg Chest Port 1 View  07/30/2015  CLINICAL DATA:  Respiratory difficulty EXAM: PORTABLE CHEST 1 VIEW COMPARISON:  07/29/2015 FINDINGS: Left jugular dialysis catheter and right upper extremity PICC are stable. Left subclavian AICD is stable. Diffuse edema has improved. Prominence of the central mediastinal vascular pedicle has improved. Mild cardiomegaly persists. No pneumothorax. IMPRESSION: Improved pulmonary edema. Electronically Signed   By: Marybelle Killings M.D.   On: 07/30/2015 09:13     Medications:     Scheduled Medications: . anidulafungin  100 mg Intravenous Q24H  . antiseptic oral rinse  7 mL Mouth Rinse BID  . atorvastatin  40 mg Oral q1800  . feeding supplement (ENSURE ENLIVE)  237 mL Oral BID BM  . feeding supplement (PRO-STAT SUGAR FREE 64)  30 mL Oral TID BM  . ferrous sulfate  325 mg Oral Q breakfast  . imipenem-cilastatin  250 mg Intravenous Q6H  . insulin aspart  0-15 Units Subcutaneous TID WC  . insulin aspart  0-5 Units Subcutaneous QHS  . sodium chloride flush  10-40 mL Intracatheter Q12H  . sodium chloride flush  3 mL Intravenous Q12H  . vancomycin  1,000 mg Intravenous Q24H    Infusions: . sodium chloride Stopped (07/29/15 1100)  . amiodarone 30 mg/hr (08/01/15 0512)  . heparin 1,050 Units/hr (07/31/15 1900)  . norepinephrine  (LEVOPHED) Adult infusion 8 mcg/min (08/01/15 0409)  . dialysis replacement fluid (prismasate) 200 mL/hr at 08/01/15 0409  . dialysis replacement fluid (prismasate) 400 mL/hr at 08/01/15 0409  . dialysate (PRISMASATE)  1,000 mL/hr at 08/01/15 0324    PRN Medications: sodium chloride, acetaminophen, alteplase, fentaNYL (SUBLIMAZE) injection, guaiFENesin, heparin, heparin, heparin, levalbuterol, ondansetron (ZOFRAN) IV, phenol, sodium chloride flush, sodium chloride flush, traMADol   Assessment:   1.Urosepsis  2. Acute on chronic systolic HF  2. Chronic AF now with RVR 3. Ischemic CM EF 20-25% 4. Acute on chronic renal failure, stage IV -> CVVHD started 5/6. CAD s/p anterior MI followed by CABG and PCI RCA 6. Acute respiratory failure 7. Hypotension 8. Hypokalemia 9. Acute Gout Flare    Plan/Discussion:    Has severe sepsis with worsening renal failure. Remains anuric on CVVHD. Remains norepi weaning as tolerated, currently at 5 mcg.     On vancomycin and imipenem + diflucan. Cultures redrawn 5/10.  WBC still trending up. No fever.     Remains on amio for rate control of chronic AF. HR stable in 90s. Off apixaban for now and on heparin.   CO-OX 57.5%.  On Norepi. BP ok. May need to add milrinone . Volume currently being kept even with CVVHD in hopes it would stimulated urine production. .  CVP ~6-7  Prognosis increasingly poor. May consider milrinone for additional support, but unlikely to improve prognosis at this time.   If no renal recover in 1-2 weeks will need Hospice. He is not candidate for long-term HD with severe HF.   Satira Mccallum Tillery PA-C  7:16 AM Advanced Heart Failure Team Pager [phone] (M-F; 7a - 4p)  Please contact [facility] for night-coverage after hours (4p -7a ) and weekends on [email or web address]   Patient seen and examined with Oda Kilts, PA-C. We discussed all aspects of the encounter. I agree with the assessment and plan as stated above.   Co-ox  back up. Now on norepi only. Off vasopressin. WBC still trending up despite expanded coverage. No fever. BCx negative. Will continue CVVHD through the weekend. If no urine output by Monday will need to address possible end-of-life issues. He realizes he is not candidate for outpatient dialysis.   The patient is critically ill with multiple organ systems failure and requires high complexity decision making for assessment and support, frequent evaluation and titration of therapies, application of advanced monitoring technologies and extensive interpretation of multiple databases.   Critical Care Time devoted to patient care services described in this note is 35 Minutes.  Janyah Singleterry,MD 8:29 AM

## 2015-08-01 NOTE — Progress Notes (Signed)
SO much appreciate help of CHF team and Renal. Discussed with patient and family and answered questions.    Kerry Hough MD [facility] 1:54 PM

## 2015-08-01 NOTE — Progress Notes (Signed)
ANTICOAGULATION CONSULT NOTE - Follow Up Consult  Pharmacy Consult for Heparin  Indication: atrial fibrillation  No Known Allergies  Patient Measurements: Height: 5\' 7"  (170.2 cm) Weight: 180 lb 12.4 oz (82 kg) IBW/kg (Calculated) : 66.1  Vital Signs: Temp: 97.4 F (36.3 C) (05/12 1200) Temp Source: Oral (05/12 1200) BP: 172/146 mmHg (05/12 1430)  Labs:  Recent Labs  07/30/15 0215 07/30/15 0844  07/31/15 0450 07/31/15 1600 08/01/15 0410 08/01/15 1515  HGB 9.1*  --   --  8.8*  --  8.9*  --   HCT 26.3*  --   --  25.6*  --  26.0*  --   PLT 130*  --   --  130*  --  148*  --   APTT  --  69*  --  85*  --  60* 74*  HEPARINUNFRC  --  1.03*  --  0.88*  --   --   --   CREATININE 2.22*  2.23*  --   < > 1.90* 1.97* 1.89*  1.90*  --   < > = values in this interval not displayed.  Estimated Creatinine Clearance: 30.7 mL/min (by C-G formula based on Cr of 1.9).   Assessment: 82 yo M admitted with low output HF. Pt has chronic afib and was taking apixaban PTA. Apixaban was initially continued during admission, but it was stopped and changed to heparin on 5/6 when the decision was made to initiate CVVHD due to acute renal failure.  Repeat aPTT is now therapeutic at 74 sec on heparin 1100 units/hr. No issues with infusion or bleeding noted.  Goal of Therapy:  Heparin level 0.3-0.7 units/ml aPTT 66-102 seconds Monitor platelets by anticoagulation protocol: Yes   Plan:  Continue heparin to 1100 units/hr Daily CBC/HL/aPTT Monitor for bleeding  Andrey Cota. Diona Foley, PharmD, BCPS Clinical Pharmacist Pager [phone] 08/01/2015 3:52 PM

## 2015-08-01 NOTE — Progress Notes (Signed)
frequent pvcs noted -Phosphorus  Level 1.9 for the 4 pm draw,notified Dr Justin Mend w/ [facility] ordered.

## 2015-08-01 NOTE — Progress Notes (Signed)
[facility] Rounding Note  Subjective/interval history:  Remains without urine output Keeping volume even now BP about the same He seems to be convinced that he is putting out "good amounts of urine" when in fact only 5 cc recorded past 3 days, and with foley in minimal amounts of dirty brown ATN looking urine   Weight trending 08/01/15                        82 kg 07/31/15  82.3 kg  07/30/15  84.5 kg  07/29/15  86.4 kg  07/28/15   88.8 kg (  07/27/15   91 kg   07/26/15 0500 CRRT started 91.5 kg   07/25/15   91.8 kg     Objective: Vital signs in last 24 hours: Temp:  [97.6 F (36.4 C)-98.5 F (36.9 C)] 97.6 F (36.4 C) (05/12 0400) Resp:  [13-28] 23 (05/12 0300) BP: (87-132)/(49-107) 122/67 mmHg (05/12 0300) SpO2:  [95 %-100 %] 100 % (05/12 0300) Weight:  [82 kg (180 lb 12.4 oz)] 82 kg (180 lb 12.4 oz) (05/12 0410) Weight change: -0.3 kg (-10.6 oz)  Intake/Output from previous day: 05/11 0701 - 05/12 0700 In: 1961.5 [P.O.:480; I.V.:851.5; IV Piggyback:630] Out: 2348 [Urine:5] Intake/Output this shift:   Physical examination CVP 8 Remains more fatigued appearing, but still appropriate Resp: diminished breath sounds bilaterally but anteriorly clear  Cardio: Irregular [id number] no murmur, click, rub or gallop Extremities: Trace edema LE's Left IJ HD cath (5/10) working well 3-4 cc dirty brown looking urine in foley  Lab Results:  Recent Labs  07/31/15 0450 08/01/15 0410  WBC 21.3* 22.1*  HGB 8.8* 8.9*  HCT 25.6* 26.0*  PLT 130* 148*     Recent Labs  07/31/15 1600 08/01/15 0410  NA 134* 133*  135  K 3.9 3.9  3.9  CL 97* 97*  98*  CO2 22 22  22   GLUCOSE 315* 233*  237*  BUN 29* 29*  29*  CREATININE 1.97* 1.89*  1.90*  CALCIUM 8.4* 8.4*  8.4*   PHOSPHORUS  Date Value Ref Range Status  08/01/2015 2.2* 2.5 - 4.6 mg/dL Final  07/31/2015 1.7* 2.5 - 4.6 mg/dL Final  07/31/2015 2.3* 2.5 - 4.6 mg/dL Final  07/30/2015 2.5 2.5 - 4.6 mg/dL Final  07/30/2015  2.0* 2.5 - 4.6 mg/dL Final     Medications Scheduled: . anidulafungin  100 mg Intravenous Q24H  . antiseptic oral rinse  7 mL Mouth Rinse BID  . atorvastatin  40 mg Oral q1800  . feeding supplement (ENSURE ENLIVE)  237 mL Oral BID BM  . feeding supplement (PRO-STAT SUGAR FREE 64)  30 mL Oral TID BM  . ferrous sulfate  325 mg Oral Q breakfast  . imipenem-cilastatin  250 mg Intravenous Q6H  . insulin aspart  0-15 Units Subcutaneous TID WC  . insulin aspart  0-5 Units Subcutaneous QHS  . sodium chloride flush  10-40 mL Intracatheter Q12H  . sodium chloride flush  3 mL Intravenous Q12H  . vancomycin  1,000 mg Intravenous Q24H  Infusions: . sodium chloride Stopped (07/29/15 1100)  . amiodarone 30 mg/hr (08/01/15 0512)  . heparin 1,100 Units/hr (08/01/15 0745)  . norepinephrine (LEVOPHED) Adult infusion 8 mcg/min (08/01/15 0409)  . dialysis replacement fluid (prismasate) 200 mL/hr at 08/01/15 0409  . dialysis replacement fluid (prismasate) 400 mL/hr at 08/01/15 0409  . dialysate (PRISMASATE) 1,000 mL/hr at 08/01/15 0324   No results found.  Assessment/Plan:  1. AKI,  hemodynamically mediated- AoCKD baseline creatinine 1.8.No evidence for recovery at this time. CRRT (started 5/6). Currently keeping volume even. CVP 8.  Weight down 9 kg total and stable past 24 hours.  All 4K fluids. K is fine, phos OK. S/p  replacement on 5/10. No evidence kidney function return as of yet and I am more and more pessimistic about the likelihood of any significant improvement given clinical situation. ? How long we plan to continue this level of support. 2. Gross hematuria - voided blood 5/8 not much urine. Maybe bladder trauma and on heparin. Hb stable. No further voids although he seems to think that he is making urine... 3. Shock  - Hypotensive with pressor dependence likely multi-factorial from CHF and sepsis. Pressors down some but still requiring norepi 4. Sepsis - ATB's changed to  primaxin/vanco/anidulafungin - broadened d/t rising WBC, dropping co-x and more lethargy 5. AF with RVR - IV amio 6. Gout - R wrist - s/p pred 7. Prognosis - would seem progressively grim. Continuing CRRT for now at cardiology request. Not a candidate for long term dialysis and family is aware.  Jamal Maes, MD [facility] [phone] Pager 08/01/2015, 8:03 AM

## 2015-08-01 NOTE — Progress Notes (Signed)
CRRT off from 1500-1600 to change out filter due to filter expiration.

## 2015-08-02 LAB — RENAL FUNCTION PANEL
ALBUMIN: 1.9 g/dL — AB (ref 3.5–5.0)
Albumin: 2 g/dL — ABNORMAL LOW (ref 3.5–5.0)
Anion gap: 13 (ref 5–15)
Anion gap: 11 (ref 5–15)
BUN: 28 mg/dL — AB (ref 6–20)
BUN: 28 mg/dL — AB (ref 6–20)
CHLORIDE: 99 mmol/L — AB (ref 101–111)
CHLORIDE: 98 mmol/L — AB (ref 101–111)
CO2: 23 mmol/L (ref 22–32)
CO2: 22 mmol/L (ref 22–32)
CREATININE: 1.65 mg/dL — AB (ref 0.61–1.24)
CREATININE: 1.68 mg/dL — AB (ref 0.61–1.24)
Calcium: 8.2 mg/dL — ABNORMAL LOW (ref 8.9–10.3)
Calcium: 7.8 mg/dL — ABNORMAL LOW (ref 8.9–10.3)
GFR calc Af Amer: 43 mL/min — ABNORMAL LOW (ref >60)
GFR calc Af Amer: 42 mL/min — ABNORMAL LOW (ref >60)
GFR calc non Af Amer: 36 mL/min — ABNORMAL LOW (ref >60)
GFR, EST NON AFRICAN AMERICAN: 37 mL/min — AB (ref >60)
GLUCOSE: 270 mg/dL — AB (ref 65–99)
Glucose, Bld: 200 mg/dL — ABNORMAL HIGH (ref 65–99)
POTASSIUM: 4 mmol/L (ref 3.5–5.1)
POTASSIUM: 3.9 mmol/L (ref 3.5–5.1)
Phosphorus: 3.5 mg/dL (ref 2.5–4.6)
Phosphorus: 2.2 mg/dL — ABNORMAL LOW (ref 2.5–4.6)
Sodium: 135 mmol/L (ref 135–145)
Sodium: 131 mmol/L — ABNORMAL LOW (ref 135–145)

## 2015-08-02 LAB — CBC
HCT: 26.1 % — ABNORMAL LOW (ref 39.0–52.0)
Hemoglobin: 8.8 g/dL — ABNORMAL LOW (ref 13.0–17.0)
MCH: 32.4 pg (ref 26.0–34.0)
MCHC: 33.7 g/dL (ref 30.0–36.0)
MCV: 96 fL (ref 78.0–100.0)
PLATELETS: 133 10*3/uL — AB (ref 150–400)
RBC: 2.72 MIL/uL — AB (ref 4.22–5.81)
RDW: 16.2 % — ABNORMAL HIGH (ref 11.5–15.5)
WBC: 18.8 10*3/uL — ABNORMAL HIGH (ref 4.0–10.5)

## 2015-08-02 LAB — GLUCOSE, CAPILLARY
GLUCOSE-CAPILLARY: 135 mg/dL — AB (ref 65–99)
GLUCOSE-CAPILLARY: 197 mg/dL — AB (ref 65–99)
GLUCOSE-CAPILLARY: 214 mg/dL — AB (ref 65–99)
Glucose-Capillary: 136 mg/dL — ABNORMAL HIGH (ref 65–99)

## 2015-08-02 LAB — BASIC METABOLIC PANEL
Anion gap: 12 (ref 5–15)
BUN: 28 mg/dL — AB (ref 6–20)
CALCIUM: 8.2 mg/dL — AB (ref 8.9–10.3)
CHLORIDE: 99 mmol/L — AB (ref 101–111)
CO2: 23 mmol/L (ref 22–32)
CREATININE: 1.76 mg/dL — AB (ref 0.61–1.24)
GFR calc Af Amer: 40 mL/min — ABNORMAL LOW (ref >60)
GFR calc non Af Amer: 34 mL/min — ABNORMAL LOW (ref >60)
GLUCOSE: 205 mg/dL — AB (ref 65–99)
Potassium: 4 mmol/L (ref 3.5–5.1)
Sodium: 134 mmol/L — ABNORMAL LOW (ref 135–145)

## 2015-08-02 LAB — CARBOXYHEMOGLOBIN
Carboxyhemoglobin: 1.3 % (ref 0.5–1.5)
Methemoglobin: 0.9 % (ref 0.0–1.5)
O2 SAT: 54.3 %
TOTAL HEMOGLOBIN: 9.4 g/dL — AB (ref 13.5–18.0)

## 2015-08-02 LAB — HEPARIN LEVEL (UNFRACTIONATED)
HEPARIN UNFRACTIONATED: 0.61 [IU]/mL (ref 0.30–0.70)
HEPARIN UNFRACTIONATED: 0.45 [IU]/mL (ref 0.30–0.70)

## 2015-08-02 LAB — MAGNESIUM: MAGNESIUM: 2.5 mg/dL — AB (ref 1.7–2.4)

## 2015-08-02 LAB — APTT: APTT: 68 s — AB (ref 24–37)

## 2015-08-02 MED ORDER — MILRINONE LACTATE IN DEXTROSE 20-5 MG/100ML-% IV SOLN
0.1250 ug/kg/min | INTRAVENOUS | Status: DC
  Administered 2015-08-02 – 2015-08-04 (×3): 0.125 ug/kg/min via INTRAVENOUS
  Filled 2015-08-02 (×4): qty 100

## 2015-08-02 NOTE — Progress Notes (Signed)
Patient ID: Martin [name], male   DOB: December 18, 1930, 82 y.o.   MRN: [id number]    Advanced Heart Failure Rounding Note   Subjective:    [name] is an 82 year old male with h/o CAD s/p anterior MI in 1985 followed by CABG and PCI of RCA, chronic systolic HF EF [id number], chronic AF and CKD followed by Dr. Wynonia Lawman. We have been asked to consult for possible low output HF.   Over past few weeks has been struggling with progressive volume overload, fatigue and DOE. Saw Dr. Wynonia Lawman in the office and was refractory to titration of oral diuretics so was admitted for IV diuresis. Did not respond well to IV lasix. Developed renal failure.  Moved to ICU due to hypotension. Urine with many bacteria. Developed urosepsis. Milrinone stopped norepi started.   Started on CVVHD 07/26/15.   HD cath replaced and now LIJ as of 07/29/15 2/2 leaking and mal-positioning.   Weight shows down 2 lbs on CVVH.  He remains anuric. WBC 10.9->16.5->20.1->21->22.1->18.8. CVP 11-13, co-ox lower at 54%. He is on norepinephrine 3 today (dose coming down).   Rate-controlled atrial fibrillation.   No dyspnea, comfortable.    UA- many bacteria. Insignificant growth UCx 07/24/15 - Insignificant growth Blood Cultures 07/24/15- No growth Blood Cultures 07/30/15 - NGTD   Objective:   Weight Range:  Vital Signs:   Temp:  [97.3 F (36.3 C)-97.6 F (36.4 C)] 97.3 F (36.3 C) (05/13 0500) Resp:  [12-28] 17 (05/13 0800) BP: (72-250)/(47-194) 93/59 mmHg (05/13 0800) SpO2:  [89 %-100 %] 100 % (05/13 0800) Weight:  [178 lb 9.2 oz (81 kg)] 178 lb 9.2 oz (81 kg) (05/13 0500) Last BM Date: 08/02/15  Weight change: Filed Weights   07/31/15 0500 08/01/15 0410 08/02/15 0500  Weight: 181 lb 7 oz (82.3 kg) 180 lb 12.4 oz (82 kg) 178 lb 9.2 oz (81 kg)    Intake/Output:   Intake/Output Summary (Last 24 hours) at 08/02/15 0835 Last data filed at 08/02/15 0800  Gross per 24 hour  Intake 2405.4 ml  Output   2652 ml  Net -246.6 ml      Physical Exam: CVP 11 General: Elderly male. Lying in bed NAD. Fatigued.  HEENT: normal Neck: supple. LIJ trialysis cath. JVP 10, Carotids 2+ bilat; no bruits. No thyromegaly or nodule noted.  Cor: PMI laterally displaced. IRR  Lungs: Scattered rhonchi. On 2 L via [place]. Abdomen: soft, NT, ND, no HSM. No bruits or masses. +BS , no HSM. No bruits or masses. +BS  Extremities: no cyanosis, clubbing, rash, trace ankle edema only Neuro: alert & orientedx3, cranial nerves grossly intact. moves all 4 extremities w/o difficulty. Affect pleasant  Telemetry: Reviewed personally, AF 80s  Labs: Basic Metabolic Panel:  Recent Labs Lab 07/29/15 0430  07/30/15 0215  07/31/15 0450 07/31/15 1600 08/01/15 0410 08/01/15 1600 08/02/15 0543  NA 132*  133*  < > 136  136  < > 134* 134* 133*  135 135 134*  135  K 4.3  4.1  < > 4.1  4.1  < > 4.0 3.9 3.9  3.9 4.1 4.0  4.0  CL 95*  97*  < > 98*  98*  < > 97* 97* 97*  98* 100* 99*  99*  CO2 22  21*  < > 23  23  < > 21* 22 22  22 22 23  23   GLUCOSE 228*  227*  < > 161*  161*  < > 288* 315* 233*  237* 222* 205*  200*  BUN 36*  34*  < > 31*  30*  < > 24* 29* 29*  29* 30* 28*  28*  CREATININE 2.23*  2.16*  < > 2.22*  2.23*  < > 1.90* 1.97* 1.89*  1.90* 1.84* 1.76*  1.65*  CALCIUM 8.3*  7.9*  < > 8.3*  8.3*  < > 8.0* 8.4* 8.4*  8.4* 7.7* 8.2*  8.2*  MG 2.5*  --  2.4  --  2.4  --  2.4  --  2.5*  PHOS 2.6  < > 2.0*  < > 2.3* 1.7* 2.2* 1.9* 3.5  < > = values in this interval not displayed.  Liver Function Tests:  Recent Labs Lab 07/31/15 0450 07/31/15 1600 08/01/15 0410 08/01/15 1600 08/02/15 0543  ALBUMIN 2.3* 2.2* 2.1* 2.1* 2.0*   No results for input(s): LIPASE, AMYLASE in the last 168 hours. No results for input(s): AMMONIA in the last 168 hours.  CBC:  Recent Labs Lab 07/29/15 0430 07/29/15 1637 07/30/15 0215 07/31/15 0450 08/01/15 0410 08/02/15 0543  WBC 16.5*  --  20.1* 21.3* 22.1* 18.8*  HGB 9.4*  9.3* 9.1* 8.8* 8.9* 8.8*  HCT 27.5* 26.5* 26.3* 25.6* 26.0* 26.1*  MCV 95.5  --  94.3 94.1 94.5 96.0  PLT 138*  --  130* 130* 148* 133*    Cardiac Enzymes: No results for input(s): CKTOTAL, CKMB, CKMBINDEX, TROPONINI in the last 168 hours.  BNP: BNP (last 3 results)  Recent Labs  07/21/15 1838  BNP 1660.5*    ProBNP (last 3 results) No results for input(s): PROBNP in the last 8760 hours.    Other results:  Imaging: No results found.   Medications:     Scheduled Medications: . anidulafungin  100 mg Intravenous Q24H  . antiseptic oral rinse  7 mL Mouth Rinse BID  . atorvastatin  40 mg Oral q1800  . feeding supplement (ENSURE ENLIVE)  237 mL Oral BID BM  . feeding supplement (PRO-STAT SUGAR FREE 64)  30 mL Oral TID BM  . ferrous sulfate  325 mg Oral Q breakfast  . imipenem-cilastatin  250 mg Intravenous Q6H  . insulin aspart  0-15 Units Subcutaneous TID WC  . insulin aspart  0-5 Units Subcutaneous QHS  . sodium chloride flush  10-40 mL Intracatheter Q12H  . sodium chloride flush  3 mL Intravenous Q12H  . vancomycin  1,000 mg Intravenous Q24H    Infusions: . sodium chloride Stopped (07/29/15 1100)  . amiodarone 30 mg/hr (08/02/15 0545)  . heparin 1,100 Units/hr (08/01/15 0745)  . norepinephrine (LEVOPHED) Adult infusion 3.5 mcg/min (08/02/15 0815)  . dialysis replacement fluid (prismasate) 200 mL/hr at 08/02/15 0624  . dialysis replacement fluid (prismasate) 400 mL/hr at 08/02/15 0132  . dialysate (PRISMASATE) 1,000 mL/hr at 08/02/15 0727    PRN Medications: sodium chloride, acetaminophen, alteplase, fentaNYL (SUBLIMAZE) injection, guaiFENesin, heparin, heparin, heparin, levalbuterol, ondansetron (ZOFRAN) IV, phenol, sodium chloride flush, sodium chloride flush, traMADol   Assessment:   1.Urosepsis 2. Acute on chronic systolic HF: Ischemic cardiomyopathy, EF 35% with wall motion abnormalities by echo.  3. Chronic AF now with RVR controlled by amiodarone.   4. Ischemic CM EF 20-25% 5. Acute on chronic renal failure, stage IV -> CVVHD started 5/6. Suspect hemodynamically-mediated ATN.  6. CAD s/p anterior MI followed by CABG and PCI RCA 7. Acute respiratory failure 8. Hypotension: mixed septic/cardiogenic shock.  9. Hypokalemia 10. Acute Gout Flare    Plan/Discussion:    Has  severe sepsis with worsening renal failure (suspect mixed septic/cardiogenic shock). Remains anuric on CVVHD. Norepinephrine at 3 today. Co-ox lower at 54%, CVP 11-13.  On vancomycin and imipenem + anidulafungin. WBCs lower today.  Cultures remain negative.  Continue current antimicrobials.      Remains on amio for rate control of chronic AF.  HR stable in 80s. Off apixaban for now and on heparin gtt.   CO-OX 54%. BP ok. Will continue current norepinephrine and add low dose milrinone 0.125 with lower co-ox.   Continue CVVH through weekend per outlined plan.  However, think prognosis is quite poor at this point and does not appear that he is going to have renal recovery from ATN.  If no significant rise in UOP by Monday, recommend moving to hospice.  He would not be a long-term HD candidate.   Loralie Champagne  8:35 AM  08/02/2015  Advanced Heart Failure Team Pager [phone] (M-F; 7a - 4p)  Please contact [facility] for night-coverage after hours (4p -7a ) and weekends on [email or web address]

## 2015-08-02 NOTE — Progress Notes (Signed)
[facility] Rounding Note  Subjective/interval history:  Remains without urine output Keeping volume even BP lower Norepi/milrinone added back today 5/13/heparin/amio Family in this AM   Weight trending 08/02/15                        81 kg 08/01/15                        82 kg 07/31/15  82.3 kg  07/30/15  84.5 kg  07/29/15  86.4 kg  07/28/15   88.8 kg (  07/27/15   91 kg   07/26/15 0500 CRRT started 91.5 kg     Objective: Vital signs in last 24 hours: Temp:  [97.3 F (36.3 C)-97.6 F (36.4 C)] 97.6 F (36.4 C) (05/13 0820) Resp:  [12-27] 17 (05/13 0800) BP: (72-250)/(47-194) 93/59 mmHg (05/13 0800) SpO2:  [89 %-100 %] 100 % (05/13 0800) Weight:  [81 kg (178 lb 9.2 oz)] 81 kg (178 lb 9.2 oz) (05/13 0500) Weight change: -1 kg (-2 lb 3.3 oz)  Intake/Output from previous day: 05/12 0701 - 05/13 0700 In: 2610 [P.O.:680; I.V.:942; IV Piggyback:988] Out: 2623    Intake/Output this shift: Total I/O In: 286.5 [P.O.:125; I.V.:61.5; IV Piggyback:100] Out: 225 [Other:225]   No urine  Physical examination CVP 13 Fatigued appearing, but still appropriate Resp: Anteriorly clear  Cardio: Irregular S1S2 no murmur Extremities: No edema LE's Left IJ HD cath (5/10) working well   Lab Results:  Recent Labs  08/01/15 0410 08/02/15 0543  WBC 22.1* 18.8*  HGB 8.9* 8.8*  HCT 26.0* 26.1*  PLT 148* 133*     Recent Labs  08/01/15 1600 08/02/15 0543  NA 135 134*  135  K 4.1 4.0  4.0  CL 100* 99*  99*  CO2 22 23  23   GLUCOSE 222* 205*  200*  BUN 30* 28*  28*  CREATININE 1.84* 1.76*  1.65*  CALCIUM 7.7* 8.2*  8.2*   PHOSPHORUS  Date Value Ref Range Status  08/02/2015 3.5 2.5 - 4.6 mg/dL Final  08/01/2015 1.9* 2.5 - 4.6 mg/dL Final  08/01/2015 2.2* 2.5 - 4.6 mg/dL Final  07/31/2015 1.7* 2.5 - 4.6 mg/dL Final  07/31/2015 2.3* 2.5 - 4.6 mg/dL Final     Medications Scheduled: . anidulafungin  100 mg Intravenous Q24H  . antiseptic oral rinse  7 mL Mouth Rinse  BID  . atorvastatin  40 mg Oral q1800  . feeding supplement (ENSURE ENLIVE)  237 mL Oral BID BM  . feeding supplement (PRO-STAT SUGAR FREE 64)  30 mL Oral TID BM  . ferrous sulfate  325 mg Oral Q breakfast  . imipenem-cilastatin  250 mg Intravenous Q6H  . insulin aspart  0-15 Units Subcutaneous TID WC  . insulin aspart  0-5 Units Subcutaneous QHS  . sodium chloride flush  10-40 mL Intracatheter Q12H  . sodium chloride flush  3 mL Intravenous Q12H  . vancomycin  1,000 mg Intravenous Q24H   Infusions: . sodium chloride Stopped (07/29/15 1100)  . amiodarone 30 mg/hr (08/02/15 0545)  . heparin 1,100 Units/hr (08/01/15 0745)  . milrinone 0.125 mcg/kg/min (08/02/15 0906)  . norepinephrine (LEVOPHED) Adult infusion 3.5 mcg/min (08/02/15 0815)  . dialysis replacement fluid (prismasate) 200 mL/hr at 08/02/15 0624  . dialysis replacement fluid (prismasate) 400 mL/hr at 08/02/15 0132  . dialysate (PRISMASATE) 1,000 mL/hr at 08/02/15 0727    Assessment/Plan:  1. AKI, hemodynamically mediated- AoCKD baseline creatinine 1.8.No  evidence for recovery at this time. CRRT (started 5/6). Currently keeping volume even. CVP 13  All 4K fluids. K is fine, phos OK. S/p  Phos replacement on 5/10. No evidence kidney function return. Plan is to continue through the weekend and if no return renal function (which I do not anticipate) then transition to Hospice.  2. Gross hematuria - voided blood 5/8 not much urine. Maybe bladder trauma and on heparin. Hb stable. No further urine of any kind (and no more hematuria) 3. Shock  - Pressor/inotrope (milrinone restarted) dependence likely multi-factorial and predom cardiogenic at this time.  4. Sepsis - ATB's changed to primaxin/vanco/anidulafungin - broadened d/t rising WBC, dropping co-x and more lethargy 5. AF with RVR - IV amio 6. Gout - R wrist - s/p pred 7. Prognosis - grim. Anticipate transition to Hospice care on Monday...   Martin Maes, MD [facility] [phone] Pager 08/02/2015, 9:30 AM

## 2015-08-02 NOTE — Progress Notes (Signed)
ANTICOAGULATION CONSULT NOTE - Follow Up Consult  Pharmacy Consult for Heparin  Indication: atrial fibrillation  No Known Allergies  Patient Measurements: Height: 5\' 7"  (170.2 cm) Weight: 178 lb 9.2 oz (81 kg) IBW/kg (Calculated) : 66.1  Vital Signs: Temp: 97.6 F (36.4 C) (05/13 0820) Temp Source: Oral (05/13 0820) BP: 92/57 mmHg (05/13 1000)  Labs:  Recent Labs  07/31/15 0450  08/01/15 0410 08/01/15 1515 08/01/15 1600 08/02/15 0543  HGB 8.8*  --  8.9*  --   --  8.8*  HCT 25.6*  --  26.0*  --   --  26.1*  PLT 130*  --  148*  --   --  133*  APTT 85*  --  60* 74*  --  68*  HEPARINUNFRC 0.88*  --   --   --   --  0.61  CREATININE 1.90*  < > 1.89*  1.90*  --  1.84* 1.76*  1.65*  < > = values in this interval not displayed.  Estimated Creatinine Clearance: 33 mL/min (by C-G formula based on Cr of 1.76).   Assessment: 82 yo M admitted with low output HF. Pt has chronic afib and was taking apixaban PTA. Apixaban was initially continued during admission, but it was stopped and changed to heparin on 5/6 when the decision was made to initiate CVVHD due to acute renal failure.  AM HL (0.61) and aPTT (68) both at goal and now correlating. Will now only check heparin levels. Continue current rate @ 1100 units/hr. CBC stab;e, no bleeding noted  Goal of Therapy:  Heparin level 0.3-0.7 units/ml aPTT 66-102 seconds Monitor platelets by anticoagulation protocol: Yes   Plan:  Continue heparin at 1100 units/hr  Daily CBC/HL Monitor for bleeding  Kasen Sako C. Lennox Grumbles, PharmD Pharmacy Resident  Pager: [phone] 08/02/2015 10:47 AM

## 2015-08-02 NOTE — Progress Notes (Signed)
Pt did voided about 85cc w/ old blood,pharmacy made aware w/ no changes in the heparin gtt for now.

## 2015-08-03 LAB — GLUCOSE, CAPILLARY
GLUCOSE-CAPILLARY: 153 mg/dL — AB (ref 65–99)
GLUCOSE-CAPILLARY: 252 mg/dL — AB (ref 65–99)
Glucose-Capillary: 148 mg/dL — ABNORMAL HIGH (ref 65–99)
Glucose-Capillary: 147 mg/dL — ABNORMAL HIGH (ref 65–99)

## 2015-08-03 LAB — CBC
HCT: 26 % — ABNORMAL LOW (ref 39.0–52.0)
HEMOGLOBIN: 9 g/dL — AB (ref 13.0–17.0)
MCH: 33.8 pg (ref 26.0–34.0)
MCHC: 34.6 g/dL (ref 30.0–36.0)
MCV: 97.7 fL (ref 78.0–100.0)
Platelets: 129 10*3/uL — ABNORMAL LOW (ref 150–400)
RBC: 2.66 MIL/uL — AB (ref 4.22–5.81)
RDW: 16.3 % — ABNORMAL HIGH (ref 11.5–15.5)
WBC: 18.8 10*3/uL — ABNORMAL HIGH (ref 4.0–10.5)

## 2015-08-03 LAB — VANCOMYCIN, TROUGH: Vancomycin Tr: 27 ug/mL (ref 10.0–20.0)

## 2015-08-03 LAB — RENAL FUNCTION PANEL
ALBUMIN: 1.8 g/dL — AB (ref 3.5–5.0)
Albumin: 1.9 g/dL — ABNORMAL LOW (ref 3.5–5.0)
Anion gap: 10 (ref 5–15)
Anion gap: 9 (ref 5–15)
BUN: 26 mg/dL — ABNORMAL HIGH (ref 6–20)
BUN: 22 mg/dL — AB (ref 6–20)
CALCIUM: 8.3 mg/dL — AB (ref 8.9–10.3)
CALCIUM: 7.9 mg/dL — AB (ref 8.9–10.3)
CHLORIDE: 101 mmol/L (ref 101–111)
CO2: 25 mmol/L (ref 22–32)
CO2: 24 mmol/L (ref 22–32)
CREATININE: 1.62 mg/dL — AB (ref 0.61–1.24)
CREATININE: 1.65 mg/dL — AB (ref 0.61–1.24)
Chloride: 97 mmol/L — ABNORMAL LOW (ref 101–111)
GFR calc non Af Amer: 38 mL/min — ABNORMAL LOW (ref >60)
GFR, EST AFRICAN AMERICAN: 44 mL/min — AB (ref >60)
GFR, EST AFRICAN AMERICAN: 43 mL/min — AB (ref >60)
GFR, EST NON AFRICAN AMERICAN: 37 mL/min — AB (ref >60)
GLUCOSE: 139 mg/dL — AB (ref 65–99)
Glucose, Bld: 247 mg/dL — ABNORMAL HIGH (ref 65–99)
PHOSPHORUS: 2.1 mg/dL — AB (ref 2.5–4.6)
Phosphorus: 2.1 mg/dL — ABNORMAL LOW (ref 2.5–4.6)
Potassium: 4.3 mmol/L (ref 3.5–5.1)
Potassium: 4.1 mmol/L (ref 3.5–5.1)
SODIUM: 136 mmol/L (ref 135–145)
Sodium: 130 mmol/L — ABNORMAL LOW (ref 135–145)

## 2015-08-03 LAB — CARBOXYHEMOGLOBIN
Carboxyhemoglobin: 1.6 % — ABNORMAL HIGH (ref 0.5–1.5)
Methemoglobin: 0.9 % (ref 0.0–1.5)
O2 SAT: 63.4 %
TOTAL HEMOGLOBIN: 6.1 g/dL — AB (ref 13.5–18.0)

## 2015-08-03 LAB — OCCULT BLOOD X 1 CARD TO LAB, STOOL: Fecal Occult Bld: NEGATIVE

## 2015-08-03 LAB — HEPARIN LEVEL (UNFRACTIONATED)
HEPARIN UNFRACTIONATED: 0.16 [IU]/mL — AB (ref 0.30–0.70)
HEPARIN UNFRACTIONATED: 0.44 [IU]/mL (ref 0.30–0.70)

## 2015-08-03 LAB — MAGNESIUM: MAGNESIUM: 2.3 mg/dL (ref 1.7–2.4)

## 2015-08-03 MED ORDER — SODIUM CHLORIDE 0.9 % IV SOLN
500.0000 mg | Freq: Three times a day (TID) | INTRAVENOUS | Status: DC
  Administered 2015-08-03 – 2015-08-05 (×6): 500 mg via INTRAVENOUS
  Filled 2015-08-03 (×8): qty 500

## 2015-08-03 NOTE — Progress Notes (Signed)
BP was noted to be different from the automatic bp vs manual pressure w/ findings clarified by the charge nurse-pt still on low dose levophed notified Dr Rosanna Randy.

## 2015-08-03 NOTE — Progress Notes (Signed)
Patient ID: Martin [name], male   DOB: Dec 12, 1930, 82 y.o.   MRN: [id number]    Advanced Heart Failure Rounding Note   Subjective:    Martin [name] is an 82 year old male with h/o CAD s/p anterior MI in 1985 followed by CABG and PCI of RCA, chronic systolic HF EF 123456, chronic AF and CKD followed by Dr. Wynonia Lawman. We have been asked to consult for possible low output HF.   Over past few weeks has been struggling with progressive volume overload, fatigue and DOE. Saw Dr. Wynonia Lawman in the office and was refractory to titration of oral diuretics so was admitted for IV diuresis. Did not respond well to IV lasix. Developed renal failure.  Moved to ICU due to hypotension. Urine with many bacteria. Developed urosepsis. Milrinone stopped norepi started.   Started on CVVHD 07/26/15.   HD cath replaced and now LIJ as of 07/29/15 2/2 leaking and mal-positioning.   Weight stable on CVVH.  He remains anuric. WBC 10.9->16.5->20.1->21->22.1->18.8->18.8. CVP 7, co-ox better at 63% after restarting milrinone 0.125 on 5/13. He is on norepinephrine 4 today.   Rate-controlled atrial fibrillation.   No dyspnea, comfortable.    UA- many bacteria. Insignificant growth UCx 07/24/15 - Insignificant growth Blood Cultures 07/24/15- No growth Blood Cultures 07/30/15 - NGTD   Objective:   Weight Range:  Vital Signs:   Temp:  [97.4 F (36.3 C)-98.6 F (37 C)] 98.6 F (37 C) (05/14 0700) Pulse Rate:  [93] 93 (05/13 1600) Resp:  [12-32] 14 (05/14 0800) BP: (78-147)/(42-112) 102/53 mmHg (05/14 0800) SpO2:  [95 %-100 %] 96 % (05/14 0800) Weight:  [177 lb 4 oz (80.4 kg)] 177 lb 4 oz (80.4 kg) (05/14 0500) Last BM Date: 08/02/15  Weight change: Filed Weights   08/01/15 0410 08/02/15 0500 08/03/15 0500  Weight: 180 lb 12.4 oz (82 kg) 178 lb 9.2 oz (81 kg) 177 lb 4 oz (80.4 kg)    Intake/Output:   Intake/Output Summary (Last 24 hours) at 08/03/15 0845 Last data filed at 08/03/15 0800  Gross per 24 hour  Intake 2517.19 ml    Output   2486 ml  Net  31.19 ml     Physical Exam: CVP 7 General: Elderly male. Lying in bed NAD. Fatigued.  HEENT: normal Neck: supple. LIJ trialysis cath. JVP not elevated, Carotids 2+ bilat; no bruits. No thyromegaly or nodule noted.  Cor: PMI laterally displaced. IRR  Lungs: Scattered rhonchi. On 2 L via [place]. Abdomen: soft, NT, ND, no HSM. No bruits or masses. +BS , no HSM. No bruits or masses. +BS  Extremities: no cyanosis, clubbing, rash.  No edema.  Neuro: alert & orientedx3, cranial nerves grossly intact. moves all 4 extremities w/o difficulty. Affect pleasant  Telemetry: Reviewed personally, AF 80s  Labs: Basic Metabolic Panel:  Recent Labs Lab 07/30/15 0215  07/31/15 0450  08/01/15 0410 08/01/15 1600 08/02/15 0543 08/02/15 1544 08/03/15 0640  NA 136  136  < > 134*  < > 133*  135 135 134*  135 131* 136  K 4.1  4.1  < > 4.0  < > 3.9  3.9 4.1 4.0  4.0 3.9 4.3  CL 98*  98*  < > 97*  < > 97*  98* 100* 99*  99* 98* 101  CO2 23  23  < > 21*  < > 22  22 22 23  23 22 25   GLUCOSE 161*  161*  < > 288*  < > 233*  237* 222*  205*  200* 270* 139*  BUN 31*  30*  < > 24*  < > 29*  29* 30* 28*  28* 28* 26*  CREATININE 2.22*  2.23*  < > 1.90*  < > 1.89*  1.90* 1.84* 1.76*  1.65* 1.68* 1.62*  CALCIUM 8.3*  8.3*  < > 8.0*  < > 8.4*  8.4* 7.7* 8.2*  8.2* 7.8* 8.3*  MG 2.4  --  2.4  --  2.4  --  2.5*  --  2.3  PHOS 2.0*  < > 2.3*  < > 2.2* 1.9* 3.5 2.2* 2.1*  < > = values in this interval not displayed.  Liver Function Tests:  Recent Labs Lab 08/01/15 0410 08/01/15 1600 08/02/15 0543 08/02/15 1544 08/03/15 0640  ALBUMIN 2.1* 2.1* 2.0* 1.9* 1.9*   No results for input(s): LIPASE, AMYLASE in the last 168 hours. No results for input(s): AMMONIA in the last 168 hours.  CBC:  Recent Labs Lab 07/30/15 0215 07/31/15 0450 08/01/15 0410 08/02/15 0543 08/03/15 0640  WBC 20.1* 21.3* 22.1* 18.8* 18.8*  HGB 9.1* 8.8* 8.9* 8.8* 9.0*  HCT 26.3* 25.6*  26.0* 26.1* 26.0*  MCV 94.3 94.1 94.5 96.0 97.7  PLT 130* 130* 148* 133* 129*    Cardiac Enzymes: No results for input(s): CKTOTAL, CKMB, CKMBINDEX, TROPONINI in the last 168 hours.  BNP: BNP (last 3 results)  Recent Labs  07/21/15 1838  BNP 1660.5*    ProBNP (last 3 results) No results for input(s): PROBNP in the last 8760 hours.    Other results:  Imaging: No results found.   Medications:     Scheduled Medications: . anidulafungin  100 mg Intravenous Q24H  . antiseptic oral rinse  7 mL Mouth Rinse BID  . atorvastatin  40 mg Oral q1800  . feeding supplement (ENSURE ENLIVE)  237 mL Oral BID BM  . feeding supplement (PRO-STAT SUGAR FREE 64)  30 mL Oral TID BM  . ferrous sulfate  325 mg Oral Q breakfast  . imipenem-cilastatin  250 mg Intravenous Q6H  . insulin aspart  0-15 Units Subcutaneous TID WC  . insulin aspart  0-5 Units Subcutaneous QHS  . sodium chloride flush  10-40 mL Intracatheter Q12H  . sodium chloride flush  3 mL Intravenous Q12H  . vancomycin  1,000 mg Intravenous Q24H    Infusions: . sodium chloride Stopped (07/29/15 1100)  . amiodarone 30 mg/hr (08/02/15 1720)  . heparin 1,100 Units/hr (08/02/15 1155)  . milrinone 0.125 mcg/kg/min (08/03/15 0802)  . norepinephrine (LEVOPHED) Adult infusion 4.4 mcg/min (08/03/15 0500)  . dialysis replacement fluid (prismasate) 200 mL/hr at 08/03/15 0800  . dialysis replacement fluid (prismasate) 400 mL/hr at 08/03/15 0253  . dialysate (PRISMASATE) 1,000 mL/hr at 08/02/15 2240    PRN Medications: sodium chloride, acetaminophen, alteplase, fentaNYL (SUBLIMAZE) injection, guaiFENesin, heparin, heparin, heparin, levalbuterol, ondansetron (ZOFRAN) IV, phenol, sodium chloride flush, sodium chloride flush, traMADol   Assessment:   1.Urosepsis 2. Acute on chronic systolic HF: Ischemic cardiomyopathy, EF 35% with wall motion abnormalities by echo.  3. Chronic AF now with RVR controlled by amiodarone.  4. Ischemic  CM EF 20-25% 5. Acute on chronic renal failure, stage IV -> CVVHD started 5/6. Suspect hemodynamically-mediated ATN.  6. CAD s/p anterior MI followed by CABG and PCI RCA 7. Acute respiratory failure 8. Hypotension: mixed septic/cardiogenic shock.  9. Hypokalemia 10. Acute Gout Flare    Plan/Discussion:    Suspect mixed septic/cardiogenic shock with AKI/ATN. Remains anuric on CVVH.  Norepinephrine at  4 today, BP stable. Co-ox improved to 63% after restarting milrinone 0.125 yesterday. CVP in 7 range, weight stable.  CVVH running even.   On vancomycin and imipenem + anidulafungin. WBCs, afebrile.  Cultures remain negative.  Continue current antimicrobials.      Remains on amio for rate control of chronic AF.  HR stable in 80s. Off apixaban for now and on heparin gtt.   Continue CVVH through weekend per outlined plan.  However, think prognosis is quite poor at this point and does not appear that he is going to have renal recovery from ATN.  If no significant rise in UOP by Monday, recommend moving to hospice.  He would not be a long-term HD candidate.  Based on current trajectory, I think chances of renal recovery are poor.   Loralie Champagne  8:45 AM  08/03/2015  Advanced Heart Failure Team Pager [phone] (M-F; 7a - 4p)  Please contact [facility] for night-coverage after hours (4p -7a ) and weekends on [email or web address]

## 2015-08-03 NOTE — Progress Notes (Signed)
ANTICOAGULATION CONSULT NOTE - Follow Up Consult  Pharmacy Consult for Heparin  Indication: atrial fibrillation  No Known Allergies  Patient Measurements: Height: 5\' 7"  (170.2 cm) Weight: 177 lb 4 oz (80.4 kg) IBW/kg (Calculated) : 66.1  Vital Signs: Temp: 98.6 F (37 C) (05/14 0700) Temp Source: Oral (05/14 0700) BP: 102/53 mmHg (05/14 0800)  Labs:  Recent Labs  08/01/15 0410 08/01/15 1515  08/02/15 0543 08/02/15 1544 08/02/15 1630 08/03/15 0602 08/03/15 0640  HGB 8.9*  --   --  8.8*  --   --   --  9.0*  HCT 26.0*  --   --  26.1*  --   --   --  26.0*  PLT 148*  --   --  133*  --   --   --  129*  APTT 60* 74*  --  68*  --   --   --   --   HEPARINUNFRC  --   --   < > 0.61  --  0.45 0.16* 0.44  CREATININE 1.89*  1.90*  --   < > 1.76*  1.65* 1.68*  --   --  1.62*  < > = values in this interval not displayed.  Estimated Creatinine Clearance: 35.7 mL/min (by C-G formula based on Cr of 1.62).   Assessment: 82 yo M admitted with low output HF. Pt has chronic afib and was taking apixaban PTA. Apixaban was initially continued during admission, but it was stopped and changed to heparin on 5/6 when the decision was made to initiate CVVHD due to acute renal failure.  AM HL (0.44) Continue current rate @ 1100 units/hr. CBC stable, no bleeding.   Goal of Therapy:  Heparin level 0.3-0.7 units/ml aPTT 66-102 seconds Monitor platelets by anticoagulation protocol: Yes   Plan:  Continue heparin at 1100 units/hr  Daily CBC/HL Monitor for bleeding  Irine Heminger C. Lennox Grumbles, PharmD Pharmacy Resident  Pager: [phone] 08/03/2015 9:03 AM

## 2015-08-03 NOTE — Progress Notes (Signed)
Pharmacy Antibiotic Note  Martin [name] is a 82 y.o. male on vancomycin, imipenem and anidulafungin for possible sepsis 2/2 UTI vs HCAP.   Now day #11 of abx for sepsis with unknown source. Afebrile, WBC up to 18.8 but did receive prednisone 40mg  x6 doses for gout recently. PT currently on CVVHD   Vancomycin trough elevated this evening at 27  Plan: Hold Vancomycin -- recheck level in a couple of days Continue to follow  Height: 5\' 7"  (170.2 cm) Weight: 177 lb 4 oz (80.4 kg) IBW/kg (Calculated) : 66.1  Temp (24hrs), Avg:97.8 F (36.6 C), Min:97 F (36.1 C), Max:98.6 F (37 C)   Recent Labs Lab 07/29/15 1637 07/30/15 0215  07/31/15 0450  08/01/15 0410 08/01/15 1600 08/02/15 0543 08/02/15 1544 08/03/15 0640 08/03/15 1600 08/03/15 2130  WBC  --  20.1*  --  21.3*  --  22.1*  --  18.8*  --  18.8*  --   --   CREATININE  --  2.22*  2.23*  < > 1.90*  < > 1.89*  1.90* 1.84* 1.76*  1.65* 1.68* 1.62* 1.65*  --   VANCOTROUGH 24*  --   --   --   --   --   --   --   --   --   --  27*  < > = values in this interval not displayed.  Estimated Creatinine Clearance: 35.1 mL/min (by C-G formula based on Cr of 1.65).    No Known Allergies  Antimicrobials this admission: 5/4 Vancomycin >>  -Loaded 1250 mg at 1810 on 5/4      5/9 VT = 24 (drawn 1 hr early and CRRT was off, kept level the same but moved back dose time)  5/4 Ceftazidime >> 5/5  5/5 Zosyn >>5/10  5/10 Primaxin >  5/11 Anidulafungin >  Microbiology results: 5/10 BCx x 2 >ngtd  5/4 UCx >> 7K colonies, insignificant  5/4 BCx >> Neg  5/3 MRSA PCR neg  Thank you Anette Guarneri, PharmD [phone] 08/03/2015 10:30 PM

## 2015-08-03 NOTE — Progress Notes (Signed)
[facility] Rounding Note  Subjective/interval history:  Now with some dark urine output, concern for frank blood in urine Patient denies SOB, chest pain Keeping volume even BP lower Norepi; milrinone added back  5/13 Grandson present this AM   Weight trending 08/02/15                        81 kg 08/01/15                        82 kg 07/31/15  82.3 kg  07/30/15  84.5 kg  07/29/15  86.4 kg  07/28/15   88.8 kg (  07/27/15   91 kg   07/26/15 0500 CRRT started 91.5 kg     Objective: Vital signs in last 24 hours: Temp:  [97.4 F (36.3 C)-98.6 F (37 C)] 98.6 F (37 C) (05/14 0700) Pulse Rate:  [93] 93 (05/13 1600) Resp:  [12-32] 22 (05/14 1000) BP: (78-147)/(42-112) 86/53 mmHg (05/14 1000) SpO2:  [95 %-100 %] 99 % (05/14 1000) Weight:  [80.4 kg (177 lb 4 oz)] 80.4 kg (177 lb 4 oz) (05/14 0500) Weight change: -0.6 kg (-1 lb 5.2 oz)  Intake/Output from previous day: 05/13 0701 - 05/14 0700 In: 2563 [P.O.:1020; I.V.:813; IV Piggyback:730] Out: 2524 [Urine:50]   Intake/Output this shift: Total I/O In: 324.4 [P.O.:120; I.V.:104.4; IV Piggyback:100] Out: 437 [Urine:125; Other:312]   No urine  Physical examination Overall NAD but fatigued appearing Resp: Anteriorly clear  Cardio: Irregular S1S2 no murmur Extremities: No edema LE's Left IJ HD cath (5/10) working well   Lab Results:  Recent Labs  08/02/15 0543 08/03/15 0640  WBC 18.8* 18.8*  HGB 8.8* 9.0*  HCT 26.1* 26.0*  PLT 133* 129*     Recent Labs  08/02/15 1544 08/03/15 0640  NA 131* 136  K 3.9 4.3  CL 98* 101  CO2 22 25  GLUCOSE 270* 139*  BUN 28* 26*  CREATININE 1.68* 1.62*  CALCIUM 7.8* 8.3*   PHOSPHORUS  Date Value Ref Range Status  08/03/2015 2.1* 2.5 - 4.6 mg/dL Final  08/02/2015 2.2* 2.5 - 4.6 mg/dL Final  08/02/2015 3.5 2.5 - 4.6 mg/dL Final  08/01/2015 1.9* 2.5 - 4.6 mg/dL Final  08/01/2015 2.2* 2.5 - 4.6 mg/dL Final     Medications Scheduled: . anidulafungin  100 mg Intravenous  Q24H  . antiseptic oral rinse  7 mL Mouth Rinse BID  . atorvastatin  40 mg Oral q1800  . feeding supplement (ENSURE ENLIVE)  237 mL Oral BID BM  . feeding supplement (PRO-STAT SUGAR FREE 64)  30 mL Oral TID BM  . ferrous sulfate  325 mg Oral Q breakfast  . imipenem-cilastatin  250 mg Intravenous Q6H  . insulin aspart  0-15 Units Subcutaneous TID WC  . insulin aspart  0-5 Units Subcutaneous QHS  . sodium chloride flush  10-40 mL Intracatheter Q12H  . sodium chloride flush  3 mL Intravenous Q12H  . vancomycin  1,000 mg Intravenous Q24H   Infusions: . sodium chloride Stopped (07/29/15 1100)  . amiodarone 30 mg/hr (08/02/15 1720)  . heparin 1,100 Units/hr (08/03/15 1003)  . milrinone 0.125 mcg/kg/min (08/03/15 0802)  . norepinephrine (LEVOPHED) Adult infusion 4.4 mcg/min (08/03/15 0500)  . dialysis replacement fluid (prismasate) 200 mL/hr at 08/03/15 0800  . dialysis replacement fluid (prismasate) 400 mL/hr at 08/03/15 0253  . dialysate (PRISMASATE) 1,000 mL/hr at 08/03/15 0849    Background 82 year old male with h/o CAD  s/p anterior MI in 1985 followed by CABG and PCI of RCA, chronic systolic HF EF 123456, chronic AF and CKD3 at baseline; now with AKI on CKD in setting of low output heart failure and shock (sepsis + cardiogenic). CRRT dependent since 5.6.17.  Assessment/Plan:  1. AKI, hemodynamically mediated- AoCKD baseline creatinine 1.8. No evidence for recovery at this time. CRRT (started 5/6). Currently keeping volume even. All 4K fluids. K is fine, phos OK. S/p  Phos replacement on 5/10. No real evidence kidney function return though again small amount ? Grossly bloody urine. Plan is to continue through the weekend and then stop.  If no return renal function (which I do not anticipate) then transition to Hospice.  2. Gross hematuria - voided blood 5/8 and again today approximately 50 cc. Maybe bladder trauma and on heparin. Hb stable at 9 this AM. Will continue to follow 3. Shock  -  Pressor/inotrope (milrinone restarted) dependence likely multi-factorial and predom cardiogenic at this time.  4. Sepsis - ATB's changed to primaxin/vanco/anidulafungin - broadened d/t rising WBC, dropping co-x and more lethargy 5. AF with RVR - IV amio 6. Gout - R wrist - s/p pred 7. Prognosis - grim. Anticipate transition to Hospice care on Monday...   Alyssa A. Lincoln Brigham MD, [facility] Resident PGY-2 Pager [phone]  I have seen and examined this patient and agree with plan and assessment in the above note. Nothing to add. Keilen Kahl B,MD 08/03/2015 11:07 AM

## 2015-08-03 NOTE — Progress Notes (Signed)
Pharmacy Antibiotic Note  Martin [name] is a 82 y.o. male on vancomycin, imipenem and anidulafungin for possible sepsis 2/2 UTI vs HCAP.   Now day #11 of abx for sepsis with unknown source. Afebrile, WBC up to 18.8 but did receive prednisone 40mg  x6 doses for gout recently. PT currently on CVVHD    Plan: Vancomycin 1g IV q 24 (checking trough tonight)  Primaxin 500mg  IV q 8 hrs  Anidulafungin 100mg  IV q 24 Monitor clinical picture, renal function F/U C&S, abx deescalation / LOT    Height: 5\' 7"  (170.2 cm) Weight: 177 lb 4 oz (80.4 kg) IBW/kg (Calculated) : 66.1  Temp (24hrs), Avg:98.1 F (36.7 C), Min:97.4 F (36.3 C), Max:98.6 F (37 C)   Recent Labs Lab 07/29/15 1637 07/30/15 0215  07/31/15 0450  08/01/15 0410 08/01/15 1600 08/02/15 0543 08/02/15 1544 08/03/15 0640  WBC  --  20.1*  --  21.3*  --  22.1*  --  18.8*  --  18.8*  CREATININE  --  2.22*  2.23*  < > 1.90*  < > 1.89*  1.90* 1.84* 1.76*  1.65* 1.68* 1.62*  VANCOTROUGH 24*  --   --   --   --   --   --   --   --   --   < > = values in this interval not displayed.  Estimated Creatinine Clearance: 35.7 mL/min (by C-G formula based on Cr of 1.62).    No Known Allergies  Antimicrobials this admission: 5/4 Vancomycin >>  -Loaded 1250 mg at 1810 on 5/4      5/9 VT = 24 (drawn 1 hr early and CRRT was off, kept level the same but moved back dose time)  5/4 Ceftazidime >> 5/5  5/5 Zosyn >>5/10  5/10 Primaxin >  5/11 Anidulafungin >  Microbiology results: 5/10 BCx x 2 >ngtd  5/4 UCx >> 7K colonies, insignificant  5/4 BCx >> Neg  5/3 MRSA PCR neg  Tayden Nichelson C. Lennox Grumbles, PharmD Pharmacy Resident  Pager: [phone] 08/03/2015 9:07 AM

## 2015-08-04 DIAGNOSIS — L8990 Pressure ulcer of unspecified site, unspecified stage: Secondary | ICD-10-CM | POA: Insufficient documentation

## 2015-08-04 LAB — RENAL FUNCTION PANEL
ALBUMIN: 1.9 g/dL — AB (ref 3.5–5.0)
Albumin: 1.9 g/dL — ABNORMAL LOW (ref 3.5–5.0)
Anion gap: 10 (ref 5–15)
Anion gap: 10 (ref 5–15)
BUN: 20 mg/dL (ref 6–20)
BUN: 21 mg/dL — ABNORMAL HIGH (ref 6–20)
CALCIUM: 8.3 mg/dL — AB (ref 8.9–10.3)
CHLORIDE: 101 mmol/L (ref 101–111)
CHLORIDE: 101 mmol/L (ref 101–111)
CO2: 23 mmol/L (ref 22–32)
CO2: 24 mmol/L (ref 22–32)
CREATININE: 1.68 mg/dL — AB (ref 0.61–1.24)
CREATININE: 1.72 mg/dL — AB (ref 0.61–1.24)
Calcium: 8.3 mg/dL — ABNORMAL LOW (ref 8.9–10.3)
GFR calc Af Amer: 42 mL/min — ABNORMAL LOW (ref >60)
GFR, EST AFRICAN AMERICAN: 41 mL/min — AB (ref >60)
GFR, EST NON AFRICAN AMERICAN: 36 mL/min — AB (ref >60)
GFR, EST NON AFRICAN AMERICAN: 35 mL/min — AB (ref >60)
Glucose, Bld: 104 mg/dL — ABNORMAL HIGH (ref 65–99)
Glucose, Bld: 101 mg/dL — ABNORMAL HIGH (ref 65–99)
Phosphorus: 2.2 mg/dL — ABNORMAL LOW (ref 2.5–4.6)
Phosphorus: 2.9 mg/dL (ref 2.5–4.6)
Potassium: 4.3 mmol/L (ref 3.5–5.1)
Potassium: 4.3 mmol/L (ref 3.5–5.1)
SODIUM: 134 mmol/L — AB (ref 135–145)
Sodium: 135 mmol/L (ref 135–145)

## 2015-08-04 LAB — CARBOXYHEMOGLOBIN
Carboxyhemoglobin: 1.8 % — ABNORMAL HIGH (ref 0.5–1.5)
Methemoglobin: 0.7 % (ref 0.0–1.5)
O2 Saturation: 59.1 %
TOTAL HEMOGLOBIN: 13.7 g/dL (ref 13.5–18.0)

## 2015-08-04 LAB — CBC
HCT: 25.7 % — ABNORMAL LOW (ref 39.0–52.0)
Hemoglobin: 9 g/dL — ABNORMAL LOW (ref 13.0–17.0)
MCH: 33.5 pg (ref 26.0–34.0)
MCHC: 35 g/dL (ref 30.0–36.0)
MCV: 95.5 fL (ref 78.0–100.0)
PLATELETS: 121 10*3/uL — AB (ref 150–400)
RBC: 2.69 MIL/uL — AB (ref 4.22–5.81)
RDW: 16.4 % — ABNORMAL HIGH (ref 11.5–15.5)
WBC: 21.2 10*3/uL — AB (ref 4.0–10.5)

## 2015-08-04 LAB — GLUCOSE, CAPILLARY
GLUCOSE-CAPILLARY: 123 mg/dL — AB (ref 65–99)
GLUCOSE-CAPILLARY: 101 mg/dL — AB (ref 65–99)
Glucose-Capillary: 149 mg/dL — ABNORMAL HIGH (ref 65–99)

## 2015-08-04 LAB — MAGNESIUM: MAGNESIUM: 2.3 mg/dL (ref 1.7–2.4)

## 2015-08-04 LAB — HEPARIN LEVEL (UNFRACTIONATED): HEPARIN UNFRACTIONATED: 0.3 [IU]/mL (ref 0.30–0.70)

## 2015-08-04 MED ORDER — PREDNISONE 20 MG PO TABS
40.0000 mg | ORAL_TABLET | Freq: Every day | ORAL | Status: DC
  Administered 2015-08-05: 40 mg via ORAL
  Filled 2015-08-04: qty 2

## 2015-08-04 MED ORDER — VANCOMYCIN HCL IN DEXTROSE 750-5 MG/150ML-% IV SOLN
750.0000 mg | INTRAVENOUS | Status: DC
  Administered 2015-08-04: 750 mg via INTRAVENOUS
  Filled 2015-08-04 (×2): qty 150

## 2015-08-04 NOTE — Progress Notes (Signed)
Pharmacy Antibiotic Note  Martin [name] is a 81 y.o. male on vancomycin, imipenem and anidulafungin for possible sepsis 2/2 UTI vs HCAP.   Now day #11 of abx for sepsis with unknown source. Afebrile, WBC up to 18.8 but did receive prednisone 40mg  x6 doses for gout recently. PT currently on CVVHD   Vancomycin trough elevated 5/14 evening  at 27  Plan: Hold Vancomycin last pm Restart 5/15 at lower dose as CVVHD had time to clear more Vancomycin 750mg  q24   Height: 5\' 7"  (170.2 cm) Weight: 175 lb 11.3 oz (79.7 kg) IBW/kg (Calculated) : 66.1  Temp (24hrs), Avg:97.7 F (36.5 C), Min:97 F (36.1 C), Max:98.1 F (36.7 C)   Recent Labs Lab 07/29/15 1637  07/31/15 0450  08/01/15 0410  08/02/15 0543 08/02/15 1544 08/03/15 0640 08/03/15 1600 08/03/15 2130 08/04/15 0505  WBC  --   < > 21.3*  --  22.1*  --  18.8*  --  18.8*  --   --  21.2*  CREATININE  --   < > 1.90*  < > 1.89*  1.90*  < > 1.76*  1.65* 1.68* 1.62* 1.65*  --  1.68*  VANCOTROUGH 24*  --   --   --   --   --   --   --   --   --  27*  --   < > = values in this interval not displayed.  Estimated Creatinine Clearance: 34.3 mL/min (by C-G formula based on Cr of 1.68).    No Known Allergies  Antimicrobials this admission: 5/4 Vancomycin >>  -Loaded 1250 mg at 1810 on 5/4      5/9 VT = 24 (drawn 1 hr early and CRRT was off, kept level the same but moved back dose time)  5/14 VT 27 dose held x 24hr Restart vancomycin 750mg  qd  5/4 Ceftazidime >> 5/5  5/5 Zosyn >>5/10  5/10 Primaxin >  5/11 Anidulafungin >  Microbiology results: 5/10 BCx x 2 >ngtd  5/4 UCx >> 7K colonies, insignificant  5/4 BCx >> Neg  5/3 MRSA PCR neg  Bonnita Nasuti Pharm.D. CPP, BCPS Clinical Pharmacist [phone] 08/04/2015 1:01 PM

## 2015-08-04 NOTE — Progress Notes (Signed)
Events of weekend reviewed. Some grossly bloody UOP but not a lot. A line placed. Co ox lower.   Appreciate care of all involved.   Kerry Hough MD [facility] 1:46 PM

## 2015-08-04 NOTE — Plan of Care (Signed)
Cardiology Fellow Plan of Care  Major discrepancies in blood pressure observed by the nurse, confirmed by myself, with manual (80/60's) & electronic cuffs (170/150's) with multiple machines used.  Only able to measure in the left arm due to a PICC in the right arm.  Asked the pt if he would be agreeable to a-line placement.  He supported this.    Frann Rider

## 2015-08-04 NOTE — Progress Notes (Signed)
Patient ID: Martin [name], male   DOB: [birth year], 82 y.o.   MRN: [id number]    Advanced Heart Failure Rounding Note   Subjective:    [name] is an 82 year old male with h/o CAD s/p anterior MI in 1985 followed by CABG and PCI of RCA, chronic systolic HF EF [id number], chronic AF and CKD followed by Dr. Wynonia Lawman. We have been asked to consult for possible low output HF.   Over past few weeks has been struggling with progressive volume overload, fatigue and DOE. Saw Dr. Wynonia Lawman in the office and was refractory to titration of oral diuretics so was admitted for IV diuresis. Did not respond well to IV lasix. Developed renal failure.  Moved to ICU due to hypotension. Urine with many bacteria. Developed urosepsis. Milrinone stopped norepi started.   Started on CVVHD 07/26/15.   HD cath replaced and now LIJ as of 07/29/15 2/2 leaking and mal-positioning.   Remains on norepi 5 mcg + milrionoe 0.125 mcg. Todays CO-OX 59%.   UA- many bacteria. Insignificant growth UCx 07/24/15 - Insignificant growth Blood Cultures 07/24/15- No growth Blood Cultures 07/30/15 - NGTD   Objective:   Weight Range:  Vital Signs:   Temp:  [97 F (36.1 C)-98.1 F (36.7 C)] 97.7 F (36.5 C) (05/15 0736) Resp:  [14-29] 26 (05/15 0700) BP: (82-157)/(34-115) 103/42 mmHg (05/15 0200) SpO2:  [91 %-100 %] 100 % (05/15 0700) Arterial Line BP: (93-119)/(41-47) 119/46 mmHg (05/15 0700) Weight:  [175 lb 11.3 oz (79.7 kg)] 175 lb 11.3 oz (79.7 kg) (05/15 0500) Last BM Date: 08/03/15  Weight change: Filed Weights   08/02/15 0500 08/03/15 0500 08/04/15 0500  Weight: 178 lb 9.2 oz (81 kg) 177 lb 4 oz (80.4 kg) 175 lb 11.3 oz (79.7 kg)    Intake/Output:   Intake/Output Summary (Last 24 hours) at 08/04/15 0749 Last data filed at 08/04/15 0736  Gross per 24 hour  Intake 2107.45 ml  Output   1857 ml  Net 250.45 ml     Physical Exam: CVP 9 General: Elderly male. Lying in bed NAD. HEENT: normal Neck: supple. LIJ trialysis cath.  JVP not elevated, Carotids 2+ bilat; no bruits. No thyromegaly or nodule noted.  Cor: PMI laterally displaced. IRR  Lungs: Decreased in bases.On 2 L via [place]. Abdomen: soft, NT, ND, no HSM. No bruits or masses. +BS , no HSM. No bruits or masses. +BS  Extremities: no cyanosis, clubbing, rash.  No edema.  Neuro: alert & orientedx3, cranial nerves grossly intact. moves all 4 extremities w/o difficulty. Affect pleasant  Telemetry: Reviewed personally, AF 90s   Labs: Basic Metabolic Panel:  Recent Labs Lab 07/31/15 0450  08/01/15 0410  08/02/15 0543 08/02/15 1544 08/03/15 0640 08/03/15 1600 08/04/15 0505  NA 134*  < > 133*  135  < > 134*  135 131* 136 130* 134*  K 4.0  < > 3.9  3.9  < > 4.0  4.0 3.9 4.3 4.1 4.3  CL 97*  < > 97*  98*  < > 99*  99* 98* 101 97* 101  CO2 21*  < > 22  22  < > 23  23 22 25 24 23   GLUCOSE 288*  < > 233*  237*  < > 205*  200* 270* 139* 247* 104*  BUN 24*  < > 29*  29*  < > 28*  28* 28* 26* 22* 20  CREATININE 1.90*  < > 1.89*  1.90*  < > 1.76*  1.65*  1.68* 1.62* 1.65* 1.68*  CALCIUM 8.0*  < > 8.4*  8.4*  < > 8.2*  8.2* 7.8* 8.3* 7.9* 8.3*  MG 2.4  --  2.4  --  2.5*  --  2.3  --  2.3  PHOS 2.3*  < > 2.2*  < > 3.5 2.2* 2.1* 2.1* 2.2*  < > = values in this interval not displayed.  Liver Function Tests:  Recent Labs Lab 08/02/15 0543 08/02/15 1544 08/03/15 0640 08/03/15 1600 08/04/15 0505  ALBUMIN 2.0* 1.9* 1.9* 1.8* 1.9*   No results for input(s): LIPASE, AMYLASE in the last 168 hours. No results for input(s): AMMONIA in the last 168 hours.  CBC:  Recent Labs Lab 07/31/15 0450 08/01/15 0410 08/02/15 0543 08/03/15 0640 08/04/15 0505  WBC 21.3* 22.1* 18.8* 18.8* 21.2*  HGB 8.8* 8.9* 8.8* 9.0* 9.0*  HCT 25.6* 26.0* 26.1* 26.0* 25.7*  MCV 94.1 94.5 96.0 97.7 95.5  PLT 130* 148* 133* 129* 121*    Cardiac Enzymes: No results for input(s): CKTOTAL, CKMB, CKMBINDEX, TROPONINI in the last 168 hours.  BNP: BNP (last 3  results)  Recent Labs  07/21/15 1838  BNP 1660.5*    ProBNP (last 3 results) No results for input(s): PROBNP in the last 8760 hours.    Other results:  Imaging: No results found.   Medications:     Scheduled Medications: . anidulafungin  100 mg Intravenous Q24H  . antiseptic oral rinse  7 mL Mouth Rinse BID  . atorvastatin  40 mg Oral q1800  . feeding supplement (ENSURE ENLIVE)  237 mL Oral BID BM  . feeding supplement (PRO-STAT SUGAR FREE 64)  30 mL Oral TID BM  . ferrous sulfate  325 mg Oral Q breakfast  . imipenem-cilastatin  500 mg Intravenous Q8H  . insulin aspart  0-15 Units Subcutaneous TID WC  . insulin aspart  0-5 Units Subcutaneous QHS  . sodium chloride flush  10-40 mL Intracatheter Q12H  . sodium chloride flush  3 mL Intravenous Q12H    Infusions: . sodium chloride Stopped (07/29/15 1100)  . amiodarone 30 mg/hr (08/04/15 0533)  . heparin 1,100 Units/hr (08/03/15 1003)  . milrinone 0.125 mcg/kg/min (08/03/15 0802)  . norepinephrine (LEVOPHED) Adult infusion 5.2 mcg/min (08/04/15 0430)  . dialysis replacement fluid (prismasate) 200 mL/hr at 08/03/15 0800  . dialysis replacement fluid (prismasate) 400 mL/hr at 08/03/15 1551  . dialysate (PRISMASATE) 1,000 mL/hr at 08/03/15 2336    PRN Medications: sodium chloride, acetaminophen, alteplase, fentaNYL (SUBLIMAZE) injection, guaiFENesin, heparin, heparin, heparin, levalbuterol, ondansetron (ZOFRAN) IV, phenol, sodium chloride flush, sodium chloride flush, traMADol   Assessment:   1.Urosepsis 2. Acute on chronic systolic HF: Ischemic cardiomyopathy, EF 35% with wall motion abnormalities by echo.  3. Chronic AF now with RVR controlled by amiodarone.  4. Ischemic CM EF 20-25% 5. Acute on chronic renal failure, stage IV -> CVVHD started 5/6. Suspect hemodynamically-mediated ATN.  6. CAD s/p anterior MI followed by CABG and PCI RCA 7. Acute respiratory failure 8. Hypotension: mixed septic/cardiogenic shock.   9. Hypokalemia 10. Acute Gout Flare    Plan/Discussion:    Suspect mixed septic/cardiogenic shock with AKI/ATN. Remains anuric on CVVH.  Norepinephrine at  5 today. + milrinone 0.125 yesterday. CVP in 9.  CVVH running even.   On vancomycin and imipenem + anidulafungin. WBCs, afebrile.  Cultures remain negative.  Continue current antimicrobials.      Remains on amio for rate control of chronic AF.  HR stable in 80s. Off apixaban  for now and on heparin gtt.   Continue CVVH for now. Will need to follow up later today .Will likely need to transition to Hospice.   Amy Clegg NP-C  7:49 AM  08/04/2015  Advanced Heart Failure Team Pager [phone] (M-F; 7a - 4p)  Please contact [facility] for night-coverage after hours (4p -7a ) and weekends on [email or web address]  Patient seen and examined with Darrick Grinder, NP. We discussed all aspects of the encounter. I agree with the assessment and plan as stated above.   He remains very tenuous. A-line placed last night. On dual pressors. Co-ox 59% CVP 8. Remains on CVVHD. We preformed bladder scan personally and 250cc in bladder will place Foley. Hopefully will get some renal recovery. Keep even for now. Continue heparin. AF rate controlled on amio.   The patient is critically ill with multiple organ systems failure and requires high complexity decision making for assessment and support, frequent evaluation and titration of therapies, application of advanced monitoring technologies and extensive interpretation of multiple databases.   Critical Care Time devoted to patient care services described in this note is 35 Minutes.  Lorenz Donley,MD 12:28 PM

## 2015-08-04 NOTE — Progress Notes (Signed)
ANTICOAGULATION CONSULT NOTE - Follow Up Consult  Pharmacy Consult for Heparin  Indication: atrial fibrillation  No Known Allergies  Patient Measurements: Height: 5\' 7"  (170.2 cm) Weight: 175 lb 11.3 oz (79.7 kg) IBW/kg (Calculated) : 66.1  Vital Signs: Temp: 97.7 F (36.5 C) (05/15 0736) Temp Source: Oral (05/15 0736) BP: 103/42 mmHg (05/15 0200)  Labs:  Recent Labs  08/01/15 1515  08/02/15 0543  08/03/15 0602 08/03/15 0640 08/03/15 1600 08/04/15 0505  HGB  --   < > 8.8*  --   --  9.0*  --  9.0*  HCT  --   --  26.1*  --   --  26.0*  --  25.7*  PLT  --   --  133*  --   --  129*  --  121*  APTT 74*  --  68*  --   --   --   --   --   HEPARINUNFRC  --   --  0.61  < > 0.16* 0.44  --  0.30  CREATININE  --   < > 1.76*  1.65*  < >  --  1.62* 1.65* 1.68*  < > = values in this interval not displayed.  Estimated Creatinine Clearance: 34.3 mL/min (by C-G formula based on Cr of 1.68).   Assessment: 82 yo M admitted with low output HF. Pt has chronic afib and was taking apixaban PTA. Apixaban was initially continued during admission, but it was stopped and changed to heparin on 5/6 when the decision was made to initiate CVVHD due to acute renal failure.  Heparin drip 1100 units/hr HL 0.3 no bleeding at goal. CBC stable, no bleeding.   Goal of Therapy:  Heparin level 0.3-0.7 units/ml aPTT 66-102 seconds Monitor platelets by anticoagulation protocol: Yes   Plan:  Continue heparin at 1100 units/hr  Daily CBC/HL Monitor for bleeding  Bonnita Nasuti Pharm.D. CPP, BCPS Clinical Pharmacist [phone] 08/04/2015 7:58 AM

## 2015-08-04 NOTE — Procedures (Signed)
Admit: 07/21/2015 LOS: 36  81M with CKD4 with AoCKD related to decomp sHF exacerbation and UTI  Current CRRT Prescription: Start Date: 07/26/15 Catheter: L IJ TDC BFR: 200 Pre Blood Pump: 400 4K DFR: 1000 4K Replacement Rate: 200 4K Goal UF: net even Anticoagulation: systemic heparin Clotting: infrequent  S: Foley placed with grossly blood urine, about 285mL, no other UOP Remains on NE and Milrione Seen by AHF this AM Son and wife at bedside  O: 05/14 0701 - 05/15 0700 In: 2067.5 [P.O.:695; I.V.:842.5; IV Piggyback:530] Out: 1857 [Urine:140]  Filed Weights   08/02/15 0500 08/03/15 0500 08/04/15 0500  Weight: 81 kg (178 lb 9.2 oz) 80.4 kg (177 lb 4 oz) 79.7 kg (175 lb 11.3 oz)     Recent Labs Lab 08/03/15 0640 08/03/15 1600 08/04/15 0505  NA 136 130* 134*  K 4.3 4.1 4.3  CL 101 97* 101  CO2 25 24 23   GLUCOSE 139* 247* 104*  BUN 26* 22* 20  CREATININE 1.62* 1.65* 1.68*  CALCIUM 8.3* 7.9* 8.3*  PHOS 2.1* 2.1* 2.2*    Recent Labs Lab 08/02/15 0543 08/03/15 0640 08/04/15 0505  WBC 18.8* 18.8* 21.2*  HGB 8.8* 9.0* 9.0*  HCT 26.1* 26.0* 25.7*  MCV 96.0 97.7 95.5  PLT 133* 129* 121*    Scheduled Meds: . anidulafungin  100 mg Intravenous Q24H  . antiseptic oral rinse  7 mL Mouth Rinse BID  . atorvastatin  40 mg Oral q1800  . feeding supplement (ENSURE ENLIVE)  237 mL Oral BID BM  . feeding supplement (PRO-STAT SUGAR FREE 64)  30 mL Oral TID BM  . ferrous sulfate  325 mg Oral Q breakfast  . imipenem-cilastatin  500 mg Intravenous Q8H  . insulin aspart  0-15 Units Subcutaneous TID WC  . insulin aspart  0-5 Units Subcutaneous QHS  . sodium chloride flush  10-40 mL Intracatheter Q12H  . sodium chloride flush  3 mL Intravenous Q12H   Continuous Infusions: . sodium chloride Stopped (07/29/15 1100)  . amiodarone 30 mg/hr (08/04/15 0800)  . heparin 1,100 Units/hr (08/04/15 1100)  . milrinone 0.125 mcg/kg/min (08/04/15 0800)  . norepinephrine (LEVOPHED) Adult  infusion 5.2 mcg/min (08/04/15 0800)  . dialysis replacement fluid (prismasate) 200 mL/hr at 08/04/15 0859  . dialysis replacement fluid (prismasate) 400 mL/hr at 08/03/15 1551  . dialysate (PRISMASATE) 1,000 mL/hr at 08/04/15 1007   PRN Meds:.sodium chloride, acetaminophen, alteplase, fentaNYL (SUBLIMAZE) injection, guaiFENesin, heparin, heparin, heparin, levalbuterol, ondansetron (ZOFRAN) IV, phenol, sodium chloride flush, sodium chloride flush, traMADol  ABG    Component Value Date/Time   PHART 7.379 12/29/2012 2207   PCO2ART 43.5 12/29/2012 2207   PO2ART 88.0 12/29/2012 2207   HCO3 25.4* 12/29/2012 2207   TCO2 27 12/29/2012 2207   O2SAT 59.1 08/04/2015 0534    A/P  1. Dialysis dependent AoCKD4 (BL 1.8) 2/2 cardiorenal syndrome  1. Cont at current settigns 2. Tried to established realistic expectations with family today 3. Watch/Trend UOP 4. Watch phos, replace if < 2 2. Decompensated systolic HF on milrinone and NE 3. UTI 4. Permanent AFib on amio, systemic heparin 5. ASCVD, hx/o CABG and PCI 6. Shock   Pearson Grippe, MD [facility] pgr [phone]

## 2015-08-04 NOTE — Procedures (Signed)
Arterial Catheter Insertion Procedure Note DAC[facility] SURFACE [id number] 1930/04/06  Procedure: Insertion of Arterial Catheter  Indications: Blood pressure monitoring and Frequent blood sampling  Procedure Details Consent: Risks of procedure as well as the alternatives and risks of each were explained to the (patient/caregiver).  Consent for procedure obtained. Time Out: Verified patient identification, verified procedure, site/side was marked, verified correct patient position, special equipment/implants available, medications/allergies/relevent history reviewed, required imaging and test results available.  Performed  Maximum sterile technique was used including antiseptics, cap, gloves, gown, hand hygiene, mask and sheet. Skin prep: Chlorhexidine; local anesthetic administered 22 gauge catheter was inserted into left radial artery using the Seldinger technique.  Evaluation Blood flow good; BP tracing good. Complications: No apparent complications.   Kelle Darting 08/04/2015 142

## 2015-08-05 DIAGNOSIS — Z515 Encounter for palliative care: Secondary | ICD-10-CM | POA: Insufficient documentation

## 2015-08-05 DIAGNOSIS — Z66 Do not resuscitate: Secondary | ICD-10-CM

## 2015-08-05 LAB — CARBOXYHEMOGLOBIN
Carboxyhemoglobin: 1.6 % — ABNORMAL HIGH (ref 0.5–1.5)
Methemoglobin: 0.7 % (ref 0.0–1.5)
O2 Saturation: 57.4 %
Total hemoglobin: 8.6 g/dL — ABNORMAL LOW (ref 13.5–18.0)

## 2015-08-05 LAB — CULTURE, BLOOD (ROUTINE X 2)
Culture: NO GROWTH
Culture: NO GROWTH

## 2015-08-05 LAB — RENAL FUNCTION PANEL
Albumin: 1.8 g/dL — ABNORMAL LOW (ref 3.5–5.0)
Anion gap: 11 (ref 5–15)
BUN: 19 mg/dL (ref 6–20)
CALCIUM: 8.3 mg/dL — AB (ref 8.9–10.3)
CO2: 25 mmol/L (ref 22–32)
CREATININE: 1.7 mg/dL — AB (ref 0.61–1.24)
Chloride: 98 mmol/L — ABNORMAL LOW (ref 101–111)
GFR calc Af Amer: 41 mL/min — ABNORMAL LOW (ref >60)
GFR calc non Af Amer: 36 mL/min — ABNORMAL LOW (ref >60)
GLUCOSE: 202 mg/dL — AB (ref 65–99)
Phosphorus: 2.3 mg/dL — ABNORMAL LOW (ref 2.5–4.6)
Potassium: 4.2 mmol/L (ref 3.5–5.1)
SODIUM: 134 mmol/L — AB (ref 135–145)

## 2015-08-05 LAB — CBC
HCT: 25 % — ABNORMAL LOW (ref 39.0–52.0)
Hemoglobin: 8.8 g/dL — ABNORMAL LOW (ref 13.0–17.0)
MCH: 33.2 pg (ref 26.0–34.0)
MCHC: 35.2 g/dL (ref 30.0–36.0)
MCV: 94.3 fL (ref 78.0–100.0)
PLATELETS: 113 10*3/uL — AB (ref 150–400)
RBC: 2.65 MIL/uL — ABNORMAL LOW (ref 4.22–5.81)
RDW: 16.7 % — ABNORMAL HIGH (ref 11.5–15.5)
WBC: 19.9 10*3/uL — ABNORMAL HIGH (ref 4.0–10.5)

## 2015-08-05 LAB — MAGNESIUM: MAGNESIUM: 2.3 mg/dL (ref 1.7–2.4)

## 2015-08-05 LAB — GLUCOSE, CAPILLARY
Glucose-Capillary: 134 mg/dL — ABNORMAL HIGH (ref 65–99)
Glucose-Capillary: 125 mg/dL — ABNORMAL HIGH (ref 65–99)

## 2015-08-05 LAB — HEPARIN LEVEL (UNFRACTIONATED): Heparin Unfractionated: 0.23 IU/mL — ABNORMAL LOW (ref 0.30–0.70)

## 2015-08-05 MED ORDER — METOLAZONE 5 MG PO TABS
5.0000 mg | ORAL_TABLET | Freq: Once | ORAL | Status: AC
  Administered 2015-08-05: 5 mg via ORAL
  Filled 2015-08-05: qty 1

## 2015-08-05 MED ORDER — FENTANYL CITRATE (PF) 100 MCG/2ML IJ SOLN
50.0000 ug | INTRAMUSCULAR | Status: DC | PRN
  Administered 2015-08-06: 50 ug via INTRAVENOUS
  Filled 2015-08-05: qty 2

## 2015-08-05 MED ORDER — ENOXAPARIN SODIUM 30 MG/0.3ML ~~LOC~~ SOLN: 30.0000 mg | SUBCUTANEOUS | Status: DC

## 2015-08-05 MED ORDER — FUROSEMIDE 10 MG/ML IJ SOLN
200.0000 mg | Freq: Once | INTRAVENOUS | Status: AC
  Administered 2015-08-05: 200 mg via INTRAVENOUS
  Filled 2015-08-05 (×3): qty 20

## 2015-08-05 NOTE — Progress Notes (Signed)
Patient seen and met with wife and patient.  CVVH discontinued and now on Hospice. Talked about EOL issues.   Would like to try single shot of lasix /metolazone to see if any effect on UOP. Not much more beyond that.   Kerry Hough MD [facility] 1:41 PM

## 2015-08-05 NOTE — Consult Note (Signed)
Consultation Note Date: 08/05/2015   Patient Name: Martin [name]  DOB: [birth year]  MRN: [id number]  Age / Sex: 82 y.o., male  PCP: Lajean Manes, MD Referring Physician: Jacolyn Reedy, MD  Reason for Consultation: Establishing goals of care, Hospice Evaluation, Non pain symptom management, Pain control and Psychosocial/spiritual support  HPI/Patient Profile: 82 y.o. male   admitted on 07/21/2015 with . Mr. Detloff is an 82 year old male with h/o CAD s/p anterior MI in 1985 followed by CABG and PCI of RCA, chronic systolic HF EF 38%, chronic AF and CKD followed by Dr. Wynonia Lawman.   Prior to admission had been struggling with progressive volume overload, fatigue and DOE. Saw Dr. Wynonia Lawman in the office and was refractory to titration of oral diuretics so was admitted for IV diuresis. Did not respond well to IV lasix. Developed renal failure.  Moved to ICU due to hypotension. Urine with many bacteria. Developed urosepsis. Started on CVVHD 07/26/15.    Continued physical decline, CRF, complex cardiac disease, patient and  family faced with advanced directive dec ions and anticipatory care needs.  Clinical Assessment and Goals of Care:   This NP Wadie Lessen reviewed medical records, received report from team, assessed the patient and then meet at the patient's bedside along with his wife and two sons,  to discuss diagnosis prognosis, GOC, EOL wishes disposition and options.   A detailed discussion was had today regarding advanced directives.  Concepts specific to code status, artifical feeding and hydration, continued IV antibiotics and rehospitalization was had.  The difference between a aggressive medical intervention path  and a palliative comfort care path for this patient at this time was had.  Values and goals of care important to patient and family were attempted to be elicited.  Concept of Hospice and Palliative Care  were discussed  Natural trajectory and expectations at EOL were discussed.  Questions and concerns addressed. Family encouraged to call with questions or concerns.  PMT will continue to support holistically.    SUMMARY OF RECOMMENDATIONS    - Focus of care is comfort, quality and dignity  - No escalation of care - no further labs sticks, CBGs (per patient)  - continue levophed/milronone gtts through the night, re-evaluate in the morning per Dr Wynonia Lawman  - re-evaluate in the morning for further clarification of goals as it relates to disposition    Code Status/Advance Care Planning:  DNR   De-activate ICD/ awaiting Medtronic    Palliative Prophylaxis:   Bowel Regimen, Frequent Pain Assessment and Oral Care    Psycho-social/Spiritual:   Desire for further Chaplaincy support:no- strong community church support  Additional Recommendations: Education on Hospice  Prognosis:   If urine OP does not improve, likely weeks  Discharge Planning: To Be Determined      Primary Diagnoses: Present on Admission:  . Acute on chronic systolic congestive heart failure ([place]) . Acute on chronic systolic (congestive) heart failure ([place])  I have reviewed the medical record, interviewed the patient and family, and examined the patient.  The following aspects are pertinent.  Past Medical History  Diagnosis Date  . Gout   . Hypertension   . Old anterior wall myocardial infarction   . Thrombocytopenia, immune   . Atrial fibrillation ([place])   . Hyperlipidemia   . ICD-Medtronic 11/25/2008    09/28/2007  Medtronic Sprint Quattro Secure S model 6935-65 (serial # [id number])                 Medtronic Virtuoso VR model D15VWC (serial # [id number]) single chamber ICD   . CAD (coronary artery disease)     1985 Anterior MI 1985 with LIMA to LAD [place] PTCA to RCA x2 July 2009 Cath EF 20-25%  Patent LIMA to LAD, occluded LAD, Mild to moderate disease in RCA and Circ July 2014 Lexiscan  Myoview  EF 32% anterior MI, no ischemia    . History of breast cancer in male   . Ischemic cardiomyopathy   . Osteoarthritis   . Breast cancer ([place])     dx at age 55  . BRCA2 genetic carrier 2014  . CHF (congestive heart failure) ([place])   . Shortness of breath dyspnea    Social History   Social History  . Marital Status: Married    Spouse Name: N/A  . Number of Children: 3  . Years of Education: N/A   Social History Main Topics  . Smoking status: Former Smoker -- 2.00 packs/day for 10 years    Types: Cigarettes    Quit date: 03/22/1964  . Smokeless tobacco: Never Used  . Alcohol Use: Yes  . Drug Use: No  . Sexual Activity: Not Asked   Other Topics Concern  . None   Social History Narrative   Family History  Problem Relation Age of Onset  . Heart attack Father   . Cancer Mother     dx in her 66s, ended up in her bones  . Cancer Brother     ended up in his bones  . Breast cancer Paternal Aunt     died in her late 55s  . Lung cancer Paternal Uncle   . Cancer Maternal Grandmother     ended up in her bones  . Colon cancer Paternal Grandmother   . Congestive Heart Failure Father    Scheduled Meds: . antiseptic oral rinse  7 mL Mouth Rinse BID  . [START ON 08/06/2015] enoxaparin (LOVENOX) injection  30 mg Subcutaneous Q24H  . feeding supplement (ENSURE ENLIVE)  237 mL Oral BID BM  . feeding supplement (PRO-STAT SUGAR FREE 64)  30 mL Oral TID BM  . furosemide  200 mg Intravenous Once  . predniSONE  40 mg Oral Q breakfast  . sodium chloride flush  10-40 mL Intracatheter Q12H  . sodium chloride flush  3 mL Intravenous Q12H   Continuous Infusions: . sodium chloride Stopped (07/29/15 1100)  . amiodarone 30 mg/hr (08/05/15 0758)  . milrinone 0.125 mcg/kg/min (08/04/15 2000)  . norepinephrine (LEVOPHED) Adult infusion 5 mcg/min (08/05/15 1300)   PRN Meds:.sodium chloride, acetaminophen, alteplase, fentaNYL (SUBLIMAZE) injection, guaiFENesin, levalbuterol, ondansetron  (ZOFRAN) IV, phenol, sodium chloride flush, sodium chloride flush, traMADol Medications Prior to Admission:  Prior to Admission medications   Medication Sig Start Date End Date Taking? Authorizing Provider  apixaban (ELIQUIS) 2.5 MG TABS tablet Take 1 tablet (2.5 mg total) by mouth 2 (two) times daily. Patient taking differently: Take 2.5 mg by mouth 2 (two) times daily.  01/04/13  Yes Joshua Chadwell, PA-C  atorvastatin (  LIPITOR) 40 MG tablet Take 40 mg by mouth daily.   Yes Historical Provider, MD  ferrous sulfate 325 (65 FE) MG tablet Take 325 mg by mouth daily with breakfast.   Yes Historical Provider, MD  furosemide (LASIX) 80 MG tablet Take 80 mg by mouth 2 (two) times daily.   Yes Historical Provider, MD  metoprolol (LOPRESSOR) 50 MG tablet Take 50 mg by mouth 2 (two) times daily.     Yes Historical Provider, MD  multivitamin [facility]) per tablet Take 1 tablet by mouth daily.     Yes Historical Provider, MD  potassium chloride SA (K-DUR,KLOR-CON) 20 MEQ tablet Take 20 mEq by mouth daily.     Yes Historical Provider, MD  terazosin (HYTRIN) 5 MG capsule Take 1 capsule (5 mg total) by mouth at bedtime. 01/05/13  Yes Joshua Chadwell, PA-C  valsartan (DIOVAN) 80 MG tablet Take 40 mg by mouth daily. 01/05/13  Yes Joshua Chadwell, PA-C  vitamin C (ASCORBIC ACID) 500 MG tablet Take 500 mg by mouth daily.     Yes Historical Provider, MD   No Known Allergies Review of Systems  Constitutional: Positive for activity change and fatigue.  Respiratory: Positive for shortness of breath.     Physical Exam  Constitutional: He appears ill.  Cardiovascular: Tachycardia present.   Pulmonary/Chest: He has decreased breath sounds.  Neurological: He is alert.  Skin: Skin is warm and dry.    Vital Signs: BP 114/49 mmHg  Pulse 103  Temp(Src) 98.5 F (36.9 C) (Oral)  Resp 17  Ht 5' 7"  (1.702 m)  Wt 80.1 kg (176 lb 9.4 oz)  BMI 27.65 kg/m2  SpO2 100% Pain Assessment: No/denies pain   Pain  Score: Asleep   SpO2: SpO2: 100 % O2 Device:SpO2: 100 % O2 Flow Rate: .O2 Flow Rate (L/min): 2 L/min  IO: Intake/output summary:  Intake/Output Summary (Last 24 hours) at 08/05/15 1618 Last data filed at 08/05/15 1300  Gross per 24 hour  Intake 1509.19 ml  Output   1561 ml  Net -51.81 ml    LBM: Last BM Date: 08/04/15 Baseline Weight: Weight: 87.68 kg (193 lb 4.8 oz) Most recent weight: Weight: 80.1 kg (176 lb 9.4 oz)      Palliative Assessment/Data: 20 %   Discussed with Dr Wynonia Lawman  Time In: 1500 Time Out: 1630 Time Total: 90 min Greater than 50%  of this time was spent counseling and coordinating care related to the above assessment and plan.  Signed by: Wadie Lessen, NP   Please contact Palliative Medicine Team phone at [phone] for questions and concerns.  For individual provider: See Shea Evans

## 2015-08-05 NOTE — Procedures (Signed)
Admit: 07/21/2015 LOS: 62  86M with CKD4 with AoCKD related to decomp sHF exacerbation and UTI  Current CRRT Prescription: Start Date: 07/26/15 Catheter: L IJ TDC BFR: 200 Pre Blood Pump: 400 4K DFR: 1000 4K Replacement Rate: 200 4K Goal UF: net even Anticoagulation: systemic heparin Clotting: infrequent  S: Minimal UOP after foley placed Remains on NE and Milrione Seen by AHF this AM wife at bedside Discussed big picture, esp renal; not a candidate for transition to iHD, no real cardiac improvement Offered palliative care engagement, they are open to discussing with them  O: 05/15 0701 - 05/16 0700 In: 1997 [P.O.:510; I.V.:887; IV Piggyback:580] Out: 1986 [Urine:351]  Filed Weights   08/03/15 0500 08/04/15 0500 08/05/15 0500  Weight: 80.4 kg (177 lb 4 oz) 79.7 kg (175 lb 11.3 oz) 80.1 kg (176 lb 9.4 oz)     Recent Labs Lab 08/04/15 0505 08/04/15 1645 08/05/15 0415  NA 134* 135 134*  K 4.3 4.3 4.2  CL 101 101 98*  CO2 23 24 25   GLUCOSE 104* 101* 202*  BUN 20 21* 19  CREATININE 1.68* 1.72* 1.70*  CALCIUM 8.3* 8.3* 8.3*  PHOS 2.2* 2.9 2.3*    Recent Labs Lab 08/03/15 0640 08/04/15 0505 08/05/15 0415  WBC 18.8* 21.2* 19.9*  HGB 9.0* 9.0* 8.8*  HCT 26.0* 25.7* 25.0*  MCV 97.7 95.5 94.3  PLT 129* 121* 113*    Scheduled Meds: . anidulafungin  100 mg Intravenous Q24H  . antiseptic oral rinse  7 mL Mouth Rinse BID  . atorvastatin  40 mg Oral q1800  . feeding supplement (ENSURE ENLIVE)  237 mL Oral BID BM  . feeding supplement (PRO-STAT SUGAR FREE 64)  30 mL Oral TID BM  . ferrous sulfate  325 mg Oral Q breakfast  . imipenem-cilastatin  500 mg Intravenous Q8H  . insulin aspart  0-15 Units Subcutaneous TID WC  . insulin aspart  0-5 Units Subcutaneous QHS  . predniSONE  40 mg Oral Q breakfast  . sodium chloride flush  10-40 mL Intracatheter Q12H  . sodium chloride flush  3 mL Intravenous Q12H  . vancomycin  750 mg Intravenous Q24H   Continuous  Infusions: . sodium chloride Stopped (07/29/15 1100)  . amiodarone 30 mg/hr (08/05/15 0758)  . heparin 1,100 Units/hr (08/04/15 2000)  . milrinone 0.125 mcg/kg/min (08/04/15 2000)  . norepinephrine (LEVOPHED) Adult infusion 7 mcg/min (08/05/15 0515)  . dialysis replacement fluid (prismasate) 200 mL/hr at 08/04/15 0859  . dialysis replacement fluid (prismasate) 400 mL/hr at 08/05/15 0607  . dialysate (PRISMASATE) 1,000 mL/hr at 08/05/15 0145   PRN Meds:.sodium chloride, acetaminophen, alteplase, fentaNYL (SUBLIMAZE) injection, guaiFENesin, heparin, heparin, heparin, levalbuterol, ondansetron (ZOFRAN) IV, phenol, sodium chloride flush, sodium chloride flush, traMADol  ABG    Component Value Date/Time   PHART 7.379 12/29/2012 2207   PCO2ART 43.5 12/29/2012 2207   PO2ART 88.0 12/29/2012 2207   HCO3 25.4* 12/29/2012 2207   TCO2 27 12/29/2012 2207   O2SAT 57.4 08/05/2015 0420    A/P  1. Dialysis dependent AoCKD4 (BL 1.8) 2/2 cardiorenal syndrome  1. Cont at current settigns 2. Tried to established realistic expectations with family today 3. Watch/Trend UOP 4. Watch phos, replace if < 2 2. Decompensated systolic HF on milrinone and NE 3. UTI 4. Permanent AFib on amio, systemic heparin 5. ASCVD, hx/o CABG and PCI 6. Shock 7. Goals of Care: will consult palliative care  Pearson Grippe, MD [facility] pgr [phone]

## 2015-08-05 NOTE — Progress Notes (Signed)
Patient ID: Martin [name], male   DOB: 03-Feb-1931, 82 y.o.   MRN: [id number]    Advanced Heart Failure Rounding Note   Subjective:    Martin [name] is an 82 year old male with h/o CAD s/p anterior MI in 1985 followed by CABG and PCI of RCA, chronic systolic HF EF 123456, chronic AF and CKD followed by Dr. Wynonia Lawman. We have been asked to consult for possible low output HF.   Over past few weeks has been struggling with progressive volume overload, fatigue and DOE. Saw Dr. Wynonia Lawman in the office and was refractory to titration of oral diuretics so was admitted for IV diuresis. Did not respond well to IV lasix. Developed renal failure.  Moved to ICU due to hypotension. Urine with many bacteria. Developed urosepsis. Milrinone stopped norepi started.   Started on CVVHD 07/26/15.   HD cath replaced and now LIJ as of 07/29/15 2/2 leaking and mal-positioning.   Remains on norepi 7 mcg + milrionoe 0.125 mcg. Todays CO-OX 57%. Making about 15 cc every couple of hours.  No complaints.   UA- many bacteria. Insignificant growth UCx 07/24/15 - Insignificant growth Blood Cultures 07/24/15- No growth Blood Cultures 07/30/15 - NGTD   Objective:   Weight Range:  Vital Signs:   Temp:  [97.5 F (36.4 C)-98 F (36.7 C)] 97.5 F (36.4 C) (05/16 0400) Pulse Rate:  [96] 96 (05/15 1600) Resp:  [18-30] 23 (05/16 0700) BP: (114)/(49) 114/49 mmHg (05/15 1600) SpO2:  [91 %-100 %] 95 % (05/16 0700) Arterial Line BP: (92-119)/(39-51) 107/45 mmHg (05/16 0700) Weight:  [176 lb 9.4 oz (80.1 kg)] 176 lb 9.4 oz (80.1 kg) (05/16 0500) Last BM Date: 08/04/15  Weight change: Filed Weights   08/03/15 0500 08/04/15 0500 08/05/15 0500  Weight: 177 lb 4 oz (80.4 kg) 175 lb 11.3 oz (79.7 kg) 176 lb 9.4 oz (80.1 kg)    Intake/Output:   Intake/Output Summary (Last 24 hours) at 08/05/15 0753 Last data filed at 08/05/15 0707  Gross per 24 hour  Intake   1987 ml  Output   1986 ml  Net      1 ml     Physical Exam: CVP 7-8    General: Elderly male. Lying in bed NAD. HEENT: normal Neck: supple. LIJ trialysis cath. JVP not elevated, Carotids 2+ bilat; no bruits. No thyromegaly or nodule noted.  Cor: PMI laterally displaced. IRR  Lungs: Decreased in bases.On 2 L via [place]. Abdomen: soft, NT, ND, no HSM. No bruits or masses. +BS , no HSM. No bruits or masses. +BS  Extremities: no cyanosis, clubbing, rash.  No edema.  Neuro: alert & orientedx3, cranial nerves grossly intact. moves all 4 extremities w/o difficulty. Affect pleasant  Telemetry: Reviewed personally, AF 90s   Labs: Basic Metabolic Panel:  Recent Labs Lab 08/01/15 0410  08/02/15 0543  08/03/15 0640 08/03/15 1600 08/04/15 0505 08/04/15 1645 08/05/15 0415  NA 133*  135  < > 134*  135  < > 136 130* 134* 135 134*  K 3.9  3.9  < > 4.0  4.0  < > 4.3 4.1 4.3 4.3 4.2  CL 97*  98*  < > 99*  99*  < > 101 97* 101 101 98*  CO2 22  22  < > 23  23  < > 25 24 23 24 25   GLUCOSE 233*  237*  < > 205*  200*  < > 139* 247* 104* 101* 202*  BUN 29*  29*  < >  28*  28*  < > 26* 22* 20 21* 19  CREATININE 1.89*  1.90*  < > 1.76*  1.65*  < > 1.62* 1.65* 1.68* 1.72* 1.70*  CALCIUM 8.4*  8.4*  < > 8.2*  8.2*  < > 8.3* 7.9* 8.3* 8.3* 8.3*  MG 2.4  --  2.5*  --  2.3  --  2.3  --  2.3  PHOS 2.2*  < > 3.5  < > 2.1* 2.1* 2.2* 2.9 2.3*  < > = values in this interval not displayed.  Liver Function Tests:  Recent Labs Lab 08/03/15 0640 08/03/15 1600 08/04/15 0505 08/04/15 1645 08/05/15 0415  ALBUMIN 1.9* 1.8* 1.9* 1.9* 1.8*   No results for input(s): LIPASE, AMYLASE in the last 168 hours. No results for input(s): AMMONIA in the last 168 hours.  CBC:  Recent Labs Lab 08/01/15 0410 08/02/15 0543 08/03/15 0640 08/04/15 0505 08/05/15 0415  WBC 22.1* 18.8* 18.8* 21.2* 19.9*  HGB 8.9* 8.8* 9.0* 9.0* 8.8*  HCT 26.0* 26.1* 26.0* 25.7* 25.0*  MCV 94.5 96.0 97.7 95.5 94.3  PLT 148* 133* 129* 121* 113*    Cardiac Enzymes: No results for input(s):  CKTOTAL, CKMB, CKMBINDEX, TROPONINI in the last 168 hours.  BNP: BNP (last 3 results)  Recent Labs  07/21/15 1838  BNP 1660.5*    ProBNP (last 3 results) No results for input(s): PROBNP in the last 8760 hours.    Other results:  Imaging: No results found.   Medications:     Scheduled Medications: . anidulafungin  100 mg Intravenous Q24H  . antiseptic oral rinse  7 mL Mouth Rinse BID  . atorvastatin  40 mg Oral q1800  . feeding supplement (ENSURE ENLIVE)  237 mL Oral BID BM  . feeding supplement (PRO-STAT SUGAR FREE 64)  30 mL Oral TID BM  . ferrous sulfate  325 mg Oral Q breakfast  . imipenem-cilastatin  500 mg Intravenous Q8H  . insulin aspart  0-15 Units Subcutaneous TID WC  . insulin aspart  0-5 Units Subcutaneous QHS  . predniSONE  40 mg Oral Q breakfast  . sodium chloride flush  10-40 mL Intracatheter Q12H  . sodium chloride flush  3 mL Intravenous Q12H  . vancomycin  750 mg Intravenous Q24H    Infusions: . sodium chloride Stopped (07/29/15 1100)  . amiodarone 30 mg/hr (08/04/15 2000)  . heparin 1,100 Units/hr (08/04/15 2000)  . milrinone 0.125 mcg/kg/min (08/04/15 2000)  . norepinephrine (LEVOPHED) Adult infusion 7 mcg/min (08/05/15 0515)  . dialysis replacement fluid (prismasate) 200 mL/hr at 08/04/15 0859  . dialysis replacement fluid (prismasate) 400 mL/hr at 08/05/15 0607  . dialysate (PRISMASATE) 1,000 mL/hr at 08/05/15 0145    PRN Medications: sodium chloride, acetaminophen, alteplase, fentaNYL (SUBLIMAZE) injection, guaiFENesin, heparin, heparin, heparin, levalbuterol, ondansetron (ZOFRAN) IV, phenol, sodium chloride flush, sodium chloride flush, traMADol   Assessment:   1.Urosepsis 2. Acute on chronic systolic HF: Ischemic cardiomyopathy, EF 35% with wall motion abnormalities by echo.  3. Chronic AF now with RVR controlled by amiodarone.  4. Ischemic CM EF 20-25% 5. Acute on chronic renal failure, stage IV -> CVVHD started 5/6. Suspect  hemodynamically-mediated ATN.  6. CAD s/p anterior MI followed by CABG and PCI RCA 7. Acute respiratory failure 8. Hypotension: mixed septic/cardiogenic shock.  9. Hypokalemia 10. Acute Gout Flare    Plan/Discussion:    Suspect mixed septic/cardiogenic shock with AKI/ATN. Remains anuric on CVVH.  Norepinephrine at  5 today. + milrinone 0.125 yesterday. CVP in 7-8.  CVVH running even.   On vancomycin and imipenem + anidulafungin. WBCs, afebrile.  Cultures remain negative.  Continue current antimicrobials.      Remains on amio for rate control of chronic AF.  HR stable in 80s. Off apixaban for now and on heparin gtt.   Continue CVVH for now.   Need to discuss goals of care.    Warren Lacy Clegg NP-C  7:53 AM  08/05/2015  Advanced Heart Failure Team Pager [phone] (M-F; 7a - 4p)  Please contact [facility] for night-coverage after hours (4p -7a ) and weekends on [email or web address]  .Patient seen and examined with Darrick Grinder, NP. We discussed all aspects of the encounter. I agree with the assessment and plan as stated above.   Remains anuric. Chance of renal recovery approaching zero. Long talk with patient and family. Will stop CVVHD today and see if kidneys will rebound over next few days. If no recovery will switch to hospice. For now will focus on comfort. Treat gout pain aggressively. ICD deactivated. Stop all non-essential meds. Will consult Palliative Care. Appreciate Renal and Dr. Anthonette Legato input.   The patient is critically ill with multiple organ systems failure and requires high complexity decision making for assessment and support, frequent evaluation and titration of therapies, application of advanced monitoring technologies and extensive interpretation of multiple databases.   Critical Care Time devoted to patient care services described in this note is 35 Minutes.  Yanessa Hocevar,MD 3:03 PM

## 2015-08-06 DIAGNOSIS — R0600 Dyspnea, unspecified: Secondary | ICD-10-CM | POA: Insufficient documentation

## 2015-08-06 MED ORDER — LORAZEPAM 0.5 MG PO TABS: 0.5000 mg | ORAL_TABLET | ORAL | Status: AC | PRN

## 2015-08-06 MED ORDER — LORAZEPAM 0.5 MG PO TABS: 0.5000 mg | ORAL_TABLET | ORAL | Status: DC | PRN

## 2015-08-06 MED ORDER — AMIODARONE HCL 200 MG PO TABS: 200.0000 mg | ORAL_TABLET | Freq: Every day | ORAL | Status: DC

## 2015-08-06 MED ORDER — METOLAZONE 5 MG PO TABS: 5.0000 mg | ORAL_TABLET | Freq: Once | ORAL | Status: DC

## 2015-08-06 MED ORDER — FUROSEMIDE 10 MG/ML IJ SOLN
200.0000 mg | Freq: Once | INTRAVENOUS | Status: DC
  Filled 2015-08-06: qty 20

## 2015-08-06 MED ORDER — MORPHINE SULFATE (CONCENTRATE) 10 MG/0.5ML PO SOLN: 5.0000 mg | ORAL | Status: DC | PRN

## 2015-08-06 MED ORDER — PREDNISONE 20 MG PO TABS: 40.0000 mg | ORAL_TABLET | Freq: Every day | ORAL | Status: AC

## 2015-08-06 NOTE — NC FL2 (Signed)
[place] LEVEL OF CARE SCREENING TOOL     IDENTIFICATION  Patient Name: Martin [name] Birthdate: [birth year] Sex: male Admission Date (Current Location): 07/21/2015  [facility] and [place] Number:  Herbalist and Address:  [facility] [address] [place], [place] [address]      Provider Number: [id number]  Attending Physician Name and Address:  Jacolyn Reedy, MD  Relative Name and Phone Number:       Current Level of Care: Hospital Recommended Level of Care: [place] Prior Approval Number:    Date Approved/Denied:   PASRR Number: [id number]  Discharge Plan: SNF    Current Diagnoses: Patient Active Problem List   Diagnosis Date Noted  . Dyspnea   . DNR (do not resuscitate)   . Palliative care encounter   . Pressure ulcer 08/04/2015  . Acute on chronic renal failure ([place])   . Encounter regarding vascular access for dialysis for ESRD ([place])   . Acute on chronic systolic congestive heart failure ([place]) 07/21/2015  . Acute on chronic systolic (congestive) heart failure ([place]) 07/21/2015  . Breast cancer of lower-inner quadrant of left male breast ([place]) 04/17/2013  . BRCA2 genetic carrier   . Chronic kidney disease, stage III (GFR 30-59 ml/min) 01/05/2013  . Hyperlipidemia   . CAD (coronary artery disease) 12/29/2012  . History of breast cancer in male 12/29/2012  . Long-term (current) use of anticoagulants 12/29/2012  . Hypotension 12/29/2012  . Thrombocytopenia ([place]) 02/05/2011  . Automatic implantable cardioverter-defibrillator in situ 11/25/2008  . Ischemic cardiomyopathy 11/21/2008  . Chronic atrial fibrillation 11/21/2008  . Chronic systolic heart failure ([place]) 11/21/2008  . Gout   . Hypertension    . Osteoarthritis     Orientation RESPIRATION BLADDER Height & Weight     Self, Time, Situation, Place  Normal Continent Weight: 180 lb 12.4 oz (82 kg) Height:  _0  (170.2 cm)  BEHAVIORAL  SYMPTOMS/MOOD NEUROLOGICAL BOWEL NUTRITION STATUS      Incontinent Diet (Please see discharge summary.)  AMBULATORY STATUS COMMUNICATION OF NEEDS Skin   Extensive Assist Verbally PU Stage and Appropriate Care PU Stage 1 Dressing:  (PRN)                     Personal Care Assistance Level of Assistance  Bathing, Feeding, Dressing Bathing Assistance: Maximum assistance Feeding assistance: Maximum assistance Dressing Assistance: Maximum assistance     Functional Limitations Info             SPECIAL CARE FACTORS FREQUENCY   (Hospice)                    Contractures      Additional Factors Info  Code Status, Allergies Code Status Info: DNR Allergies Info: No known allergies           Current Medications (08/06/2015):  This is the current hospital active medication list Current Facility-Administered Medications  Medication Dose Route Frequency Provider Last Rate Last Dose  . 0.9 %  sodium chloride infusion  250 mL Intravenous PRN Jacolyn Reedy, MD   Stopped at 07/30/15 1541  . 0.9 %  sodium chloride infusion   Intravenous Continuous Jacolyn Reedy, MD   Stopped at 07/29/15 1100  . acetaminophen (TYLENOL) tablet 650 mg  650 mg Oral Q4H PRN Jacolyn Reedy, MD   650 mg at 07/22/15 1805  . antiseptic oral rinse (CPC / CETYLPYRIDINIUM CHLORIDE 0.05%) solution  7 mL  7 mL Mouth Rinse BID Jacolyn Reedy, MD   7 mL at 08/05/15 1000  . fentaNYL (SUBLIMAZE) injection 50 mcg  50 mcg Intravenous Q2H PRN Knox Royalty, NP      . guaiFENesin (ROBITUSSIN) 100 MG/5ML solution 100 mg  5 mL Oral Q4H PRN Amy D Clegg, NP   100 mg at 07/29/15 1723  . LORazepam (ATIVAN) tablet 0.5 mg  0.5 mg Oral Q4H PRN Knox Royalty, NP      . morphine CONCENTRATE 10 MG/0.5ML oral solution 5 mg  5 mg Oral Q2H PRN Knox Royalty, NP      . ondansetron [facility]) injection 4 mg  4 mg Intravenous Q6H PRN Jacolyn Reedy, MD      . phenol [facility]) mouth spray 1 spray  1 spray Mouth/Throat PRN  Jules Husbands, MD      . predniSONE (DELTASONE) tablet 40 mg  40 mg Oral Q breakfast Jolaine Artist, MD   40 mg at 08/05/15 0758  . sodium chloride flush (NS) 0.9 % injection 10-40 mL  10-40 mL Intracatheter PRN Jolaine Artist, MD      . sodium chloride flush (NS) 0.9 % injection 10-40 mL  10-40 mL Intracatheter Q12H Jacolyn Reedy, MD   10 mL at 08/05/15 1047  . sodium chloride flush (NS) 0.9 % injection 3 mL  3 mL Intravenous Q12H Jacolyn Reedy, MD   3 mL at 08/04/15 1000  . sodium chloride flush (NS) 0.9 % injection 3 mL  3 mL Intravenous PRN Jacolyn Reedy, MD      . traMADol Veatrice Bourbon) tablet 50 mg  50 mg Oral Q8H PRN Troy Sine, MD   50 mg at 08/04/15 2214     Discharge Medications: Please see discharge summary for a list of discharge medications.  Relevant Imaging Results:  Relevant Lab Results:   Additional Information SSN: [id number]  Caroline Sauger, LCSW

## 2015-08-06 NOTE — Progress Notes (Signed)
Nutrition Brief Note  Chart reviewed. Pt now transitioning to comfort care.  No further nutrition interventions warranted at this time.  Please re-consult as needed.   Aashish Hamm RD, LDN, CNSC [phone] Pager [phone] After Hours Pager    

## 2015-08-06 NOTE — Progress Notes (Signed)
Patient seen.  Off CVVH.  Awakens to voice.  Some clear urine output but around 125 cc in bag. Talked about goals of care yesterday.  Kept here overnight with plans to taper drips and Hospice today.  With some clear urine one additional furosemide and metolazone today.  OK to go to Hospice.   Kerry Hough MD [facility] 8:55 AM

## 2015-08-06 NOTE — Progress Notes (Signed)
Patient ID: Martin [name], male   DOB: [birth year], 82 y.o.   MRN: [id number]    Advanced Heart Failure Rounding Note   Subjective:    Martin [name] is an 82 year old male with h/o CAD s/p anterior MI in 1985 followed by CABG and PCI of RCA, chronic systolic HF EF 123456, chronic AF and CKD followed by Dr. Wynonia [name]. We have been asked to consult for possible low output HF.   Yesterday CVVH stopped.   Comfortable. Denies SOB/pain.     Objective:   Weight Range:  Vital Signs:   Temp:  [97.2 F (36.2 C)-99.1 F (37.3 C)] 97.2 F (36.2 C) (05/17 0812) Resp:  [14-29] 21 (05/17 0700) SpO2:  [96 %-100 %] 100 % (05/17 0700) Arterial Line BP: (81-139)/(40-63) 124/51 mmHg (05/17 0700) Weight:  [180 lb 12.4 oz (82 kg)] 180 lb 12.4 oz (82 kg) (05/17 0500) Last BM Date: 08/04/15  Weight change: Filed Weights   08/04/15 0500 08/05/15 0500 08/06/15 0500  Weight: 175 lb 11.3 oz (79.7 kg) 176 lb 9.4 oz (80.1 kg) 180 lb 12.4 oz (82 kg)    Intake/Output:   Intake/Output Summary (Last 24 hours) at 08/06/15 0858 Last data filed at 08/06/15 0700  Gross per 24 hour  Intake 753.27 ml  Output    466 ml  Net 287.27 ml     Physical Exam: General: Elderly male. Lying in bed NAD.  HEENT: normal Neck: supple. JVP not elevated, Carotids 2+ bilat; no bruits. No thyromegaly or nodule noted.  Cor: PMI laterally displaced. IRR  Lungs: Decreased in bases.On 2 L via [place]. Abdomen: soft, NT, ND, no HSM. No bruits or masses. +BS , no HSM. No bruits or masses. +BS  Extremities: no cyanosis, clubbing, rash.  No edema.  Neuro: alert & orientedx3, cranial nerves grossly intact. moves all 4 extremities w/o difficulty. Affect pleasant  Telemetry: Reviewed personally, AF 90s   Labs: Basic Metabolic Panel:  Recent Labs Lab 08/01/15 0410  08/02/15 0543  08/03/15 0640 08/03/15 1600 08/04/15 0505 08/04/15 1645 08/05/15 0415  NA 133*  135  < > 134*  135  < > 136 130* 134* 135 134*  K 3.9  3.9  < > 4.0   4.0  < > 4.3 4.1 4.3 4.3 4.2  CL 97*  98*  < > 99*  99*  < > 101 97* 101 101 98*  CO2 22  22  < > 23  23  < > 25 24 23 24 25   GLUCOSE 233*  237*  < > 205*  200*  < > 139* 247* 104* 101* 202*  BUN 29*  29*  < > 28*  28*  < > 26* 22* 20 21* 19  CREATININE 1.89*  1.90*  < > 1.76*  1.65*  < > 1.62* 1.65* 1.68* 1.72* 1.70*  CALCIUM 8.4*  8.4*  < > 8.2*  8.2*  < > 8.3* 7.9* 8.3* 8.3* 8.3*  MG 2.4  --  2.5*  --  2.3  --  2.3  --  2.3  PHOS 2.2*  < > 3.5  < > 2.1* 2.1* 2.2* 2.9 2.3*  < > = values in this interval not displayed.  Liver Function Tests:  Recent Labs Lab 08/03/15 0640 08/03/15 1600 08/04/15 0505 08/04/15 1645 08/05/15 0415  ALBUMIN 1.9* 1.8* 1.9* 1.9* 1.8*   No results for input(s): LIPASE, AMYLASE in the last 168 hours. No results for input(s): AMMONIA in the last 168 hours.  CBC:  Recent Labs Lab 08/01/15 0410 08/02/15 0543 08/03/15 0640 08/04/15 0505 08/05/15 0415  WBC 22.1* 18.8* 18.8* 21.2* 19.9*  HGB 8.9* 8.8* 9.0* 9.0* 8.8*  HCT 26.0* 26.1* 26.0* 25.7* 25.0*  MCV 94.5 96.0 97.7 95.5 94.3  PLT 148* 133* 129* 121* 113*    Cardiac Enzymes: No results for input(s): CKTOTAL, CKMB, CKMBINDEX, TROPONINI in the last 168 hours.  BNP: BNP (last 3 results)  Recent Labs  07/21/15 1838  BNP 1660.5*    ProBNP (last 3 results) No results for input(s): PROBNP in the last 8760 hours.    Other results:  Imaging: No results found.   Medications:     Scheduled Medications: . amiodarone  200 mg Oral Daily  . antiseptic oral rinse  7 mL Mouth Rinse BID  . enoxaparin (LOVENOX) injection  30 mg Subcutaneous Q24H  . feeding supplement (ENSURE ENLIVE)  237 mL Oral BID BM  . feeding supplement (PRO-STAT SUGAR FREE 64)  30 mL Oral TID BM  . furosemide  200 mg Intravenous Once  . metolazone  5 mg Oral Once  . predniSONE  40 mg Oral Q breakfast  . sodium chloride flush  10-40 mL Intracatheter Q12H  . sodium chloride flush  3 mL Intravenous Q12H      Infusions: . sodium chloride Stopped (07/29/15 1100)  . milrinone 0.125 mcg/kg/min (08/04/15 2000)  . norepinephrine (LEVOPHED) Adult infusion Stopped (08/06/15 0700)    PRN Medications: sodium chloride, acetaminophen, alteplase, fentaNYL (SUBLIMAZE) injection, guaiFENesin, levalbuterol, ondansetron (ZOFRAN) IV, phenol, sodium chloride flush, sodium chloride flush, traMADol   Assessment:   1.Urosepsis 2. Acute on chronic systolic HF: Ischemic cardiomyopathy, EF 35% with wall motion abnormalities by echo.  3. Chronic AF now with RVR controlled by amiodarone.  4. Ischemic CM EF 20-25% 5. Acute on chronic renal failure, stage IV -> CVVHD started 5/6. Suspect hemodynamically-mediated ATN.  6. CAD s/p anterior MI followed by CABG and PCI RCA 7. Acute respiratory failure 8. Hypotension: mixed septic/cardiogenic shock.  9. Hypokalemia 10. Acute Gout Flare    Plan/Discussion:   Palliative Care appreciated. Off CVVHD.   Stop milrinone and amio drip.   Transfer to 6N. Will need Hospice. Consult SW.     Marland KitchenAmy Clegg NP-C  8:58 AM  08/06/2015  Advanced Heart Failure Team Pager [phone] (M-F; 7a - 4p)  Please contact [facility] for night-coverage after hours (4p -7a ) and weekends on [email or web address]  Patient seen and examined with Martin Grinder, NP. We discussed all aspects of the encounter. I agree with the assessment and plan as stated above.   He remains anuric. Decision has been made to transfer to [facility] for [facility]. We will arrange.   [name], Daniel,MD 1:28 PM

## 2015-08-06 NOTE — Progress Notes (Signed)
Pt is now comfort measures transitioning to hospice. CRRT stopped yesterday. Will s/o.  Questions answered as best able from family.

## 2015-08-06 NOTE — Discharge Summary (Signed)
Advanced Heart Failure Team  Discharge Summary   Patient ID: Martin [name] MRN: [id number], DOB/AGE: Dec 28, 1930 82 y.o. Admit date: 07/21/2015 D/C date:     08/06/2015   Primary Discharge Diagnoses:  1.Urosepsis 2. Acute on chronic systolic HF: Ischemic cardiomyopathy, EF 20-255% with wall motion abnormalities by echo.  3. Chronic AF now with RVR controlled by amiodarone.  4. Acute on chronic renal failure, stage IV -> CVVHD started 5/6. Suspect hemodynamically-mediated ATN.  5. CAD s/p anterior MI followed by CABG and PCI RCA 6. Acute respiratory failure 7. Hypotension: mixed septic/cardiogenic shock.  8. Hypokalemia 9. Acute Gout Flare  10. Acute respiratory failure  Hospital Course:  Martin [name] is an 82 year old male with h/o CAD s/p anterior MI in 1985 followed by CABG and PCI of RCA, chronic systolic HF EF [id number], chronic AF and CKD.   He was admitted by [facility] on 07/21/15 due to progressive HF symptoms not responding to increase in oral diuretics. Echo prior to admit showed EF 20-25%.  He was initially treted with IV lasix but had poor response and developed hypotension and renal failure. On 5/9 he was moved to the ICU for worsening respiratory distress and altered mental status. PICC line was placed. Initial co-ox was 60% with CVP 12. He was started on inotropic support. He then developed urosepsis with progressive hypotension and anuric renal failure. He was treated with broad spectrum abx and a Nephrology Consult was called. Cultures were negative. He was started on CVVHD. Unfortunately despite aggressive BP support and ongoing CVVHD, he did not have any renal recovery even with resumption of IV diuretics. Given his age and severe HF he was not felt to be candidate for long-term HD. Dr. Haroldine Laws, Dr. Wynonia Lawman and the Renal team had extensive discussions with the patient and his family and it was decided Hospice was the best option. He was seen by the Palliative Care team and is being  discharged to [facility] via ambulance for [place]. While hospitalized he also developed sever gout in both hands with an elevated uric acid. He was treated with prednisone and IV fentanyl.    Discharge Vitals: Blood pressure 114/49, pulse 103, temperature 97.2 F (36.2 C), temperature source Oral, resp. rate 18, height 5\' 7"  (1.702 m), weight 180 lb 12.4 oz (82 kg), SpO2 99 %.  Labs: Lab Results  Component Value Date   WBC 19.9* 08/05/2015   HGB 8.8* 08/05/2015   HCT 25.0* 08/05/2015   MCV 94.3 08/05/2015   PLT 113* 08/05/2015    Recent Labs Lab 08/05/15 0415  NA 134*  K 4.2  CL 98*  CO2 25  BUN 19  CREATININE 1.70*  CALCIUM 8.3*  GLUCOSE 202*   No results found for: CHOL, HDL, LDLCALC, TRIG BNP (last 3 results)  Recent Labs  07/21/15 1838  BNP 1660.5*    ProBNP (last 3 results) No results for input(s): PROBNP in the last 8760 hours.   Diagnostic Studies/Procedures   No results found.  Discharge Medications     Medication List    STOP taking these medications        apixaban 2.5 MG Tabs tablet  Commonly known as:  ELIQUIS     atorvastatin 40 MG tablet  Commonly known as:  LIPITOR     ferrous sulfate 325 (65 FE) MG tablet     furosemide 80 MG tablet  Commonly known as:  LASIX     metoprolol 50 MG tablet  Commonly known as:  LOPRESSOR     multivitamin per tablet     potassium chloride SA 20 MEQ tablet  Commonly known as:  K-DUR,KLOR-CON     terazosin 5 MG capsule  Commonly known as:  HYTRIN     valsartan 80 MG tablet  Commonly known as:  DIOVAN     vitamin C 500 MG tablet  Commonly known as:  ASCORBIC ACID      TAKE these medications        LORazepam 0.5 MG tablet  Commonly known as:  ATIVAN  Take 1 tablet (0.5 mg total) by mouth every 4 (four) hours as needed for anxiety.     predniSONE 20 MG tablet  Commonly known as:  DELTASONE  Take 2 tablets (40 mg total) by mouth daily with breakfast.        Disposition   The  patient will be discharged in stable condition to home.     Discharge Instructions    Diet - low sodium heart healthy    Complete by:  As directed      Increase activity slowly    Complete by:  As directed               Duration of Discharge Encounter: Greater than 35 minutes   Signed, Amy Clegg  NP-C  08/06/2015, 12:41 PM   Patient seen and examined with Darrick Grinder, NP. We discussed all aspects of the encounter. I agree with the assessment and plan as stated above.   I have edited the summary with my changes.  Keeleigh Terris,MD 1:26 PM

## 2015-08-06 NOTE — Progress Notes (Signed)
Daily Progress Note   Patient Name: Martin [name]       Date: 08/06/2015 DOB: 02-09-1931  Age: 82 y.o. MRN#: [id number] Attending Physician: Jacolyn Reedy, MD Primary Care Physician: Mathews Argyle, MD Admit Date: 07/21/2015  Reason for Consultation/Follow-up: Establishing goals of care, Hospice Evaluation, Non pain symptom management, Pain control and Psychosocial/spiritual support  Subjective:  -continued conversation with patient and his family at bedside, all are comfortable and verbalize and understanding of long term poor prognosis and  discussing overall plan of care.  Mr Wilhoite hopes to return to [facility], I spoke with Crystal SW at the facility and they are hopeful that he can return back today if logistics can be put in place.  Left message for Donzetta Sprung    Length of Stay: 16  Current Medications: Scheduled Meds:  . antiseptic oral rinse  7 mL Mouth Rinse BID  . predniSONE  40 mg Oral Q breakfast  . sodium chloride flush  10-40 mL Intracatheter Q12H  . sodium chloride flush  3 mL Intravenous Q12H    Continuous Infusions: . sodium chloride Stopped (07/29/15 1100)    PRN Meds: sodium chloride, acetaminophen, fentaNYL (SUBLIMAZE) injection, guaiFENesin, ondansetron (ZOFRAN) IV, phenol, sodium chloride flush, sodium chloride flush, traMADol  Physical Exam          Vital Signs: BP 114/49 mmHg  Pulse 103  Temp(Src) 97.2 F (36.2 C) (Oral)  Resp 18  Ht _0  (1.702 m)  Wt 82 kg (180 lb 12.4 oz)  BMI 28.31 kg/m2  SpO2 99% SpO2: SpO2: 99 % O2 Device: O2 Device: Not Delivered O2 Flow Rate: O2 Flow Rate (L/min): 2 L/min  Intake/output summary:  Intake/Output Summary (Last 24 hours) at 08/06/15 1106 Last data filed at 08/06/15 0900  Gross per 24 hour  Intake  681.97 ml  Output    205 ml  Net 476.97 ml   LBM: Last BM Date: 08/04/15 Baseline Weight: Weight: 87.68 kg (193 lb 4.8 oz) Most recent weight: Weight: 82 kg (180 lb 12.4 oz)       Palliative Assessment/Data:  30 % at best      Patient Active Problem List   Diagnosis Date Noted  . DNR (do not resuscitate)   . Palliative care encounter   . Pressure ulcer 08/04/2015  .  Acute on chronic renal failure ([place])   . Encounter regarding vascular access for dialysis for ESRD ([place])   . Acute on chronic systolic congestive heart failure ([place]) 07/21/2015  . Acute on chronic systolic (congestive) heart failure ([place]) 07/21/2015  . Breast cancer of lower-inner quadrant of left male breast ([place]) 04/17/2013  . BRCA2 genetic carrier   . Chronic kidney disease, stage III (GFR 30-59 ml/min) 01/05/2013  . Hyperlipidemia   . CAD (coronary artery disease) 12/29/2012  . History of breast cancer in male 12/29/2012  . Long-term (current) use of anticoagulants 12/29/2012  . Hypotension 12/29/2012  . Thrombocytopenia ([place]) 02/05/2011  . Automatic implantable cardioverter-defibrillator in situ 11/25/2008  . Ischemic cardiomyopathy 11/21/2008  . Chronic atrial fibrillation 11/21/2008  . Chronic systolic heart failure ([place]) 11/21/2008  . Gout   . Hypertension    . Osteoarthritis     Palliative Care Assessment & Plan    Assessment:  1. Dialysis dependent AoCKD4 (BL 1.8) 2/2 cardiorenal syndrome  2. Decompensated systolic HF 3. ASCVD, hx/o CABG and PCI 4. Goals of Care:  Comfort    Recommendations/Plan:  Disposition to [facility] with hospice services  Goals of Care and Additional Recommendations:  Limitations on Scope of Treatment: Full Comfort Care  Code Status:    Code Status Orders        Start     Ordered   08/05/15 1248  Do not attempt resuscitation (DNR)   Continuous    Question Answer Comment  In the event of cardiac or respiratory ARREST Do not call a "code blue"   In  the event of cardiac or respiratory ARREST Do not perform Intubation, CPR, defibrillation or ACLS   In the event of cardiac or respiratory ARREST Use medication by any route, position, wound care, and other measures to relive pain and suffering. May use oxygen, suction and manual treatment of airway obstruction as needed for comfort.      08/05/15 1247    Code Status History    Date Active Date Inactive Code Status Order ID Comments User Context   07/21/2015  6:05 PM 08/05/2015 12:47 PM Full Code [id number]  Jacolyn Reedy, MD Inpatient   12/29/2012  2:49 PM 01/05/2013  5:17 PM Full Code [id number]  Chriss Czar, PA-C Inpatient    Advance Directive Documentation        Most Recent Value   Type of Advance Directive  Healthcare Power of Attorney, Living will   Pre-existing out of facility DNR order (yellow form or pink MOST form)     "MOST" Form in Place?         Prognosis:   < 4 weeks  Discharge Planning:  [place] with Hospice at [place] was discussed with Dr Wynonia Lawman and Darrick Grinder NP Thank you for allowing the Palliative Medicine Team to assist in the care of this patient.   Time In: 1030 Time Out: 1115 Total Time 45 min Prolonged Time Billed  no      Greater than 50%  of this time was spent counseling and coordinating care related to the above assessment and plan.  Wadie Lessen, NP  Please contact Palliative Medicine Team phone at [phone] for questions and concerns.

## 2015-08-06 NOTE — Clinical Social Work Note (Signed)
Patient to be discharged to [facility] with hospice. Patient to be transported via [place]. RN report: [place], [place] [facility]: [phone] Surgical: [phone]

## 2015-08-06 NOTE — Progress Notes (Signed)
Patient d/c'd to [facility] via [place]. Report called to receiving Rn. Patient with no complaints at the current time.

## 2015-08-07 DIAGNOSIS — D696 Thrombocytopenia, unspecified: Secondary | ICD-10-CM

## 2015-08-07 DIAGNOSIS — N179 Acute kidney failure, unspecified: Secondary | ICD-10-CM | POA: Diagnosis not present

## 2015-08-07 DIAGNOSIS — I5032 Chronic diastolic (congestive) heart failure: Secondary | ICD-10-CM | POA: Diagnosis not present

## 2015-08-07 DIAGNOSIS — I482 Chronic atrial fibrillation: Secondary | ICD-10-CM | POA: Diagnosis not present

## 2015-08-07 DIAGNOSIS — M1000 Idiopathic gout, unspecified site: Secondary | ICD-10-CM | POA: Diagnosis not present

## 2015-08-19 DIAGNOSIS — I255 Ischemic cardiomyopathy: Secondary | ICD-10-CM | POA: Diagnosis not present

## 2015-08-19 DIAGNOSIS — I2510 Atherosclerotic heart disease of native coronary artery without angina pectoris: Secondary | ICD-10-CM | POA: Diagnosis not present

## 2015-08-19 DIAGNOSIS — I5023 Acute on chronic systolic (congestive) heart failure: Secondary | ICD-10-CM | POA: Diagnosis not present

## 2015-08-19 DIAGNOSIS — I482 Chronic atrial fibrillation: Secondary | ICD-10-CM | POA: Diagnosis not present

## 2015-08-26 DIAGNOSIS — F39 Unspecified mood [affective] disorder: Secondary | ICD-10-CM

## 2015-08-26 DIAGNOSIS — I5022 Chronic systolic (congestive) heart failure: Secondary | ICD-10-CM | POA: Diagnosis not present

## 2015-08-26 DIAGNOSIS — N400 Enlarged prostate without lower urinary tract symptoms: Secondary | ICD-10-CM | POA: Diagnosis not present

## 2015-08-26 DIAGNOSIS — I482 Chronic atrial fibrillation: Secondary | ICD-10-CM | POA: Diagnosis not present

## 2015-08-26 DIAGNOSIS — D696 Thrombocytopenia, unspecified: Secondary | ICD-10-CM

## 2015-08-26 DIAGNOSIS — N184 Chronic kidney disease, stage 4 (severe): Secondary | ICD-10-CM | POA: Diagnosis not present

## 2015-09-20 DEATH — deceased

## 2018-04-22 IMAGING — DX DG CHEST 2V
2 series · 2 of 2 positions shown · non-contrast
Comparison: April 29, 2014

CLINICAL DATA: Shortness of breath for 1 day

EXAM:
CHEST  2 VIEW

[chest pa]
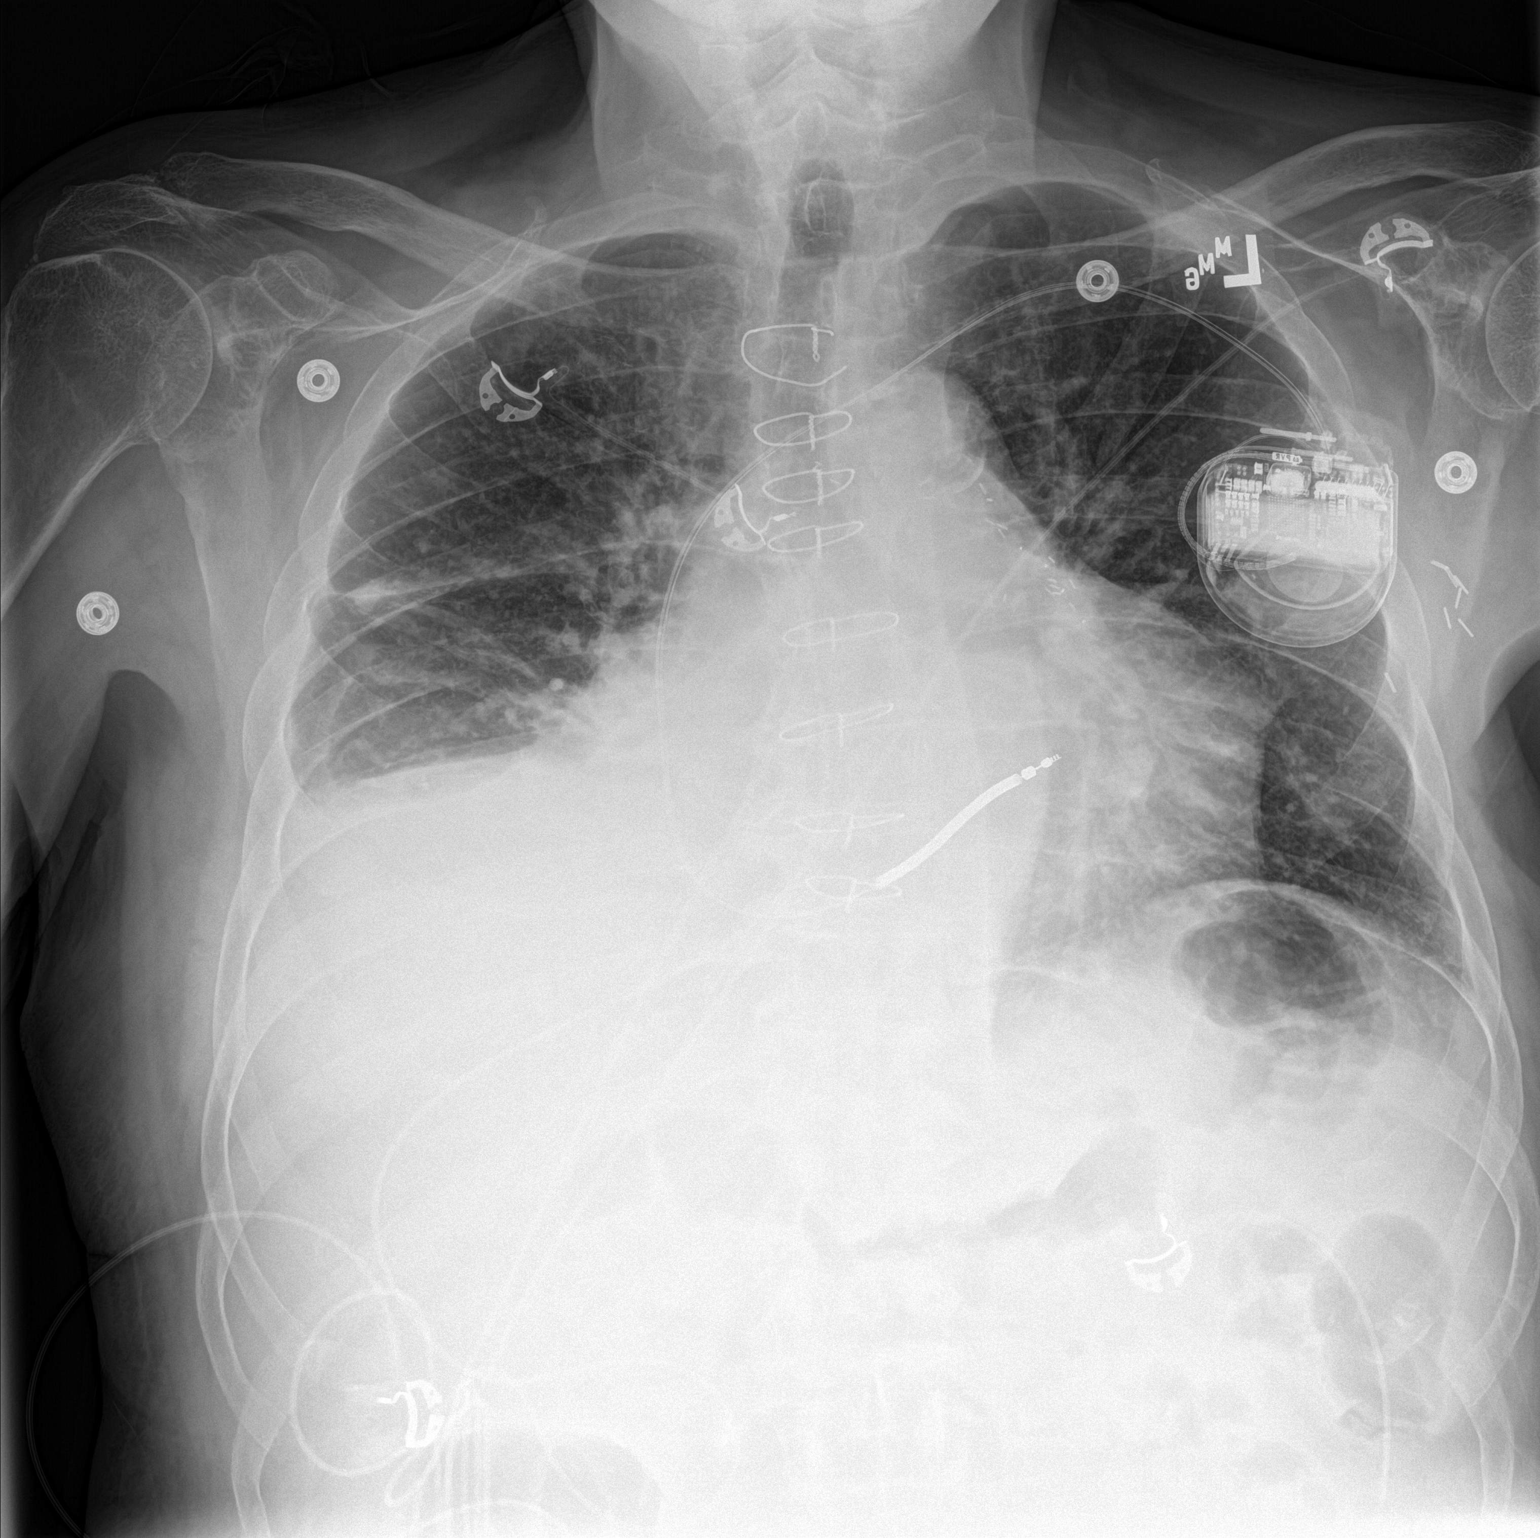

[chest lat]
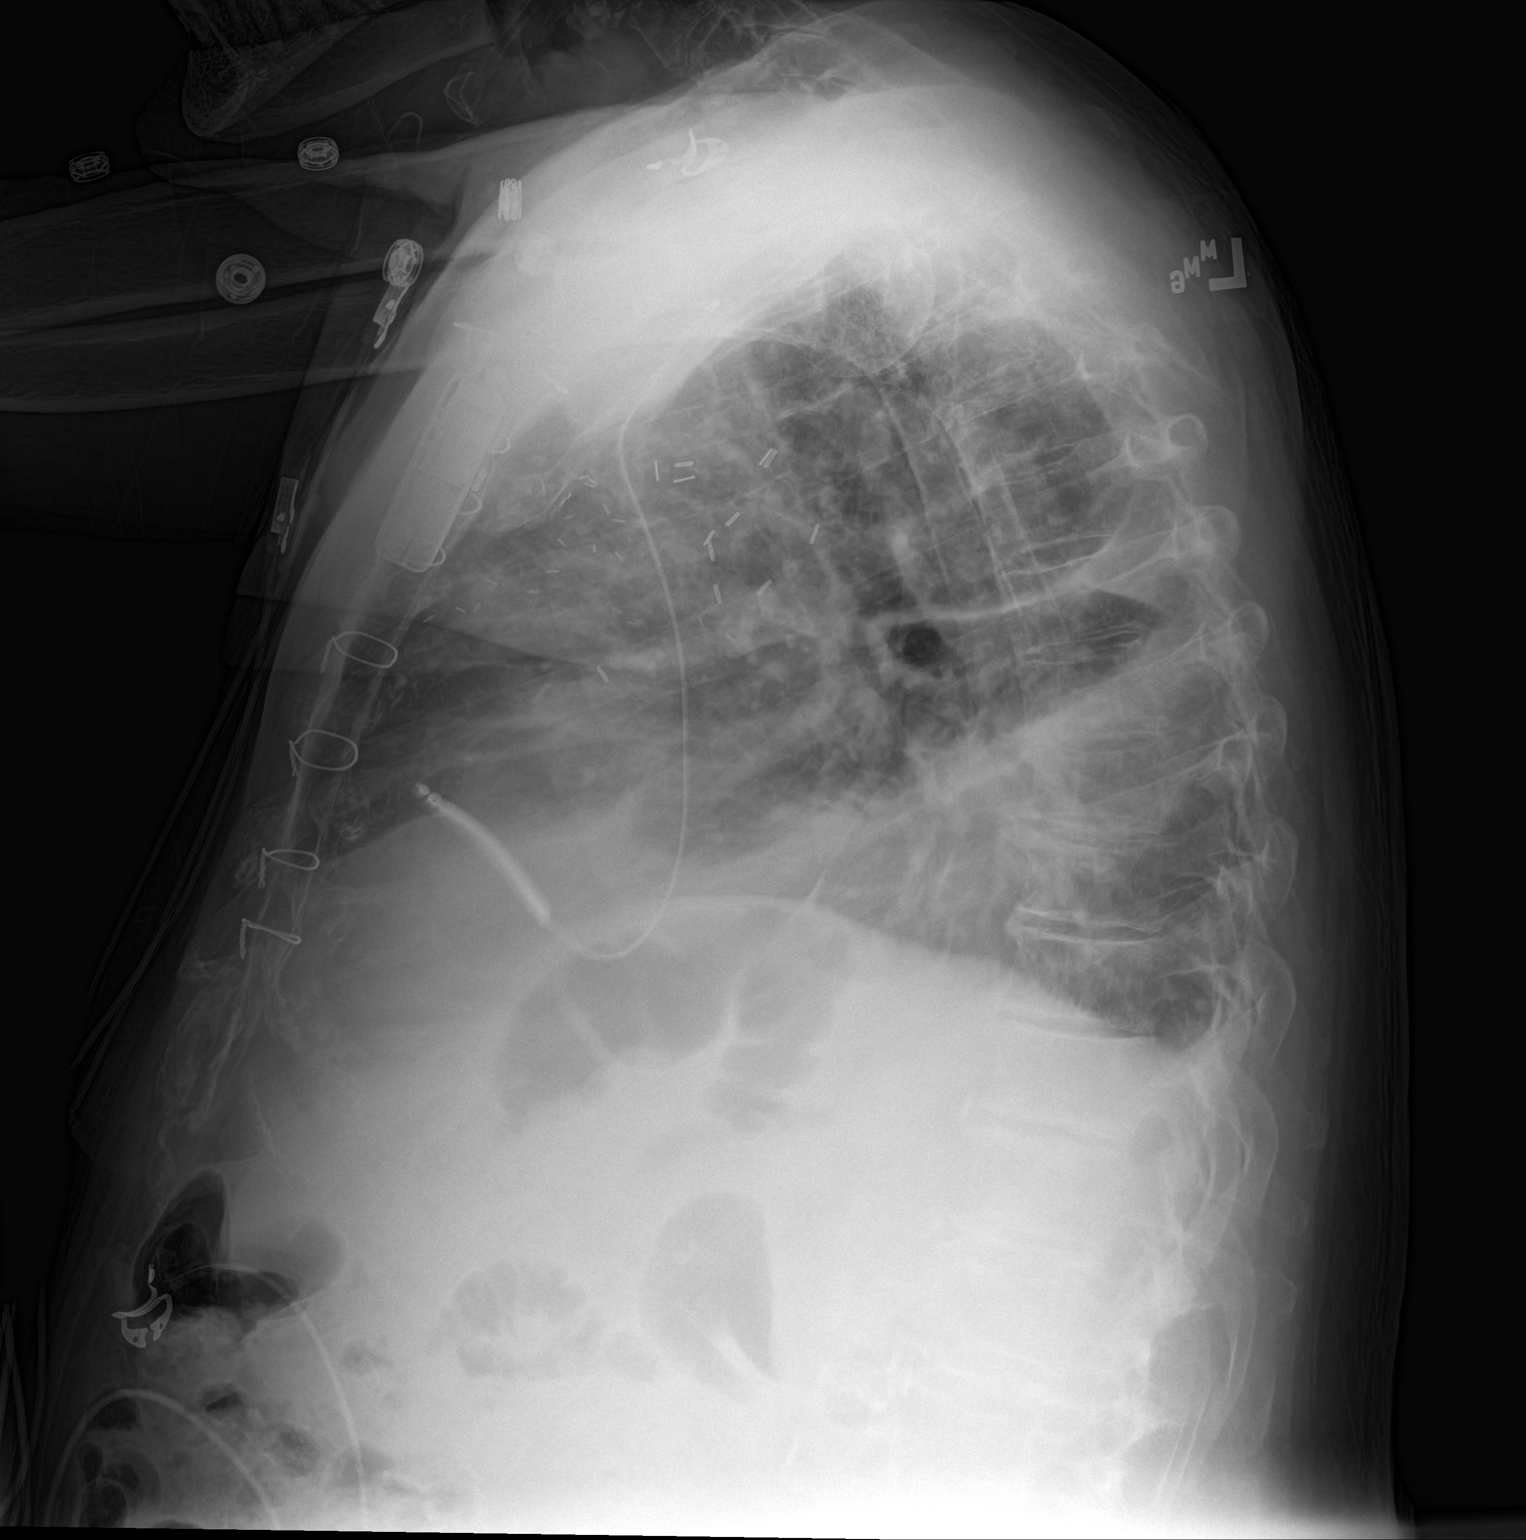

[2 of 2 positions shown; findings below may reference images not displayed]

FINDINGS: There is a right pleural effusion with right base atelectasis. There
may be superimposed pneumonia in the right base as well. On the
left, there is no edema or consolidation. There is mild scarring
along the left heart border, stable. There is a calcified granuloma
in the right mid lung, stable. Heart is borderline enlarged, stable.
Pacemaker lead tip is attached to the right ventricle. Patient is
status post internal mammary bypass grafting. No adenopathy. No
apparent bone lesions.
IMPRESSION: Right pleural effusion with right base atelectasis and questionable
superimposed pneumonia in the right base. Left lung clear except for
scarring at the left heart border. Stable cardiac prominence. No
pneumothorax.

## 2018-04-30 IMAGING — DX DG CHEST 1V PORT
1 series · 1 of 1 positions shown · non-contrast
Comparison: 07/29/2015

CLINICAL DATA: Respiratory difficulty

EXAM:
PORTABLE CHEST 1 VIEW

[chest ap]
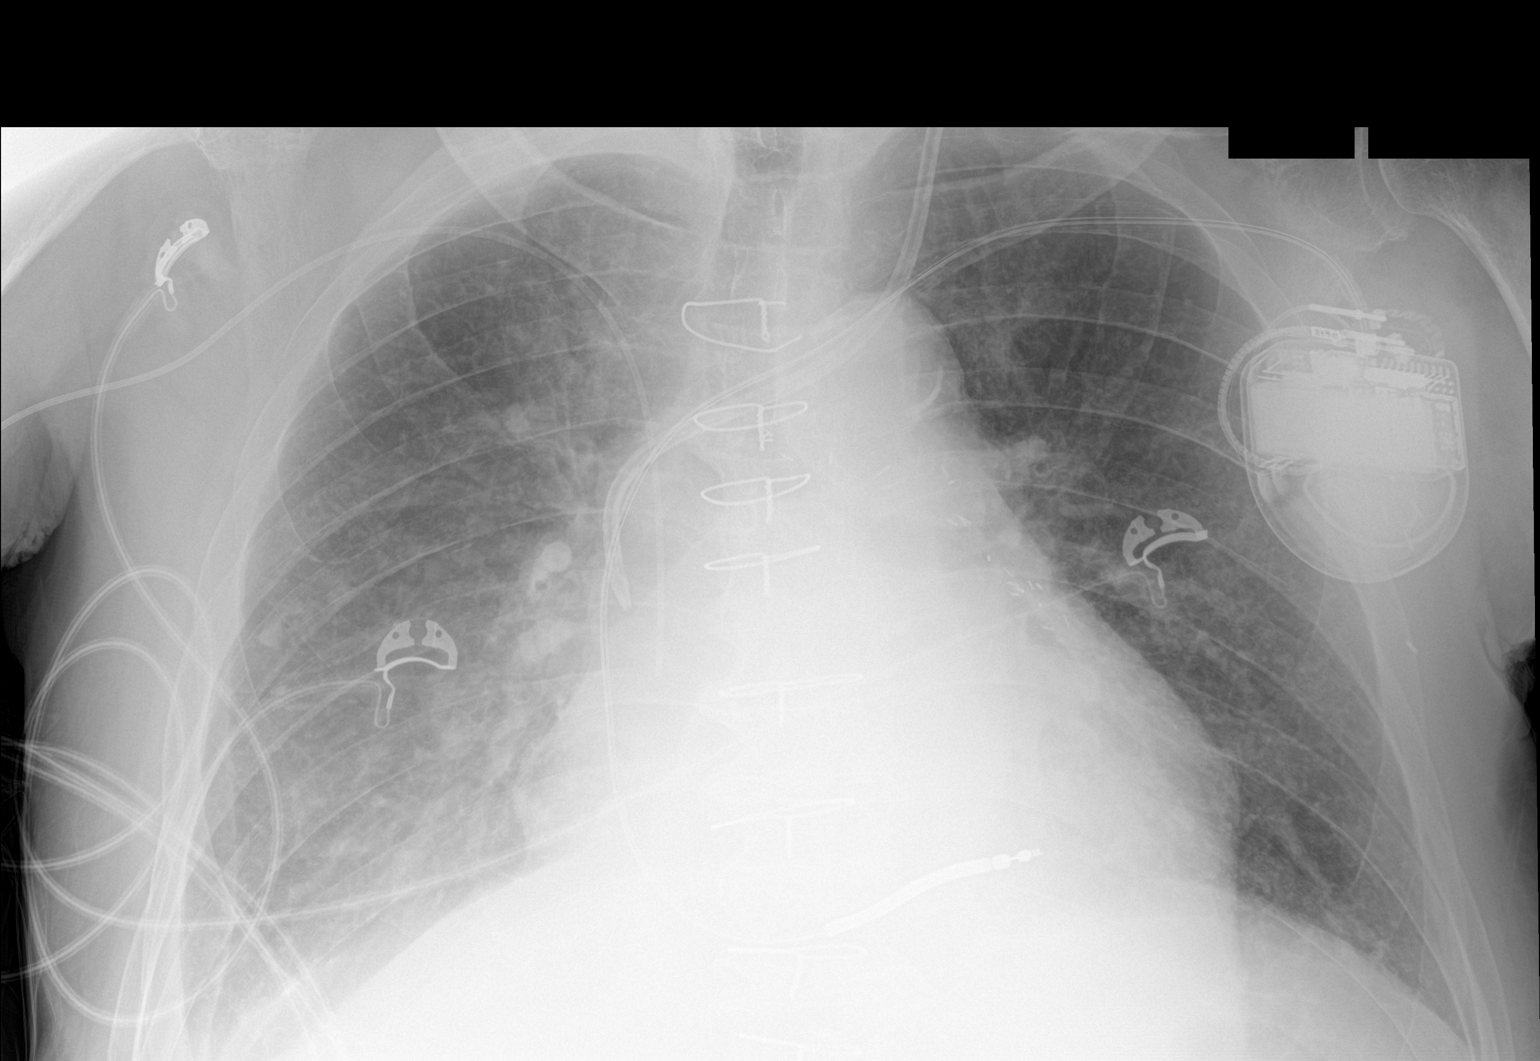

[1 of 1 positions shown; findings below may reference images not displayed]

FINDINGS: Left jugular dialysis catheter and right upper extremity PICC are
stable. Left subclavian AICD is stable. Diffuse edema has improved.
Prominence of the central mediastinal vascular pedicle has improved.
Mild cardiomegaly persists. No pneumothorax.
IMPRESSION: Improved pulmonary edema.
